# Patient Record
Sex: Female | Born: 1965 | Race: White | Hispanic: No | Smoking: Former smoker
Health system: Southern US, Community
[De-identification: ages and names within clinical notes are randomized; demographics above are authoritative.]

## PROBLEM LIST (undated history)

## (undated) DIAGNOSIS — Z6824 Body mass index (BMI) 24.0-24.9, adult: Secondary | ICD-10-CM

## (undated) DIAGNOSIS — G35D Multiple sclerosis, unspecified: Secondary | ICD-10-CM

## (undated) DIAGNOSIS — F329 Major depressive disorder, single episode, unspecified: Secondary | ICD-10-CM

## (undated) DIAGNOSIS — G35 Multiple sclerosis: Secondary | ICD-10-CM

## (undated) DIAGNOSIS — F32A Anxiety disorder, unspecified: Secondary | ICD-10-CM

## (undated) DIAGNOSIS — F419 Anxiety disorder, unspecified: Secondary | ICD-10-CM

## (undated) HISTORY — PX: TONSILLECTOMY AND ADENOIDECTOMY: SHX28

## (undated) HISTORY — DX: Major depressive disorder, single episode, unspecified: F32.9

## (undated) HISTORY — DX: Multiple sclerosis, unspecified: G35.D

## (undated) HISTORY — DX: Multiple sclerosis: G35

## (undated) HISTORY — DX: Anxiety disorder, unspecified: F41.9

## (undated) HISTORY — DX: Body mass index (BMI) 24.0-24.9, adult: Z68.24

## (undated) HISTORY — DX: Depression, unspecified: F32.A

---

## 2002-11-26 ENCOUNTER — Encounter: Payer: Self-pay | Admitting: Emergency Medicine

## 2002-11-26 ENCOUNTER — Observation Stay (HOSPITAL_COMMUNITY): Admission: EM | Admit: 2002-11-26 | Discharge: 2002-11-27 | Payer: Self-pay | Admitting: Emergency Medicine

## 2003-09-20 ENCOUNTER — Ambulatory Visit (HOSPITAL_COMMUNITY): Admission: RE | Admit: 2003-09-20 | Discharge: 2003-09-20 | Payer: Self-pay | Admitting: Psychiatry

## 2003-11-07 ENCOUNTER — Ambulatory Visit (HOSPITAL_COMMUNITY): Admission: RE | Admit: 2003-11-07 | Discharge: 2003-11-07 | Payer: Self-pay | Admitting: Internal Medicine

## 2003-12-19 ENCOUNTER — Encounter (HOSPITAL_COMMUNITY): Admission: RE | Admit: 2003-12-19 | Discharge: 2004-01-18 | Payer: Self-pay | Admitting: Orthopedic Surgery

## 2004-01-21 ENCOUNTER — Encounter (HOSPITAL_COMMUNITY): Admission: RE | Admit: 2004-01-21 | Discharge: 2004-02-20 | Payer: Self-pay | Admitting: Orthopedic Surgery

## 2004-09-11 ENCOUNTER — Ambulatory Visit (HOSPITAL_COMMUNITY): Admission: AD | Admit: 2004-09-11 | Discharge: 2004-09-11 | Payer: Self-pay | Admitting: Obstetrics and Gynecology

## 2004-09-28 ENCOUNTER — Inpatient Hospital Stay (HOSPITAL_COMMUNITY): Admission: RE | Admit: 2004-09-28 | Discharge: 2004-10-01 | Payer: Self-pay | Admitting: Obstetrics and Gynecology

## 2007-09-30 ENCOUNTER — Ambulatory Visit: Payer: Self-pay | Admitting: Family Medicine

## 2007-09-30 LAB — CONVERTED CEMR LAB: Rapid Strep: NEGATIVE

## 2007-10-14 ENCOUNTER — Ambulatory Visit: Payer: Self-pay | Admitting: Family Medicine

## 2007-10-14 DIAGNOSIS — J309 Allergic rhinitis, unspecified: Secondary | ICD-10-CM | POA: Insufficient documentation

## 2008-09-11 ENCOUNTER — Emergency Department (HOSPITAL_COMMUNITY): Admission: EM | Admit: 2008-09-11 | Discharge: 2008-09-11 | Payer: Self-pay | Admitting: Emergency Medicine

## 2009-09-11 ENCOUNTER — Ambulatory Visit (HOSPITAL_COMMUNITY): Payer: Self-pay | Admitting: Neurology

## 2009-09-11 ENCOUNTER — Encounter (HOSPITAL_COMMUNITY): Admission: RE | Admit: 2009-09-11 | Discharge: 2009-10-11 | Payer: Self-pay | Admitting: Neurology

## 2010-07-16 ENCOUNTER — Encounter: Admission: RE | Admit: 2010-07-16 | Discharge: 2010-07-16 | Payer: Self-pay | Admitting: Internal Medicine

## 2010-07-30 ENCOUNTER — Encounter
Admission: RE | Admit: 2010-07-30 | Discharge: 2010-07-30 | Payer: Self-pay | Source: Home / Self Care | Attending: Internal Medicine | Admitting: Internal Medicine

## 2010-09-07 ENCOUNTER — Encounter: Payer: Self-pay | Admitting: Internal Medicine

## 2010-11-14 ENCOUNTER — Ambulatory Visit (INDEPENDENT_AMBULATORY_CARE_PROVIDER_SITE_OTHER): Payer: Medicare PPO | Admitting: Urology

## 2010-11-14 DIAGNOSIS — N390 Urinary tract infection, site not specified: Secondary | ICD-10-CM

## 2010-11-14 DIAGNOSIS — N3941 Urge incontinence: Secondary | ICD-10-CM

## 2010-11-14 DIAGNOSIS — N3944 Nocturnal enuresis: Secondary | ICD-10-CM

## 2010-11-14 DIAGNOSIS — R339 Retention of urine, unspecified: Secondary | ICD-10-CM

## 2010-12-05 ENCOUNTER — Ambulatory Visit (INDEPENDENT_AMBULATORY_CARE_PROVIDER_SITE_OTHER): Payer: Medicare PPO | Admitting: Urology

## 2010-12-05 DIAGNOSIS — N319 Neuromuscular dysfunction of bladder, unspecified: Secondary | ICD-10-CM

## 2010-12-05 DIAGNOSIS — N3941 Urge incontinence: Secondary | ICD-10-CM

## 2010-12-05 DIAGNOSIS — N3944 Nocturnal enuresis: Secondary | ICD-10-CM

## 2010-12-05 DIAGNOSIS — N390 Urinary tract infection, site not specified: Secondary | ICD-10-CM

## 2011-01-02 NOTE — H&P (Signed)
Erin Berg, Erin Berg              ACCOUNT NO.:  [id number]   MEDICAL RECORD NO.:  [id number]          PATIENT TYPE:  INP   LOCATION:  LDRP                          FACILITY:  [facility]   PHYSICIAN:  Tilda Burrow, M.D. DATE OF BIRTH:  [birth year]   DATE OF ADMISSION:  09/28/2004  DATE OF DISCHARGE:  LH                                HISTORY & PHYSICAL   ADMISSION DIAGNOSES:  1.  Pregnancy, [redacted] weeks gestation.  2.  Cervical favorability.  3.  Multiple sclerosis with subsequent exhaustion in pregnancy.   HISTORY OF PRESENT ILLNESS:  This is a 45 year old female, gravida 3, para  2, AB 0, LMP of January 20, 2004, placing menstrual EDC of October 28, 2004, with  ultrasound corrected [facility] of October 03, 2004, based on 20-week ultrasound.  She was admitted at [redacted] weeks gestation by that criteria after the pregnancy  course was followed through our office and notable for blood type O  negative, receiving RhoGAM at 28 weeks, rubella immunity present, hemoglobin  12, hematocrit 39 and hepatis, HIV, GC, chlamydia and RPR all negative.  The  patient was admitted for labor management and induction with Foley bulb  ripening of the cervix on Sunday night and Pitocin induction in a.m.   PRENATAL PREGNANCY COURSE:  Two 12-hour deliveries in the past.  She was  induced with her second child in 61.   PAST MEDICAL HISTORY:  Multiple sclerosis with initial diagnosis in 1996 and  flare up in 1998 and 2004.   PAST SURGICAL HISTORY:  Tonsillectomy in the first grade.   ALLERGIES:  SULFA causes a rash, not severe in nature.   PHYSICAL EXAMINATION:  GENERAL APPEARANCE:  A healthy-appearing Caucasian  female with obvious gait abnormalities related to her multiple sclerosis.  HEENT:  Pupils equal, round and reactive.  Extraocular movements intact.  NECK:  Supple.  ABDOMEN:  41 cm fundal height.  Vertex presentation.  Estimated fetal weight  8 pounds.  PELVIC:  Cervix 1 cm, 20%, soft, -2, vertex and well  applied.   PLAN:  Balloon cervical ripening on September 28, 2004, and Pitocin induction  on September 29, 2004.      JVF/MEDQ  D:  09/23/2004  T:  09/23/2004  Job:  [id number]   cc:   Corrie Mckusick, M.D.  Fax: [phone]

## 2011-01-02 NOTE — Op Note (Signed)
Erin Berg, Erin Berg NO.:  [id number]   MEDICAL RECORD NO.:  [id number]          PATIENT TYPE:  INP   LOCATION:  LDR1                          FACILITY:  [facility]   PHYSICIAN:  Tilda Burrow, M.D. DATE OF BIRTH:  05-29-1966   DATE OF PROCEDURE:  09/29/2004  DATE OF DISCHARGE:                                 OPERATIVE REPORT   1.  Onset of labor September 29, 2004, 9 a.m.  2.  Date of delivery September 29, 2004, at 1430.  3.  Length of first stage of labor 5 hours and 15 minutes.  4.  Length of second stage of labor 15 minutes.  5.  Length of third stage labor 10 minutes.   DELIVERY NOTE:  [name] had a vacuum-assisted delivery with a Kiwi VAC to  the green marker x 2 uterine contractions due to the fact that she has  multiple sclerosis and has inability to push x 2 contractions with  spontaneous delivery of head without difficulty.  Upon delivery of head,  there was spontaneous delivery of shoulders without complications.  On  delivery of infant, cord was clamped, suctioned thoroughly.  Cord was cut,  infant dried, and placed on mother's abdomen for infant care.  Apgars were  9/9.  Upon inspection, there was a second degree perineal laceration noted  which was infiltrated with 20 mL 1% lidocaine as local anesthetic.  Third  stage of labor was actually managed with 20 units of Pitocin 1000 mL D5  lactated Ringers at a rapid rate.  Placenta was delivered spontaneously via  _crede- mechanism.  Upon inspection, membranes were noted to be intact.  Three-vessel cord noted.  Perineum was repaired with two interrupted sutures  and closed with subcuticular, using 2-0 Vicryl for both.  Good hemostasis  was obtained.  Estimated blood loss was approximately 400 mL  One ampule of  Hemabate was given for uterine atony with good uterine tone obtained and  lochia was small to moderate with good tone.  Infant and mother both  stabilized in good condition, transferred out the post  floor unit.      DL/MEDQ  D:  16/05/9603  T:  09/29/2004  Job:  [id number]

## 2011-01-02 NOTE — H&P (Signed)
Erin Berg, Erin Berg NO.:  [id number]   MEDICAL RECORD NO.:  [id number]          PATIENT TYPE:  INP   LOCATION:  LDR1                          FACILITY:  [facility]   PHYSICIAN:  Tilda Burrow, M.D. DATE OF BIRTH:  [birth year]   DATE OF ADMISSION:  09/28/2004  DATE OF DISCHARGE:  LH                                HISTORY & PHYSICAL   ADMISSION DIAGNOSIS:  1.  Pregnancy at 35-1/2 weeks' gestation.  2.  Elective induction of labor at patient request.   HISTORY OF PRESENT ILLNESS:  This 45 year old female, gravida 3, para 2, AB0  with pregnancy notable for increased discomfort and gait limitations  complicating the pregnancy due to her multiple sclerosis and depression is  admitted at 39-1/2 weeks' gestation for induction of labor.  She has been  seen in our office through the pregnancy with straight forward pregnancy  course with appropriate weight gain of 19 pounds, normal blood pressures,  normal fetal growth.  She is admitted for Foley bulb cervical ripening and  Pitocin induction of labor.  Prior pregnancy was induced using Cytotec  balloon.  Foley bulb has been placed without difficulty.  Vertex  presentation confirmed.  Fetal heart rate is trace and is reactive.  Pitocin  is planned for the a.m.   PAST MEDICAL HISTORY:  Positive for multiple sclerosis since 1996.  Flareups  in 1998 and 2004.   PAST SURGICAL HISTORY:  Tonsillectomy and adenoidectomy in the first grade.   ALLERGIES:  SULFA causing a severe rash in 1998.  PENICILLIN.   MEDICATIONS:  Prenatal vitamins and iron are taken.  She quit Betaseron  after breastfeeding.  She has taken Zoloft in the past with good effect.  Not currently on it for depression.   SOCIAL HISTORY:  Married.  Stable relationship.   PHYSICAL EXAMINATION:  GENERAL:  The physical examination reveals a healthy-  appearing Caucasian female, alert and oriented x3.  HEENT:  Pupils are equal, round and reactive.  Extraocular  movements are  intact.  NECK:  Supple.  Trachea midline.  CHEST:  Clear to auscultation.  ABDOMEN:  Nontender.  There is a 41 cm fundal height.  Estimated fetal  weight is 8 to 8-1/2 pounds.  PELVIC:  Cervix 1-2 cm, 20%, soft, -2 vertex with balloons having been  placed without difficulty.   PRENATAL LABORATORY DATA:  Blood type O negative, rubella immune.  Hemoglobin 12, hematocrit 39.  Hepatitis, HIV, RPR, GC and Chlamydia are all  negative.  MSAP normal.  Glucose challenge test 135 mg%.   PLAN:  Pitocin induction of labor to begin in the a.m.      JVF/MEDQ  D:  09/28/2004  T:  09/28/2004  Job:  [id number]   cc:   [facility]   Corrie Mckusick, M.D.  Fax: [phone]

## 2011-01-02 NOTE — Discharge Summary (Signed)
   Erin Berg, Erin Berg                          ACCOUNT NO.:  [id number]   MEDICAL RECORD NO.:  [id number]                   PATIENT TYPE:  INP   LOCATION:  A311                                 FACILITY:  [facility]   PHYSICIAN:  Patrica Duel, M.D.                 DATE OF BIRTH:  1965-09-30   DATE OF ADMISSION:  11/26/2002  DATE OF DISCHARGE:  11/27/2002                                 DISCHARGE SUMMARY   ADMISSION HISTORY AND PHYSICAL/DISCHARGE SUMMARY   DISCHARGE DIAGNOSES:  1. Paresthesias and weakness of right lower extremity probably secondary to     multiple sclerosis, completely resolved.  2. Urinary tract infection, possibly contributing to exacerbation of     multiple sclerosis.   HISTORY OF PRESENT ILLNESS:  This is a 45 year old female with an eight-year  history of multiple sclerosis.  Her major symptoms have been weakness of the  left lower extremity on an intermittent basis.   The patient presented to the emergency department after a relatively sudden  onset of weakness of the right lower extremity.  This occurred while sitting  in church.  She experienced no other neurologic deficits such as aphasia,  facial droop, upper extremity weakness, headache, nausea, vomiting,  diarrhea, chest pain, shortness of breath, or genitourinary symptoms.   The patient went to the emergency department for workup which was  essentially benign except for urinary tract infection.  The patient's  neurologist was contacted, who suggested a brief admission for two doses of  high-dose Solu-Medrol.   Of note, prior to being seen by the ER physician, the patient's symptoms had  resolved.   COURSE IN THE HOSPITAL:  The patient did well in the hospital with no  significant problems.  Her symptoms have completely resolved and her  neurologic is nonfocal.  Her physical exam is totally benign.  She has no  carotid bruits, heart murmurs, and her vital signs are stable.   The patient is strongly  requesting discharge and I feel this is appropriate.  She will be followed by neurology.   DISPOSITION:  Medications include:  1. Prednisone Dosepak 5 mg x12 days.  2. Baclofen 20 mg t.i.d.  3. Betaseron 0.3 mg subcu every-other day.                                               Patrica Duel, M.D.    MC/MEDQ  D:  11/27/2002  T:  11/27/2002  Job:  [id number]

## 2011-02-12 ENCOUNTER — Other Ambulatory Visit: Payer: Self-pay | Admitting: Neurology

## 2011-02-12 DIAGNOSIS — N319 Neuromuscular dysfunction of bladder, unspecified: Secondary | ICD-10-CM

## 2011-02-12 DIAGNOSIS — G35 Multiple sclerosis: Secondary | ICD-10-CM

## 2011-02-12 DIAGNOSIS — R269 Unspecified abnormalities of gait and mobility: Secondary | ICD-10-CM

## 2011-02-20 ENCOUNTER — Ambulatory Visit
Admission: RE | Admit: 2011-02-20 | Discharge: 2011-02-20 | Disposition: A | Discharge status: Home / Self Care | Payer: Medicare PPO | Source: Ambulatory Visit | Attending: Neurology | Admitting: Neurology

## 2011-02-20 DIAGNOSIS — R269 Unspecified abnormalities of gait and mobility: Secondary | ICD-10-CM

## 2011-02-20 DIAGNOSIS — G35 Multiple sclerosis: Secondary | ICD-10-CM

## 2011-02-20 DIAGNOSIS — N319 Neuromuscular dysfunction of bladder, unspecified: Secondary | ICD-10-CM

## 2011-02-20 MED ORDER — GADOBENATE DIMEGLUMINE 529 MG/ML IV SOLN
14.0000 mL | Freq: Once | INTRAVENOUS | Status: AC | PRN
  Administered 2011-02-20: 14 mL via INTRAVENOUS

## 2011-03-06 ENCOUNTER — Ambulatory Visit: Payer: Medicare PPO | Admitting: Urology

## 2011-04-03 ENCOUNTER — Ambulatory Visit (INDEPENDENT_AMBULATORY_CARE_PROVIDER_SITE_OTHER): Payer: Medicare PPO | Admitting: Urology

## 2011-04-03 DIAGNOSIS — N319 Neuromuscular dysfunction of bladder, unspecified: Secondary | ICD-10-CM

## 2011-04-03 DIAGNOSIS — N3944 Nocturnal enuresis: Secondary | ICD-10-CM

## 2011-04-03 DIAGNOSIS — N3941 Urge incontinence: Secondary | ICD-10-CM

## 2011-06-22 ENCOUNTER — Other Ambulatory Visit: Payer: Self-pay | Admitting: Internal Medicine

## 2011-06-22 DIAGNOSIS — Z1231 Encounter for screening mammogram for malignant neoplasm of breast: Secondary | ICD-10-CM

## 2011-07-03 ENCOUNTER — Ambulatory Visit: Payer: Medicare PPO | Admitting: Urology

## 2011-07-30 ENCOUNTER — Ambulatory Visit
Admission: RE | Admit: 2011-07-30 | Discharge: 2011-07-30 | Disposition: A | Discharge status: Home / Self Care | Payer: Medicare PPO | Source: Ambulatory Visit | Attending: Internal Medicine | Admitting: Internal Medicine

## 2011-07-30 DIAGNOSIS — Z1231 Encounter for screening mammogram for malignant neoplasm of breast: Secondary | ICD-10-CM

## 2011-08-05 ENCOUNTER — Other Ambulatory Visit: Payer: Self-pay | Admitting: Internal Medicine

## 2011-08-05 DIAGNOSIS — R928 Other abnormal and inconclusive findings on diagnostic imaging of breast: Secondary | ICD-10-CM

## 2011-08-14 ENCOUNTER — Ambulatory Visit (INDEPENDENT_AMBULATORY_CARE_PROVIDER_SITE_OTHER): Payer: Medicare PPO | Admitting: Urology

## 2011-08-14 DIAGNOSIS — N319 Neuromuscular dysfunction of bladder, unspecified: Secondary | ICD-10-CM

## 2011-08-14 DIAGNOSIS — N3941 Urge incontinence: Secondary | ICD-10-CM

## 2011-08-14 DIAGNOSIS — R339 Retention of urine, unspecified: Secondary | ICD-10-CM

## 2011-08-25 ENCOUNTER — Ambulatory Visit
Admission: RE | Admit: 2011-08-25 | Discharge: 2011-08-25 | Disposition: A | Discharge status: Home / Self Care | Payer: Medicare HMO | Source: Ambulatory Visit | Attending: Internal Medicine | Admitting: Internal Medicine

## 2011-08-25 DIAGNOSIS — R928 Other abnormal and inconclusive findings on diagnostic imaging of breast: Secondary | ICD-10-CM

## 2011-11-11 ENCOUNTER — Other Ambulatory Visit (HOSPITAL_COMMUNITY): Payer: Medicare HMO

## 2011-11-18 ENCOUNTER — Encounter (HOSPITAL_COMMUNITY): Payer: Medicare HMO | Attending: Neurology

## 2011-11-18 VITALS — BP 127/77 | HR 72 | Temp 97.1°F

## 2011-11-18 DIAGNOSIS — G35 Multiple sclerosis: Secondary | ICD-10-CM | POA: Insufficient documentation

## 2011-11-18 MED ORDER — SODIUM CHLORIDE 0.9 % IV SOLN
Freq: Once | INTRAVENOUS | Status: AC
  Administered 2011-11-18: 12:00:00 via INTRAVENOUS
  Filled 2011-11-18: qty 8

## 2011-11-18 MED ORDER — SODIUM CHLORIDE 0.9 % IV SOLN
INTRAVENOUS | Status: DC
  Administered 2011-11-18: 12:00:00 via INTRAVENOUS

## 2011-11-18 MED ORDER — METHYLPREDNISOLONE SODIUM SUCC 500 MG IJ SOLR: 1000.0000 mg | Freq: Once | INTRAMUSCULAR | Status: DC

## 2011-11-18 MED ORDER — SODIUM CHLORIDE 0.9 % IJ SOLN
10.0000 mL | INTRAMUSCULAR | Status: DC | PRN
  Administered 2011-11-18: 10 mL via INTRAVENOUS

## 2011-11-18 MED ORDER — METHYLPREDNISOLONE SODIUM SUCC 125 MG IJ SOLR: 1000.0000 mg | INTRAMUSCULAR | Status: DC

## 2011-11-18 NOTE — Progress Notes (Signed)
Tolerated infusion well. 

## 2011-11-19 ENCOUNTER — Encounter (HOSPITAL_COMMUNITY): Payer: Medicare HMO

## 2011-11-19 DIAGNOSIS — J309 Allergic rhinitis, unspecified: Secondary | ICD-10-CM

## 2011-11-19 MED ORDER — SODIUM CHLORIDE 0.9 % IV SOLN
INTRAVENOUS | Status: DC
  Administered 2011-11-19: 10:00:00 via INTRAVENOUS

## 2011-11-19 MED ORDER — SODIUM CHLORIDE 0.9 % IV SOLN
Freq: Once | INTRAVENOUS | Status: AC
  Administered 2011-11-19: 11:00:00 via INTRAVENOUS
  Filled 2011-11-19: qty 8

## 2011-11-19 MED ORDER — METHYLPREDNISOLONE SODIUM SUCC 125 MG IJ SOLR: 1000.0000 mg | Freq: Once | INTRAMUSCULAR | Status: DC

## 2011-11-19 NOTE — Progress Notes (Signed)
Solumedrol 1000mg  IV administered over 30 minutes.  Patient tolerated infusion well.  IV wrapped with guaze for subsequent infusions.

## 2011-11-20 ENCOUNTER — Encounter (HOSPITAL_COMMUNITY): Payer: Medicare HMO

## 2011-11-20 VITALS — BP 135/89 | HR 72 | Temp 98.5°F

## 2011-11-20 DIAGNOSIS — G35 Multiple sclerosis: Secondary | ICD-10-CM

## 2011-11-20 MED ORDER — METHYLPREDNISOLONE SODIUM SUCC 125 MG IJ SOLR: 1000.0000 mg | Freq: Once | INTRAMUSCULAR | Status: DC

## 2011-11-20 MED ORDER — SODIUM CHLORIDE 0.9 % IV SOLN
INTRAVENOUS | Status: DC
  Administered 2011-11-20: 250 mL via INTRAVENOUS

## 2011-11-20 MED ORDER — SODIUM CHLORIDE 0.9 % IV SOLN
Freq: Once | INTRAVENOUS | Status: AC
  Administered 2011-11-20: 11:00:00 via INTRAVENOUS
  Filled 2011-11-20: qty 8

## 2011-11-20 NOTE — Progress Notes (Signed)
Erin Berg tolerated infusion well and without incident; verbalizes understanding for follow-up.  No distress noted at time of discharge and patient was discharged home with her mother.  

## 2011-12-24 ENCOUNTER — Other Ambulatory Visit: Payer: Self-pay | Admitting: Urology

## 2011-12-24 DIAGNOSIS — R339 Retention of urine, unspecified: Secondary | ICD-10-CM

## 2012-02-05 ENCOUNTER — Ambulatory Visit (HOSPITAL_COMMUNITY): Payer: Medicare HMO

## 2012-02-12 ENCOUNTER — Ambulatory Visit: Payer: Medicare HMO | Admitting: Urology

## 2012-11-07 ENCOUNTER — Ambulatory Visit (HOSPITAL_COMMUNITY): Payer: Medicare HMO

## 2012-11-08 ENCOUNTER — Encounter (HOSPITAL_COMMUNITY): Payer: Medicare HMO | Attending: Neurology

## 2012-11-08 VITALS — BP 114/63 | HR 67 | Temp 98.3°F | Resp 16

## 2012-11-08 DIAGNOSIS — G35 Multiple sclerosis: Secondary | ICD-10-CM | POA: Insufficient documentation

## 2012-11-08 MED ORDER — SODIUM CHLORIDE 0.9 % IV SOLN
1000.0000 mg | Freq: Once | INTRAVENOUS | Status: AC
  Administered 2012-11-08: 1000 mg via INTRAVENOUS
  Filled 2012-11-08: qty 8

## 2012-11-08 MED ORDER — SODIUM CHLORIDE 0.9 % IJ SOLN
10.0000 mL | INTRAMUSCULAR | Status: DC | PRN
  Administered 2012-11-08: 10 mL via INTRAVENOUS

## 2012-11-08 MED ORDER — SODIUM CHLORIDE 0.9 % IV SOLN
INTRAVENOUS | Status: DC
  Administered 2012-11-08: 11:00:00 via INTRAVENOUS

## 2012-11-08 NOTE — Progress Notes (Signed)
Tolerated solumedrol 1000 mg IV piggyback well.  IV in right arm taped securely and saline locked.

## 2012-11-09 ENCOUNTER — Encounter (HOSPITAL_COMMUNITY): Payer: Medicare HMO

## 2012-11-09 VITALS — BP 119/61 | HR 67 | Temp 98.2°F | Resp 16

## 2012-11-09 DIAGNOSIS — G35 Multiple sclerosis: Secondary | ICD-10-CM

## 2012-11-09 MED ORDER — SODIUM CHLORIDE 0.9 % IV SOLN
1000.0000 mg | Freq: Once | INTRAVENOUS | Status: AC
  Administered 2012-11-09: 1000 mg via INTRAVENOUS
  Filled 2012-11-09: qty 8

## 2012-11-09 MED ORDER — SODIUM CHLORIDE 0.9 % IV SOLN
INTRAVENOUS | Status: DC
  Administered 2012-11-09: 250 mL via INTRAVENOUS

## 2012-11-09 NOTE — Progress Notes (Signed)
Liberty Handy tolerated infusion well and without incident; verbalizes understanding for follow-up.  No distress noted at time of discharge and patient was discharged home with her mother.

## 2012-11-10 ENCOUNTER — Encounter (HOSPITAL_COMMUNITY): Payer: Medicare HMO

## 2012-11-10 VITALS — BP 132/76 | HR 76 | Resp 16

## 2012-11-10 DIAGNOSIS — G35 Multiple sclerosis: Secondary | ICD-10-CM

## 2012-11-10 MED ORDER — SODIUM CHLORIDE 0.9 % IV SOLN
1000.0000 mg | Freq: Once | INTRAVENOUS | Status: AC
  Administered 2012-11-10: 1000 mg via INTRAVENOUS
  Filled 2012-11-10: qty 8

## 2012-11-10 MED ORDER — SODIUM CHLORIDE 0.9 % IV SOLN
INTRAVENOUS | Status: DC
  Administered 2012-11-10: 250 mL via INTRAVENOUS

## 2012-11-10 NOTE — Progress Notes (Signed)
Erin Berg tolerated infusion well and without incident; verbalizes understanding for follow-up.  No distress noted at time of discharge and patient was discharged home with her mother.  

## 2012-11-11 ENCOUNTER — Encounter (HOSPITAL_COMMUNITY): Payer: Medicare HMO

## 2012-11-11 VITALS — BP 131/70 | HR 55 | Temp 98.1°F | Resp 16

## 2012-11-11 DIAGNOSIS — G35 Multiple sclerosis: Secondary | ICD-10-CM

## 2012-11-11 MED ORDER — SODIUM CHLORIDE 0.9 % IV SOLN
Freq: Once | INTRAVENOUS | Status: AC
  Administered 2012-11-11: 11:00:00 via INTRAVENOUS

## 2012-11-11 MED ORDER — SODIUM CHLORIDE 0.9 % IV SOLN
1000.0000 mg | Freq: Once | INTRAVENOUS | Status: AC
  Administered 2012-11-11: 1000 mg via INTRAVENOUS
  Filled 2012-11-11: qty 8

## 2012-11-11 NOTE — Progress Notes (Signed)
Tolerated well

## 2013-05-04 ENCOUNTER — Other Ambulatory Visit: Payer: Self-pay

## 2013-05-04 DIAGNOSIS — Z1231 Encounter for screening mammogram for malignant neoplasm of breast: Secondary | ICD-10-CM

## 2013-05-29 ENCOUNTER — Ambulatory Visit
Admission: RE | Admit: 2013-05-29 | Discharge: 2013-05-29 | Disposition: A | Discharge status: Home / Self Care | Payer: Medicare HMO | Source: Ambulatory Visit

## 2013-05-29 DIAGNOSIS — Z1231 Encounter for screening mammogram for malignant neoplasm of breast: Secondary | ICD-10-CM

## 2014-02-28 ENCOUNTER — Other Ambulatory Visit: Payer: Self-pay | Admitting: Neurology

## 2014-02-28 DIAGNOSIS — G35 Multiple sclerosis: Secondary | ICD-10-CM

## 2014-03-01 ENCOUNTER — Encounter (HOSPITAL_COMMUNITY)
Admission: RE | Admit: 2014-03-01 | Discharge: 2014-03-01 | Disposition: A | Discharge status: Home / Self Care | Payer: Medicare HMO | Source: Ambulatory Visit | Attending: Neurology | Admitting: Neurology

## 2014-03-01 ENCOUNTER — Encounter (HOSPITAL_COMMUNITY): Payer: Self-pay

## 2014-03-01 DIAGNOSIS — N390 Urinary tract infection, site not specified: Secondary | ICD-10-CM | POA: Diagnosis not present

## 2014-03-01 DIAGNOSIS — Z79899 Other long term (current) drug therapy: Secondary | ICD-10-CM | POA: Insufficient documentation

## 2014-03-01 DIAGNOSIS — G35 Multiple sclerosis: Secondary | ICD-10-CM | POA: Insufficient documentation

## 2014-03-01 DIAGNOSIS — R269 Unspecified abnormalities of gait and mobility: Secondary | ICD-10-CM | POA: Insufficient documentation

## 2014-03-01 HISTORY — DX: Multiple sclerosis: G35

## 2014-03-01 HISTORY — DX: Multiple sclerosis, unspecified: G35.D

## 2014-03-01 MED ORDER — SODIUM CHLORIDE 0.9 % IV SOLN
INTRAVENOUS | Status: DC
  Administered 2014-03-01: 13:00:00 via INTRAVENOUS

## 2014-03-01 MED ORDER — SODIUM CHLORIDE 0.9 % IV SOLN
Freq: Once | INTRAVENOUS | Status: AC
  Administered 2014-03-01: 14:00:00 via INTRAVENOUS
  Filled 2014-03-01: qty 8

## 2014-03-01 MED ORDER — METHYLPREDNISOLONE SODIUM SUCC 125 MG IJ SOLR: 1000.0000 mg | Freq: Once | INTRAMUSCULAR | Status: DC

## 2014-03-02 ENCOUNTER — Encounter (HOSPITAL_COMMUNITY)
Admission: RE | Admit: 2014-03-02 | Discharge: 2014-03-02 | Disposition: A | Discharge status: Home / Self Care | Payer: Medicare HMO | Source: Ambulatory Visit | Attending: Neurology | Admitting: Neurology

## 2014-03-02 DIAGNOSIS — G35 Multiple sclerosis: Secondary | ICD-10-CM | POA: Diagnosis not present

## 2014-03-02 MED ORDER — SODIUM CHLORIDE 0.9 % IV SOLN
INTRAVENOUS | Status: DC
  Administered 2014-03-02: 13:00:00 via INTRAVENOUS

## 2014-03-02 MED ORDER — SODIUM CHLORIDE 0.9 % IV SOLN
1000.0000 mg | Freq: Once | INTRAVENOUS | Status: AC
  Administered 2014-03-02: 1000 mg via INTRAVENOUS
  Filled 2014-03-02: qty 8

## 2014-03-03 ENCOUNTER — Encounter (HOSPITAL_COMMUNITY)
Admission: RE | Admit: 2014-03-03 | Discharge: 2014-03-03 | Disposition: A | Discharge status: Home / Self Care | Payer: Medicare HMO | Source: Ambulatory Visit | Attending: Neurology | Admitting: Neurology

## 2014-03-03 DIAGNOSIS — G35 Multiple sclerosis: Secondary | ICD-10-CM | POA: Diagnosis not present

## 2014-03-03 MED ORDER — SODIUM CHLORIDE 0.9 % IV SOLN
1000.0000 mg | Freq: Once | INTRAVENOUS | Status: DC
  Filled 2014-03-03: qty 8

## 2014-03-14 ENCOUNTER — Ambulatory Visit (HOSPITAL_COMMUNITY)
Admission: RE | Admit: 2014-03-14 | Discharge: 2014-03-14 | Disposition: A | Discharge status: Home / Self Care | Payer: Medicare HMO | Source: Ambulatory Visit | Attending: Neurology | Admitting: Neurology

## 2014-03-14 DIAGNOSIS — G35 Multiple sclerosis: Secondary | ICD-10-CM

## 2014-03-14 MED ORDER — GADOBENATE DIMEGLUMINE 529 MG/ML IV SOLN
14.0000 mL | Freq: Once | INTRAVENOUS | Status: AC | PRN
  Administered 2014-03-14: 14 mL via INTRAVENOUS

## 2014-04-26 ENCOUNTER — Other Ambulatory Visit: Payer: Self-pay

## 2014-04-26 DIAGNOSIS — Z1231 Encounter for screening mammogram for malignant neoplasm of breast: Secondary | ICD-10-CM

## 2014-05-30 ENCOUNTER — Encounter (INDEPENDENT_AMBULATORY_CARE_PROVIDER_SITE_OTHER): Payer: Self-pay

## 2014-05-30 ENCOUNTER — Ambulatory Visit
Admission: RE | Admit: 2014-05-30 | Discharge: 2014-05-30 | Disposition: A | Discharge status: Home / Self Care | Payer: Commercial Managed Care - HMO | Source: Ambulatory Visit

## 2014-05-30 DIAGNOSIS — Z1231 Encounter for screening mammogram for malignant neoplasm of breast: Secondary | ICD-10-CM

## 2014-08-17 DIAGNOSIS — G35 Multiple sclerosis: Secondary | ICD-10-CM | POA: Diagnosis not present

## 2014-08-18 DIAGNOSIS — G35 Multiple sclerosis: Secondary | ICD-10-CM | POA: Diagnosis not present

## 2014-08-20 DIAGNOSIS — G35 Multiple sclerosis: Secondary | ICD-10-CM | POA: Diagnosis not present

## 2014-08-21 DIAGNOSIS — G35 Multiple sclerosis: Secondary | ICD-10-CM | POA: Diagnosis not present

## 2014-08-22 DIAGNOSIS — G35 Multiple sclerosis: Secondary | ICD-10-CM | POA: Diagnosis not present

## 2014-08-23 DIAGNOSIS — G35 Multiple sclerosis: Secondary | ICD-10-CM | POA: Diagnosis not present

## 2014-08-24 DIAGNOSIS — G35 Multiple sclerosis: Secondary | ICD-10-CM | POA: Diagnosis not present

## 2014-08-25 DIAGNOSIS — G35 Multiple sclerosis: Secondary | ICD-10-CM | POA: Diagnosis not present

## 2014-08-27 DIAGNOSIS — G35 Multiple sclerosis: Secondary | ICD-10-CM | POA: Diagnosis not present

## 2014-08-28 DIAGNOSIS — G35 Multiple sclerosis: Secondary | ICD-10-CM | POA: Diagnosis not present

## 2014-08-29 DIAGNOSIS — G35 Multiple sclerosis: Secondary | ICD-10-CM | POA: Diagnosis not present

## 2014-08-30 DIAGNOSIS — G35 Multiple sclerosis: Secondary | ICD-10-CM | POA: Diagnosis not present

## 2014-08-31 DIAGNOSIS — G35 Multiple sclerosis: Secondary | ICD-10-CM | POA: Diagnosis not present

## 2014-09-01 DIAGNOSIS — G35 Multiple sclerosis: Secondary | ICD-10-CM | POA: Diagnosis not present

## 2014-09-03 DIAGNOSIS — G35 Multiple sclerosis: Secondary | ICD-10-CM | POA: Diagnosis not present

## 2014-09-04 DIAGNOSIS — G35 Multiple sclerosis: Secondary | ICD-10-CM | POA: Diagnosis not present

## 2014-09-05 DIAGNOSIS — G35 Multiple sclerosis: Secondary | ICD-10-CM | POA: Diagnosis not present

## 2014-09-06 DIAGNOSIS — G35 Multiple sclerosis: Secondary | ICD-10-CM | POA: Diagnosis not present

## 2014-09-10 DIAGNOSIS — G35 Multiple sclerosis: Secondary | ICD-10-CM | POA: Diagnosis not present

## 2014-09-11 DIAGNOSIS — G35 Multiple sclerosis: Secondary | ICD-10-CM | POA: Diagnosis not present

## 2014-09-12 DIAGNOSIS — G35 Multiple sclerosis: Secondary | ICD-10-CM | POA: Diagnosis not present

## 2014-09-13 DIAGNOSIS — G35 Multiple sclerosis: Secondary | ICD-10-CM | POA: Diagnosis not present

## 2014-09-14 DIAGNOSIS — G35 Multiple sclerosis: Secondary | ICD-10-CM | POA: Diagnosis not present

## 2014-09-15 DIAGNOSIS — G35 Multiple sclerosis: Secondary | ICD-10-CM | POA: Diagnosis not present

## 2014-09-17 DIAGNOSIS — G35 Multiple sclerosis: Secondary | ICD-10-CM | POA: Diagnosis not present

## 2014-09-18 DIAGNOSIS — G35 Multiple sclerosis: Secondary | ICD-10-CM | POA: Diagnosis not present

## 2014-09-19 DIAGNOSIS — G35 Multiple sclerosis: Secondary | ICD-10-CM | POA: Diagnosis not present

## 2014-09-20 DIAGNOSIS — G35 Multiple sclerosis: Secondary | ICD-10-CM | POA: Diagnosis not present

## 2014-09-21 DIAGNOSIS — G35 Multiple sclerosis: Secondary | ICD-10-CM | POA: Diagnosis not present

## 2014-09-24 DIAGNOSIS — G35 Multiple sclerosis: Secondary | ICD-10-CM | POA: Diagnosis not present

## 2014-09-25 DIAGNOSIS — G35 Multiple sclerosis: Secondary | ICD-10-CM | POA: Diagnosis not present

## 2014-09-26 DIAGNOSIS — G35 Multiple sclerosis: Secondary | ICD-10-CM | POA: Diagnosis not present

## 2014-09-27 DIAGNOSIS — G35 Multiple sclerosis: Secondary | ICD-10-CM | POA: Diagnosis not present

## 2014-09-28 DIAGNOSIS — G35 Multiple sclerosis: Secondary | ICD-10-CM | POA: Diagnosis not present

## 2014-09-29 DIAGNOSIS — G35 Multiple sclerosis: Secondary | ICD-10-CM | POA: Diagnosis not present

## 2014-10-02 DIAGNOSIS — G35 Multiple sclerosis: Secondary | ICD-10-CM | POA: Diagnosis not present

## 2014-10-03 DIAGNOSIS — F09 Unspecified mental disorder due to known physiological condition: Secondary | ICD-10-CM | POA: Diagnosis not present

## 2014-10-03 DIAGNOSIS — R270 Ataxia, unspecified: Secondary | ICD-10-CM | POA: Diagnosis not present

## 2014-10-03 DIAGNOSIS — G35 Multiple sclerosis: Secondary | ICD-10-CM | POA: Diagnosis not present

## 2014-10-03 DIAGNOSIS — Z79899 Other long term (current) drug therapy: Secondary | ICD-10-CM | POA: Diagnosis not present

## 2014-10-04 DIAGNOSIS — G35 Multiple sclerosis: Secondary | ICD-10-CM | POA: Diagnosis not present

## 2014-10-05 DIAGNOSIS — G35 Multiple sclerosis: Secondary | ICD-10-CM | POA: Diagnosis not present

## 2014-10-06 DIAGNOSIS — G35 Multiple sclerosis: Secondary | ICD-10-CM | POA: Diagnosis not present

## 2014-10-08 DIAGNOSIS — G35 Multiple sclerosis: Secondary | ICD-10-CM | POA: Diagnosis not present

## 2014-10-09 DIAGNOSIS — G35 Multiple sclerosis: Secondary | ICD-10-CM | POA: Diagnosis not present

## 2014-10-10 DIAGNOSIS — G35 Multiple sclerosis: Secondary | ICD-10-CM | POA: Diagnosis not present

## 2014-10-11 DIAGNOSIS — G35 Multiple sclerosis: Secondary | ICD-10-CM | POA: Diagnosis not present

## 2014-10-12 DIAGNOSIS — G35 Multiple sclerosis: Secondary | ICD-10-CM | POA: Diagnosis not present

## 2014-10-13 DIAGNOSIS — G35 Multiple sclerosis: Secondary | ICD-10-CM | POA: Diagnosis not present

## 2014-10-15 DIAGNOSIS — G35 Multiple sclerosis: Secondary | ICD-10-CM | POA: Diagnosis not present

## 2014-10-16 DIAGNOSIS — M810 Age-related osteoporosis without current pathological fracture: Secondary | ICD-10-CM | POA: Diagnosis not present

## 2014-10-16 DIAGNOSIS — Z79899 Other long term (current) drug therapy: Secondary | ICD-10-CM | POA: Diagnosis not present

## 2014-10-16 DIAGNOSIS — E538 Deficiency of other specified B group vitamins: Secondary | ICD-10-CM | POA: Diagnosis not present

## 2014-10-16 DIAGNOSIS — G35 Multiple sclerosis: Secondary | ICD-10-CM | POA: Diagnosis not present

## 2014-10-16 DIAGNOSIS — E559 Vitamin D deficiency, unspecified: Secondary | ICD-10-CM | POA: Diagnosis not present

## 2014-10-16 DIAGNOSIS — R5383 Other fatigue: Secondary | ICD-10-CM | POA: Diagnosis not present

## 2014-10-17 DIAGNOSIS — G35 Multiple sclerosis: Secondary | ICD-10-CM | POA: Diagnosis not present

## 2014-10-18 DIAGNOSIS — G35 Multiple sclerosis: Secondary | ICD-10-CM | POA: Diagnosis not present

## 2014-10-19 DIAGNOSIS — G35 Multiple sclerosis: Secondary | ICD-10-CM | POA: Diagnosis not present

## 2014-10-22 DIAGNOSIS — G35 Multiple sclerosis: Secondary | ICD-10-CM | POA: Diagnosis not present

## 2014-10-23 DIAGNOSIS — G35 Multiple sclerosis: Secondary | ICD-10-CM | POA: Diagnosis not present

## 2014-10-24 DIAGNOSIS — G35 Multiple sclerosis: Secondary | ICD-10-CM | POA: Diagnosis not present

## 2014-10-25 DIAGNOSIS — G35 Multiple sclerosis: Secondary | ICD-10-CM | POA: Diagnosis not present

## 2014-10-26 DIAGNOSIS — G35 Multiple sclerosis: Secondary | ICD-10-CM | POA: Diagnosis not present

## 2014-10-27 DIAGNOSIS — G35 Multiple sclerosis: Secondary | ICD-10-CM | POA: Diagnosis not present

## 2014-10-29 DIAGNOSIS — G35 Multiple sclerosis: Secondary | ICD-10-CM | POA: Diagnosis not present

## 2014-10-30 DIAGNOSIS — G35 Multiple sclerosis: Secondary | ICD-10-CM | POA: Diagnosis not present

## 2014-10-31 DIAGNOSIS — G35 Multiple sclerosis: Secondary | ICD-10-CM | POA: Diagnosis not present

## 2014-11-01 DIAGNOSIS — G35 Multiple sclerosis: Secondary | ICD-10-CM | POA: Diagnosis not present

## 2014-11-02 DIAGNOSIS — G35 Multiple sclerosis: Secondary | ICD-10-CM | POA: Diagnosis not present

## 2014-11-03 DIAGNOSIS — G35 Multiple sclerosis: Secondary | ICD-10-CM | POA: Diagnosis not present

## 2014-11-05 DIAGNOSIS — G35 Multiple sclerosis: Secondary | ICD-10-CM | POA: Diagnosis not present

## 2014-11-06 DIAGNOSIS — G35 Multiple sclerosis: Secondary | ICD-10-CM | POA: Diagnosis not present

## 2014-11-07 DIAGNOSIS — G35 Multiple sclerosis: Secondary | ICD-10-CM | POA: Diagnosis not present

## 2014-11-08 DIAGNOSIS — G35 Multiple sclerosis: Secondary | ICD-10-CM | POA: Diagnosis not present

## 2014-11-09 DIAGNOSIS — G35 Multiple sclerosis: Secondary | ICD-10-CM | POA: Diagnosis not present

## 2014-11-10 DIAGNOSIS — G35 Multiple sclerosis: Secondary | ICD-10-CM | POA: Diagnosis not present

## 2014-11-13 DIAGNOSIS — G35 Multiple sclerosis: Secondary | ICD-10-CM | POA: Diagnosis not present

## 2014-11-14 DIAGNOSIS — G35 Multiple sclerosis: Secondary | ICD-10-CM | POA: Diagnosis not present

## 2014-11-15 DIAGNOSIS — G35 Multiple sclerosis: Secondary | ICD-10-CM | POA: Diagnosis not present

## 2014-11-16 DIAGNOSIS — G35 Multiple sclerosis: Secondary | ICD-10-CM | POA: Diagnosis not present

## 2014-11-20 DIAGNOSIS — G35 Multiple sclerosis: Secondary | ICD-10-CM | POA: Diagnosis not present

## 2014-11-22 DIAGNOSIS — G35 Multiple sclerosis: Secondary | ICD-10-CM | POA: Diagnosis not present

## 2014-11-23 DIAGNOSIS — G35 Multiple sclerosis: Secondary | ICD-10-CM | POA: Diagnosis not present

## 2014-11-26 DIAGNOSIS — G35 Multiple sclerosis: Secondary | ICD-10-CM | POA: Diagnosis not present

## 2014-11-27 DIAGNOSIS — G35 Multiple sclerosis: Secondary | ICD-10-CM | POA: Diagnosis not present

## 2014-11-28 DIAGNOSIS — G35 Multiple sclerosis: Secondary | ICD-10-CM | POA: Diagnosis not present

## 2014-11-29 DIAGNOSIS — G35 Multiple sclerosis: Secondary | ICD-10-CM | POA: Diagnosis not present

## 2014-11-30 DIAGNOSIS — G35 Multiple sclerosis: Secondary | ICD-10-CM | POA: Diagnosis not present

## 2014-12-01 DIAGNOSIS — G35 Multiple sclerosis: Secondary | ICD-10-CM | POA: Diagnosis not present

## 2014-12-03 DIAGNOSIS — G35 Multiple sclerosis: Secondary | ICD-10-CM | POA: Diagnosis not present

## 2014-12-04 DIAGNOSIS — G35 Multiple sclerosis: Secondary | ICD-10-CM | POA: Diagnosis not present

## 2014-12-05 DIAGNOSIS — G35 Multiple sclerosis: Secondary | ICD-10-CM | POA: Diagnosis not present

## 2014-12-06 DIAGNOSIS — G35 Multiple sclerosis: Secondary | ICD-10-CM | POA: Diagnosis not present

## 2014-12-07 DIAGNOSIS — G35 Multiple sclerosis: Secondary | ICD-10-CM | POA: Diagnosis not present

## 2014-12-08 DIAGNOSIS — G35 Multiple sclerosis: Secondary | ICD-10-CM | POA: Diagnosis not present

## 2014-12-10 DIAGNOSIS — G35 Multiple sclerosis: Secondary | ICD-10-CM | POA: Diagnosis not present

## 2014-12-11 DIAGNOSIS — G35 Multiple sclerosis: Secondary | ICD-10-CM | POA: Diagnosis not present

## 2014-12-12 DIAGNOSIS — G35 Multiple sclerosis: Secondary | ICD-10-CM | POA: Diagnosis not present

## 2014-12-13 DIAGNOSIS — G35 Multiple sclerosis: Secondary | ICD-10-CM | POA: Diagnosis not present

## 2014-12-14 DIAGNOSIS — G35 Multiple sclerosis: Secondary | ICD-10-CM | POA: Diagnosis not present

## 2014-12-15 DIAGNOSIS — G35 Multiple sclerosis: Secondary | ICD-10-CM | POA: Diagnosis not present

## 2014-12-17 DIAGNOSIS — G35 Multiple sclerosis: Secondary | ICD-10-CM | POA: Diagnosis not present

## 2014-12-18 DIAGNOSIS — G35 Multiple sclerosis: Secondary | ICD-10-CM | POA: Diagnosis not present

## 2014-12-19 DIAGNOSIS — G35 Multiple sclerosis: Secondary | ICD-10-CM | POA: Diagnosis not present

## 2014-12-20 DIAGNOSIS — G35 Multiple sclerosis: Secondary | ICD-10-CM | POA: Diagnosis not present

## 2014-12-21 DIAGNOSIS — G35 Multiple sclerosis: Secondary | ICD-10-CM | POA: Diagnosis not present

## 2014-12-22 DIAGNOSIS — G35 Multiple sclerosis: Secondary | ICD-10-CM | POA: Diagnosis not present

## 2014-12-24 DIAGNOSIS — G35 Multiple sclerosis: Secondary | ICD-10-CM | POA: Diagnosis not present

## 2014-12-25 DIAGNOSIS — G35 Multiple sclerosis: Secondary | ICD-10-CM | POA: Diagnosis not present

## 2014-12-26 DIAGNOSIS — G35 Multiple sclerosis: Secondary | ICD-10-CM | POA: Diagnosis not present

## 2014-12-27 DIAGNOSIS — G35 Multiple sclerosis: Secondary | ICD-10-CM | POA: Diagnosis not present

## 2014-12-29 DIAGNOSIS — G35 Multiple sclerosis: Secondary | ICD-10-CM | POA: Diagnosis not present

## 2014-12-31 DIAGNOSIS — G35 Multiple sclerosis: Secondary | ICD-10-CM | POA: Diagnosis not present

## 2015-01-01 DIAGNOSIS — G35 Multiple sclerosis: Secondary | ICD-10-CM | POA: Diagnosis not present

## 2015-01-02 DIAGNOSIS — G35 Multiple sclerosis: Secondary | ICD-10-CM | POA: Diagnosis not present

## 2015-01-03 DIAGNOSIS — G35 Multiple sclerosis: Secondary | ICD-10-CM | POA: Diagnosis not present

## 2015-01-04 DIAGNOSIS — G35 Multiple sclerosis: Secondary | ICD-10-CM | POA: Diagnosis not present

## 2015-01-05 DIAGNOSIS — G35 Multiple sclerosis: Secondary | ICD-10-CM | POA: Diagnosis not present

## 2015-01-07 DIAGNOSIS — G35 Multiple sclerosis: Secondary | ICD-10-CM | POA: Diagnosis not present

## 2015-01-08 DIAGNOSIS — G35 Multiple sclerosis: Secondary | ICD-10-CM | POA: Diagnosis not present

## 2015-01-09 DIAGNOSIS — G35 Multiple sclerosis: Secondary | ICD-10-CM | POA: Diagnosis not present

## 2015-01-10 DIAGNOSIS — Z681 Body mass index (BMI) 19 or less, adult: Secondary | ICD-10-CM | POA: Diagnosis not present

## 2015-01-10 DIAGNOSIS — G35 Multiple sclerosis: Secondary | ICD-10-CM | POA: Diagnosis not present

## 2015-01-10 DIAGNOSIS — F329 Major depressive disorder, single episode, unspecified: Secondary | ICD-10-CM | POA: Diagnosis not present

## 2015-01-10 DIAGNOSIS — B351 Tinea unguium: Secondary | ICD-10-CM | POA: Diagnosis not present

## 2015-01-12 DIAGNOSIS — G35 Multiple sclerosis: Secondary | ICD-10-CM | POA: Diagnosis not present

## 2015-01-14 DIAGNOSIS — G35 Multiple sclerosis: Secondary | ICD-10-CM | POA: Diagnosis not present

## 2015-01-15 DIAGNOSIS — G35 Multiple sclerosis: Secondary | ICD-10-CM | POA: Diagnosis not present

## 2015-01-16 DIAGNOSIS — G35 Multiple sclerosis: Secondary | ICD-10-CM | POA: Diagnosis not present

## 2015-01-17 DIAGNOSIS — G35 Multiple sclerosis: Secondary | ICD-10-CM | POA: Diagnosis not present

## 2015-01-18 DIAGNOSIS — G35 Multiple sclerosis: Secondary | ICD-10-CM | POA: Diagnosis not present

## 2015-01-19 DIAGNOSIS — G35 Multiple sclerosis: Secondary | ICD-10-CM | POA: Diagnosis not present

## 2015-01-21 DIAGNOSIS — G35 Multiple sclerosis: Secondary | ICD-10-CM | POA: Diagnosis not present

## 2015-01-22 DIAGNOSIS — G35 Multiple sclerosis: Secondary | ICD-10-CM | POA: Diagnosis not present

## 2015-01-23 DIAGNOSIS — G35 Multiple sclerosis: Secondary | ICD-10-CM | POA: Diagnosis not present

## 2015-01-24 DIAGNOSIS — G35 Multiple sclerosis: Secondary | ICD-10-CM | POA: Diagnosis not present

## 2015-01-25 DIAGNOSIS — G35 Multiple sclerosis: Secondary | ICD-10-CM | POA: Diagnosis not present

## 2015-01-26 DIAGNOSIS — G35 Multiple sclerosis: Secondary | ICD-10-CM | POA: Diagnosis not present

## 2015-01-28 DIAGNOSIS — G35 Multiple sclerosis: Secondary | ICD-10-CM | POA: Diagnosis not present

## 2015-01-29 DIAGNOSIS — G35 Multiple sclerosis: Secondary | ICD-10-CM | POA: Diagnosis not present

## 2015-01-30 DIAGNOSIS — G35 Multiple sclerosis: Secondary | ICD-10-CM | POA: Diagnosis not present

## 2015-01-30 DIAGNOSIS — Z79899 Other long term (current) drug therapy: Secondary | ICD-10-CM | POA: Diagnosis not present

## 2015-01-30 DIAGNOSIS — R270 Ataxia, unspecified: Secondary | ICD-10-CM | POA: Diagnosis not present

## 2015-01-30 DIAGNOSIS — F09 Unspecified mental disorder due to known physiological condition: Secondary | ICD-10-CM | POA: Diagnosis not present

## 2015-01-31 DIAGNOSIS — G35 Multiple sclerosis: Secondary | ICD-10-CM | POA: Diagnosis not present

## 2015-02-01 DIAGNOSIS — G35 Multiple sclerosis: Secondary | ICD-10-CM | POA: Diagnosis not present

## 2015-02-04 DIAGNOSIS — G35 Multiple sclerosis: Secondary | ICD-10-CM | POA: Diagnosis not present

## 2015-02-05 DIAGNOSIS — G35 Multiple sclerosis: Secondary | ICD-10-CM | POA: Diagnosis not present

## 2015-02-06 DIAGNOSIS — G35 Multiple sclerosis: Secondary | ICD-10-CM | POA: Diagnosis not present

## 2015-02-07 DIAGNOSIS — G35 Multiple sclerosis: Secondary | ICD-10-CM | POA: Diagnosis not present

## 2015-02-08 DIAGNOSIS — G35 Multiple sclerosis: Secondary | ICD-10-CM | POA: Diagnosis not present

## 2015-02-09 DIAGNOSIS — G35 Multiple sclerosis: Secondary | ICD-10-CM | POA: Diagnosis not present

## 2015-02-11 DIAGNOSIS — G35 Multiple sclerosis: Secondary | ICD-10-CM | POA: Diagnosis not present

## 2015-02-12 DIAGNOSIS — G35 Multiple sclerosis: Secondary | ICD-10-CM | POA: Diagnosis not present

## 2015-02-13 DIAGNOSIS — G35 Multiple sclerosis: Secondary | ICD-10-CM | POA: Diagnosis not present

## 2015-02-14 DIAGNOSIS — G35 Multiple sclerosis: Secondary | ICD-10-CM | POA: Diagnosis not present

## 2015-02-15 DIAGNOSIS — G35 Multiple sclerosis: Secondary | ICD-10-CM | POA: Diagnosis not present

## 2015-02-16 DIAGNOSIS — G35 Multiple sclerosis: Secondary | ICD-10-CM | POA: Diagnosis not present

## 2015-02-19 DIAGNOSIS — G35 Multiple sclerosis: Secondary | ICD-10-CM | POA: Diagnosis not present

## 2015-02-20 DIAGNOSIS — G35 Multiple sclerosis: Secondary | ICD-10-CM | POA: Diagnosis not present

## 2015-02-21 DIAGNOSIS — G35 Multiple sclerosis: Secondary | ICD-10-CM | POA: Diagnosis not present

## 2015-02-22 DIAGNOSIS — G35 Multiple sclerosis: Secondary | ICD-10-CM | POA: Diagnosis not present

## 2015-02-23 DIAGNOSIS — G35 Multiple sclerosis: Secondary | ICD-10-CM | POA: Diagnosis not present

## 2015-02-25 DIAGNOSIS — G35 Multiple sclerosis: Secondary | ICD-10-CM | POA: Diagnosis not present

## 2015-02-26 DIAGNOSIS — G35 Multiple sclerosis: Secondary | ICD-10-CM | POA: Diagnosis not present

## 2015-02-27 DIAGNOSIS — F419 Anxiety disorder, unspecified: Secondary | ICD-10-CM | POA: Diagnosis not present

## 2015-02-27 DIAGNOSIS — F329 Major depressive disorder, single episode, unspecified: Secondary | ICD-10-CM | POA: Diagnosis not present

## 2015-02-27 DIAGNOSIS — G35 Multiple sclerosis: Secondary | ICD-10-CM | POA: Diagnosis not present

## 2015-02-27 DIAGNOSIS — Z681 Body mass index (BMI) 19 or less, adult: Secondary | ICD-10-CM | POA: Diagnosis not present

## 2015-02-27 DIAGNOSIS — Z1389 Encounter for screening for other disorder: Secondary | ICD-10-CM | POA: Diagnosis not present

## 2015-02-27 DIAGNOSIS — B351 Tinea unguium: Secondary | ICD-10-CM | POA: Diagnosis not present

## 2015-02-28 DIAGNOSIS — G35 Multiple sclerosis: Secondary | ICD-10-CM | POA: Diagnosis not present

## 2015-03-01 DIAGNOSIS — G35 Multiple sclerosis: Secondary | ICD-10-CM | POA: Diagnosis not present

## 2015-03-02 DIAGNOSIS — G35 Multiple sclerosis: Secondary | ICD-10-CM | POA: Diagnosis not present

## 2015-03-08 DIAGNOSIS — G35 Multiple sclerosis: Secondary | ICD-10-CM | POA: Diagnosis not present

## 2015-03-09 DIAGNOSIS — G35 Multiple sclerosis: Secondary | ICD-10-CM | POA: Diagnosis not present

## 2015-03-11 DIAGNOSIS — G35 Multiple sclerosis: Secondary | ICD-10-CM | POA: Diagnosis not present

## 2015-03-12 DIAGNOSIS — G35 Multiple sclerosis: Secondary | ICD-10-CM | POA: Diagnosis not present

## 2015-03-13 DIAGNOSIS — G35 Multiple sclerosis: Secondary | ICD-10-CM | POA: Diagnosis not present

## 2015-03-14 DIAGNOSIS — G35 Multiple sclerosis: Secondary | ICD-10-CM | POA: Diagnosis not present

## 2015-03-15 DIAGNOSIS — G35 Multiple sclerosis: Secondary | ICD-10-CM | POA: Diagnosis not present

## 2015-03-16 DIAGNOSIS — G35 Multiple sclerosis: Secondary | ICD-10-CM | POA: Diagnosis not present

## 2015-03-18 DIAGNOSIS — G35 Multiple sclerosis: Secondary | ICD-10-CM | POA: Diagnosis not present

## 2015-03-19 DIAGNOSIS — G35 Multiple sclerosis: Secondary | ICD-10-CM | POA: Diagnosis not present

## 2015-03-20 DIAGNOSIS — G35 Multiple sclerosis: Secondary | ICD-10-CM | POA: Diagnosis not present

## 2015-03-21 DIAGNOSIS — G35 Multiple sclerosis: Secondary | ICD-10-CM | POA: Diagnosis not present

## 2015-03-22 DIAGNOSIS — G35 Multiple sclerosis: Secondary | ICD-10-CM | POA: Diagnosis not present

## 2015-03-23 DIAGNOSIS — G35 Multiple sclerosis: Secondary | ICD-10-CM | POA: Diagnosis not present

## 2015-03-25 DIAGNOSIS — G35 Multiple sclerosis: Secondary | ICD-10-CM | POA: Diagnosis not present

## 2015-03-26 DIAGNOSIS — G35 Multiple sclerosis: Secondary | ICD-10-CM | POA: Diagnosis not present

## 2015-03-27 DIAGNOSIS — G35 Multiple sclerosis: Secondary | ICD-10-CM | POA: Diagnosis not present

## 2015-03-28 DIAGNOSIS — G35 Multiple sclerosis: Secondary | ICD-10-CM | POA: Diagnosis not present

## 2015-03-29 DIAGNOSIS — G35 Multiple sclerosis: Secondary | ICD-10-CM | POA: Diagnosis not present

## 2015-03-30 DIAGNOSIS — G35 Multiple sclerosis: Secondary | ICD-10-CM | POA: Diagnosis not present

## 2015-04-01 DIAGNOSIS — G35 Multiple sclerosis: Secondary | ICD-10-CM | POA: Diagnosis not present

## 2015-04-02 DIAGNOSIS — G35 Multiple sclerosis: Secondary | ICD-10-CM | POA: Diagnosis not present

## 2015-04-03 DIAGNOSIS — G35 Multiple sclerosis: Secondary | ICD-10-CM | POA: Diagnosis not present

## 2015-04-04 DIAGNOSIS — G35 Multiple sclerosis: Secondary | ICD-10-CM | POA: Diagnosis not present

## 2015-04-05 DIAGNOSIS — R2689 Other abnormalities of gait and mobility: Secondary | ICD-10-CM | POA: Diagnosis not present

## 2015-04-05 DIAGNOSIS — G35 Multiple sclerosis: Secondary | ICD-10-CM | POA: Diagnosis not present

## 2015-04-08 DIAGNOSIS — G35 Multiple sclerosis: Secondary | ICD-10-CM | POA: Diagnosis not present

## 2015-04-09 DIAGNOSIS — G35 Multiple sclerosis: Secondary | ICD-10-CM | POA: Diagnosis not present

## 2015-04-10 ENCOUNTER — Encounter: Payer: Self-pay | Admitting: Podiatry

## 2015-04-10 ENCOUNTER — Ambulatory Visit (INDEPENDENT_AMBULATORY_CARE_PROVIDER_SITE_OTHER): Payer: Commercial Managed Care - HMO | Admitting: Podiatry

## 2015-04-10 VITALS — BP 125/73 | HR 78 | Temp 99.7°F | Resp 14

## 2015-04-10 DIAGNOSIS — B351 Tinea unguium: Secondary | ICD-10-CM

## 2015-04-10 DIAGNOSIS — G35 Multiple sclerosis: Secondary | ICD-10-CM | POA: Diagnosis not present

## 2015-04-10 NOTE — Progress Notes (Signed)
   Subjective:    Patient ID: Erin Berg, female    DOB: Nov 14, 1965, 49 y.o.   MRN: [id number]  HPI This patient presents today for evaluation of discolored third left toenail area that she noticed approximately 4 months ago. Currently patient is taking terbinafine 250 mg daily and has completed 60 doses at this time and has 1 more refill. She has noticed no changes in the toenail appearance. Her primary care physician water to your present to this office for further evaluation she denies any open wounds or drainage in around this nail site   Patient has MS resulting in limited mobility and the need to use a wheelchair     Review of Systems  Genitourinary: Positive for frequency.  Neurological: Positive for headaches.  Psychiatric/Behavioral: The patient is nervous/anxious.       Objective:   Physical Exam     patient appears orientated 3 with mother and treatment room today Patient is transfers from wheelchair to treatment chair  Vascular: No peripheral edema noted bilaterally DP and PT pulses 2/4 bilaterally Capillary reflex immediate bilaterally  Neurological: Sensation to 10 g monofilament wire intact 4/5 right and 5/5 left Vibratory sensation nonreactive bilaterally Ankle reflex nonreactive bilaterally Patient has continuous spastic movements lower extremity bilaterally  Dermatological: The toenails are discolored, hypertrophic, with maximum deformity in the third left toenail. There is no surrounding erythema, edema, drainage around any the nail plates  Musculoskeletal: Continuous spastic movements bilaterally Dorsi flexion plantar flexion 5/5 right and 3/5 and 4/5 left Patient is nonambulatory          Assessment & Plan :    Assessment: Mycotic toenails 6-10 Terbinafine therapy is adequate to treat mycotic toenails MS with associated neurological deficits  Plan: At this time I made patient aware that she needed to continue an additional 30 days  of terbinafine 250 mg 1 daily for a total of 90 doses. At that time no further oral medication was indicated. I made aware then from that point a completion of medication the toenails should gradually began to clear the next 3-6 months  Reappoint at patient's request

## 2015-04-10 NOTE — Patient Instructions (Signed)
Your medical doctor has prescribed 250 mg terbinafine and you have completed 60 days without a complaint from the medication. Continue on an additional 30 days of terbinafine 250 mg 1 daily for a total of 90 doses. At that time no further oral medication is required. Observe the toenail from the base a you should gradually see a clearing from the base Ivor which may take an additional 3-6 months for the final result.  Reappoint at your request

## 2015-04-11 DIAGNOSIS — G35 Multiple sclerosis: Secondary | ICD-10-CM | POA: Diagnosis not present

## 2015-04-12 DIAGNOSIS — G35 Multiple sclerosis: Secondary | ICD-10-CM | POA: Diagnosis not present

## 2015-04-13 DIAGNOSIS — G35 Multiple sclerosis: Secondary | ICD-10-CM | POA: Diagnosis not present

## 2015-04-15 DIAGNOSIS — Z0100 Encounter for examination of eyes and vision without abnormal findings: Secondary | ICD-10-CM | POA: Diagnosis not present

## 2015-04-15 DIAGNOSIS — H5210 Myopia, unspecified eye: Secondary | ICD-10-CM | POA: Diagnosis not present

## 2015-04-16 DIAGNOSIS — G35 Multiple sclerosis: Secondary | ICD-10-CM | POA: Diagnosis not present

## 2015-04-17 DIAGNOSIS — G35 Multiple sclerosis: Secondary | ICD-10-CM | POA: Diagnosis not present

## 2015-04-18 DIAGNOSIS — G35 Multiple sclerosis: Secondary | ICD-10-CM | POA: Diagnosis not present

## 2015-04-19 DIAGNOSIS — G35 Multiple sclerosis: Secondary | ICD-10-CM | POA: Diagnosis not present

## 2015-04-20 DIAGNOSIS — G35 Multiple sclerosis: Secondary | ICD-10-CM | POA: Diagnosis not present

## 2015-04-23 DIAGNOSIS — G35 Multiple sclerosis: Secondary | ICD-10-CM | POA: Diagnosis not present

## 2015-04-24 DIAGNOSIS — G35 Multiple sclerosis: Secondary | ICD-10-CM | POA: Diagnosis not present

## 2015-04-25 DIAGNOSIS — G35 Multiple sclerosis: Secondary | ICD-10-CM | POA: Diagnosis not present

## 2015-04-26 DIAGNOSIS — G35 Multiple sclerosis: Secondary | ICD-10-CM | POA: Diagnosis not present

## 2015-04-27 DIAGNOSIS — G35 Multiple sclerosis: Secondary | ICD-10-CM | POA: Diagnosis not present

## 2015-04-29 DIAGNOSIS — G35 Multiple sclerosis: Secondary | ICD-10-CM | POA: Diagnosis not present

## 2015-04-30 DIAGNOSIS — G35 Multiple sclerosis: Secondary | ICD-10-CM | POA: Diagnosis not present

## 2015-05-01 DIAGNOSIS — G35 Multiple sclerosis: Secondary | ICD-10-CM | POA: Diagnosis not present

## 2015-05-02 DIAGNOSIS — G35 Multiple sclerosis: Secondary | ICD-10-CM | POA: Diagnosis not present

## 2015-05-03 DIAGNOSIS — G35 Multiple sclerosis: Secondary | ICD-10-CM | POA: Diagnosis not present

## 2015-05-04 DIAGNOSIS — G35 Multiple sclerosis: Secondary | ICD-10-CM | POA: Diagnosis not present

## 2015-05-06 DIAGNOSIS — R2689 Other abnormalities of gait and mobility: Secondary | ICD-10-CM | POA: Diagnosis not present

## 2015-05-06 DIAGNOSIS — G35 Multiple sclerosis: Secondary | ICD-10-CM | POA: Diagnosis not present

## 2015-05-07 DIAGNOSIS — G35 Multiple sclerosis: Secondary | ICD-10-CM | POA: Diagnosis not present

## 2015-05-08 DIAGNOSIS — G35 Multiple sclerosis: Secondary | ICD-10-CM | POA: Diagnosis not present

## 2015-05-09 DIAGNOSIS — G35 Multiple sclerosis: Secondary | ICD-10-CM | POA: Diagnosis not present

## 2015-05-10 DIAGNOSIS — G35 Multiple sclerosis: Secondary | ICD-10-CM | POA: Diagnosis not present

## 2015-05-11 DIAGNOSIS — G35 Multiple sclerosis: Secondary | ICD-10-CM | POA: Diagnosis not present

## 2015-05-13 DIAGNOSIS — G35 Multiple sclerosis: Secondary | ICD-10-CM | POA: Diagnosis not present

## 2015-05-14 DIAGNOSIS — G35 Multiple sclerosis: Secondary | ICD-10-CM | POA: Diagnosis not present

## 2015-05-15 DIAGNOSIS — G35 Multiple sclerosis: Secondary | ICD-10-CM | POA: Diagnosis not present

## 2015-05-16 DIAGNOSIS — G35 Multiple sclerosis: Secondary | ICD-10-CM | POA: Diagnosis not present

## 2015-05-17 DIAGNOSIS — G35 Multiple sclerosis: Secondary | ICD-10-CM | POA: Diagnosis not present

## 2015-05-18 DIAGNOSIS — G35 Multiple sclerosis: Secondary | ICD-10-CM | POA: Diagnosis not present

## 2015-05-20 DIAGNOSIS — G35 Multiple sclerosis: Secondary | ICD-10-CM | POA: Diagnosis not present

## 2015-05-21 DIAGNOSIS — G35 Multiple sclerosis: Secondary | ICD-10-CM | POA: Diagnosis not present

## 2015-05-22 DIAGNOSIS — G35 Multiple sclerosis: Secondary | ICD-10-CM | POA: Diagnosis not present

## 2015-05-23 DIAGNOSIS — G35 Multiple sclerosis: Secondary | ICD-10-CM | POA: Diagnosis not present

## 2015-05-24 DIAGNOSIS — G35 Multiple sclerosis: Secondary | ICD-10-CM | POA: Diagnosis not present

## 2015-05-25 DIAGNOSIS — G35 Multiple sclerosis: Secondary | ICD-10-CM | POA: Diagnosis not present

## 2015-05-27 DIAGNOSIS — G35 Multiple sclerosis: Secondary | ICD-10-CM | POA: Diagnosis not present

## 2015-05-28 DIAGNOSIS — G35 Multiple sclerosis: Secondary | ICD-10-CM | POA: Diagnosis not present

## 2015-05-29 DIAGNOSIS — G35 Multiple sclerosis: Secondary | ICD-10-CM | POA: Diagnosis not present

## 2015-05-30 DIAGNOSIS — G35 Multiple sclerosis: Secondary | ICD-10-CM | POA: Diagnosis not present

## 2015-05-31 DIAGNOSIS — G35 Multiple sclerosis: Secondary | ICD-10-CM | POA: Diagnosis not present

## 2015-06-01 DIAGNOSIS — G35 Multiple sclerosis: Secondary | ICD-10-CM | POA: Diagnosis not present

## 2015-06-03 DIAGNOSIS — F09 Unspecified mental disorder due to known physiological condition: Secondary | ICD-10-CM | POA: Diagnosis not present

## 2015-06-03 DIAGNOSIS — R5383 Other fatigue: Secondary | ICD-10-CM | POA: Diagnosis not present

## 2015-06-03 DIAGNOSIS — G35 Multiple sclerosis: Secondary | ICD-10-CM | POA: Diagnosis not present

## 2015-06-03 DIAGNOSIS — R270 Ataxia, unspecified: Secondary | ICD-10-CM | POA: Diagnosis not present

## 2015-06-03 DIAGNOSIS — Z79899 Other long term (current) drug therapy: Secondary | ICD-10-CM | POA: Diagnosis not present

## 2015-06-03 DIAGNOSIS — M818 Other osteoporosis without current pathological fracture: Secondary | ICD-10-CM | POA: Diagnosis not present

## 2015-06-03 DIAGNOSIS — E559 Vitamin D deficiency, unspecified: Secondary | ICD-10-CM | POA: Diagnosis not present

## 2015-06-03 DIAGNOSIS — E538 Deficiency of other specified B group vitamins: Secondary | ICD-10-CM | POA: Diagnosis not present

## 2015-06-04 DIAGNOSIS — G35 Multiple sclerosis: Secondary | ICD-10-CM | POA: Diagnosis not present

## 2015-06-05 DIAGNOSIS — G35 Multiple sclerosis: Secondary | ICD-10-CM | POA: Diagnosis not present

## 2015-06-05 DIAGNOSIS — R2689 Other abnormalities of gait and mobility: Secondary | ICD-10-CM | POA: Diagnosis not present

## 2015-06-06 DIAGNOSIS — G35 Multiple sclerosis: Secondary | ICD-10-CM | POA: Diagnosis not present

## 2015-06-07 DIAGNOSIS — G35 Multiple sclerosis: Secondary | ICD-10-CM | POA: Diagnosis not present

## 2015-06-08 DIAGNOSIS — G35 Multiple sclerosis: Secondary | ICD-10-CM | POA: Diagnosis not present

## 2015-06-10 DIAGNOSIS — G35 Multiple sclerosis: Secondary | ICD-10-CM | POA: Diagnosis not present

## 2015-06-13 DIAGNOSIS — G35 Multiple sclerosis: Secondary | ICD-10-CM | POA: Diagnosis not present

## 2015-06-14 DIAGNOSIS — G35 Multiple sclerosis: Secondary | ICD-10-CM | POA: Diagnosis not present

## 2015-06-17 DIAGNOSIS — G35 Multiple sclerosis: Secondary | ICD-10-CM | POA: Diagnosis not present

## 2015-06-19 DIAGNOSIS — G35 Multiple sclerosis: Secondary | ICD-10-CM | POA: Diagnosis not present

## 2015-06-20 DIAGNOSIS — G35 Multiple sclerosis: Secondary | ICD-10-CM | POA: Diagnosis not present

## 2015-06-21 DIAGNOSIS — G35 Multiple sclerosis: Secondary | ICD-10-CM | POA: Diagnosis not present

## 2015-06-24 DIAGNOSIS — G35 Multiple sclerosis: Secondary | ICD-10-CM | POA: Diagnosis not present

## 2015-06-25 DIAGNOSIS — G35 Multiple sclerosis: Secondary | ICD-10-CM | POA: Diagnosis not present

## 2015-06-26 DIAGNOSIS — G35 Multiple sclerosis: Secondary | ICD-10-CM | POA: Diagnosis not present

## 2015-06-27 DIAGNOSIS — G35 Multiple sclerosis: Secondary | ICD-10-CM | POA: Diagnosis not present

## 2015-06-28 DIAGNOSIS — G35 Multiple sclerosis: Secondary | ICD-10-CM | POA: Diagnosis not present

## 2015-07-01 DIAGNOSIS — G35 Multiple sclerosis: Secondary | ICD-10-CM | POA: Diagnosis not present

## 2015-07-02 DIAGNOSIS — G35 Multiple sclerosis: Secondary | ICD-10-CM | POA: Diagnosis not present

## 2015-07-03 DIAGNOSIS — G35 Multiple sclerosis: Secondary | ICD-10-CM | POA: Diagnosis not present

## 2015-07-04 DIAGNOSIS — G35 Multiple sclerosis: Secondary | ICD-10-CM | POA: Diagnosis not present

## 2015-07-05 DIAGNOSIS — G35 Multiple sclerosis: Secondary | ICD-10-CM | POA: Diagnosis not present

## 2015-07-06 DIAGNOSIS — R2689 Other abnormalities of gait and mobility: Secondary | ICD-10-CM | POA: Diagnosis not present

## 2015-07-06 DIAGNOSIS — G35 Multiple sclerosis: Secondary | ICD-10-CM | POA: Diagnosis not present

## 2015-07-08 DIAGNOSIS — G35 Multiple sclerosis: Secondary | ICD-10-CM | POA: Diagnosis not present

## 2015-07-09 DIAGNOSIS — G35 Multiple sclerosis: Secondary | ICD-10-CM | POA: Diagnosis not present

## 2015-07-10 DIAGNOSIS — G35 Multiple sclerosis: Secondary | ICD-10-CM | POA: Diagnosis not present

## 2015-07-12 DIAGNOSIS — W1840XA Slipping, tripping and stumbling without falling, unspecified, initial encounter: Secondary | ICD-10-CM | POA: Diagnosis not present

## 2015-07-12 DIAGNOSIS — M549 Dorsalgia, unspecified: Secondary | ICD-10-CM | POA: Diagnosis not present

## 2015-07-12 DIAGNOSIS — Z87891 Personal history of nicotine dependence: Secondary | ICD-10-CM | POA: Diagnosis not present

## 2015-07-12 DIAGNOSIS — S199XXA Unspecified injury of neck, initial encounter: Secondary | ICD-10-CM | POA: Diagnosis not present

## 2015-07-12 DIAGNOSIS — M545 Low back pain: Secondary | ICD-10-CM | POA: Diagnosis not present

## 2015-07-12 DIAGNOSIS — R531 Weakness: Secondary | ICD-10-CM | POA: Diagnosis not present

## 2015-07-12 DIAGNOSIS — M62838 Other muscle spasm: Secondary | ICD-10-CM | POA: Diagnosis not present

## 2015-07-12 DIAGNOSIS — R200 Anesthesia of skin: Secondary | ICD-10-CM | POA: Diagnosis not present

## 2015-07-12 DIAGNOSIS — S0990XA Unspecified injury of head, initial encounter: Secondary | ICD-10-CM | POA: Diagnosis not present

## 2015-07-12 DIAGNOSIS — Z79899 Other long term (current) drug therapy: Secondary | ICD-10-CM | POA: Diagnosis not present

## 2015-07-12 DIAGNOSIS — M62830 Muscle spasm of back: Secondary | ICD-10-CM | POA: Diagnosis not present

## 2015-07-12 DIAGNOSIS — G35 Multiple sclerosis: Secondary | ICD-10-CM | POA: Diagnosis not present

## 2015-07-12 DIAGNOSIS — R202 Paresthesia of skin: Secondary | ICD-10-CM | POA: Diagnosis not present

## 2015-07-13 DIAGNOSIS — M62838 Other muscle spasm: Secondary | ICD-10-CM | POA: Diagnosis not present

## 2015-07-13 DIAGNOSIS — M549 Dorsalgia, unspecified: Secondary | ICD-10-CM | POA: Diagnosis not present

## 2015-07-13 DIAGNOSIS — M545 Low back pain: Secondary | ICD-10-CM | POA: Diagnosis not present

## 2015-07-13 DIAGNOSIS — R202 Paresthesia of skin: Secondary | ICD-10-CM | POA: Diagnosis not present

## 2015-07-13 DIAGNOSIS — Z87891 Personal history of nicotine dependence: Secondary | ICD-10-CM | POA: Diagnosis not present

## 2015-07-13 DIAGNOSIS — R531 Weakness: Secondary | ICD-10-CM | POA: Diagnosis not present

## 2015-07-13 DIAGNOSIS — R200 Anesthesia of skin: Secondary | ICD-10-CM | POA: Diagnosis not present

## 2015-07-13 DIAGNOSIS — G35 Multiple sclerosis: Secondary | ICD-10-CM | POA: Diagnosis not present

## 2015-07-13 DIAGNOSIS — Z79899 Other long term (current) drug therapy: Secondary | ICD-10-CM | POA: Diagnosis not present

## 2015-07-17 DIAGNOSIS — M545 Low back pain: Secondary | ICD-10-CM | POA: Diagnosis not present

## 2015-07-17 DIAGNOSIS — Z6824 Body mass index (BMI) 24.0-24.9, adult: Secondary | ICD-10-CM | POA: Diagnosis not present

## 2015-07-17 DIAGNOSIS — R252 Cramp and spasm: Secondary | ICD-10-CM | POA: Diagnosis not present

## 2015-07-17 DIAGNOSIS — F330 Major depressive disorder, recurrent, mild: Secondary | ICD-10-CM | POA: Diagnosis not present

## 2015-07-17 DIAGNOSIS — Z1389 Encounter for screening for other disorder: Secondary | ICD-10-CM | POA: Diagnosis not present

## 2015-07-17 DIAGNOSIS — G35 Multiple sclerosis: Secondary | ICD-10-CM | POA: Diagnosis not present

## 2015-07-18 DIAGNOSIS — G35 Multiple sclerosis: Secondary | ICD-10-CM | POA: Diagnosis not present

## 2015-07-19 DIAGNOSIS — G35 Multiple sclerosis: Secondary | ICD-10-CM | POA: Diagnosis not present

## 2015-07-22 DIAGNOSIS — G35 Multiple sclerosis: Secondary | ICD-10-CM | POA: Diagnosis not present

## 2015-07-23 DIAGNOSIS — G35 Multiple sclerosis: Secondary | ICD-10-CM | POA: Diagnosis not present

## 2015-07-24 DIAGNOSIS — G35 Multiple sclerosis: Secondary | ICD-10-CM | POA: Diagnosis not present

## 2015-07-25 DIAGNOSIS — G35 Multiple sclerosis: Secondary | ICD-10-CM | POA: Diagnosis not present

## 2015-07-26 DIAGNOSIS — G35 Multiple sclerosis: Secondary | ICD-10-CM | POA: Diagnosis not present

## 2015-07-29 DIAGNOSIS — G35 Multiple sclerosis: Secondary | ICD-10-CM | POA: Diagnosis not present

## 2015-07-30 DIAGNOSIS — G35 Multiple sclerosis: Secondary | ICD-10-CM | POA: Diagnosis not present

## 2015-07-31 DIAGNOSIS — G35 Multiple sclerosis: Secondary | ICD-10-CM | POA: Diagnosis not present

## 2015-08-01 DIAGNOSIS — G35 Multiple sclerosis: Secondary | ICD-10-CM | POA: Diagnosis not present

## 2015-08-02 DIAGNOSIS — G35 Multiple sclerosis: Secondary | ICD-10-CM | POA: Diagnosis not present

## 2015-08-05 DIAGNOSIS — G35 Multiple sclerosis: Secondary | ICD-10-CM | POA: Diagnosis not present

## 2015-08-05 DIAGNOSIS — R2689 Other abnormalities of gait and mobility: Secondary | ICD-10-CM | POA: Diagnosis not present

## 2015-08-07 DIAGNOSIS — G35 Multiple sclerosis: Secondary | ICD-10-CM | POA: Diagnosis not present

## 2015-08-08 DIAGNOSIS — G35 Multiple sclerosis: Secondary | ICD-10-CM | POA: Diagnosis not present

## 2015-08-09 DIAGNOSIS — G35 Multiple sclerosis: Secondary | ICD-10-CM | POA: Diagnosis not present

## 2015-08-12 DIAGNOSIS — G35 Multiple sclerosis: Secondary | ICD-10-CM | POA: Diagnosis not present

## 2015-08-13 DIAGNOSIS — G35 Multiple sclerosis: Secondary | ICD-10-CM | POA: Diagnosis not present

## 2015-08-14 DIAGNOSIS — G35 Multiple sclerosis: Secondary | ICD-10-CM | POA: Diagnosis not present

## 2015-08-15 DIAGNOSIS — G35 Multiple sclerosis: Secondary | ICD-10-CM | POA: Diagnosis not present

## 2015-08-16 DIAGNOSIS — G35 Multiple sclerosis: Secondary | ICD-10-CM | POA: Diagnosis not present

## 2015-08-19 DIAGNOSIS — G35 Multiple sclerosis: Secondary | ICD-10-CM | POA: Diagnosis not present

## 2015-08-20 DIAGNOSIS — G35 Multiple sclerosis: Secondary | ICD-10-CM | POA: Diagnosis not present

## 2015-08-21 DIAGNOSIS — G35 Multiple sclerosis: Secondary | ICD-10-CM | POA: Diagnosis not present

## 2015-08-22 DIAGNOSIS — G35 Multiple sclerosis: Secondary | ICD-10-CM | POA: Diagnosis not present

## 2015-08-23 DIAGNOSIS — G35 Multiple sclerosis: Secondary | ICD-10-CM | POA: Diagnosis not present

## 2015-08-26 DIAGNOSIS — G35 Multiple sclerosis: Secondary | ICD-10-CM | POA: Diagnosis not present

## 2015-08-27 DIAGNOSIS — G35 Multiple sclerosis: Secondary | ICD-10-CM | POA: Diagnosis not present

## 2015-08-28 DIAGNOSIS — G35 Multiple sclerosis: Secondary | ICD-10-CM | POA: Diagnosis not present

## 2015-08-29 DIAGNOSIS — G35 Multiple sclerosis: Secondary | ICD-10-CM | POA: Diagnosis not present

## 2015-08-30 DIAGNOSIS — G35 Multiple sclerosis: Secondary | ICD-10-CM | POA: Diagnosis not present

## 2015-09-02 DIAGNOSIS — G35 Multiple sclerosis: Secondary | ICD-10-CM | POA: Diagnosis not present

## 2015-09-03 DIAGNOSIS — G35 Multiple sclerosis: Secondary | ICD-10-CM | POA: Diagnosis not present

## 2015-09-04 DIAGNOSIS — G35 Multiple sclerosis: Secondary | ICD-10-CM | POA: Diagnosis not present

## 2015-09-05 DIAGNOSIS — G35 Multiple sclerosis: Secondary | ICD-10-CM | POA: Diagnosis not present

## 2015-09-05 DIAGNOSIS — R2689 Other abnormalities of gait and mobility: Secondary | ICD-10-CM | POA: Diagnosis not present

## 2015-09-06 DIAGNOSIS — G35 Multiple sclerosis: Secondary | ICD-10-CM | POA: Diagnosis not present

## 2015-09-09 DIAGNOSIS — G35 Multiple sclerosis: Secondary | ICD-10-CM | POA: Diagnosis not present

## 2015-09-10 DIAGNOSIS — G35 Multiple sclerosis: Secondary | ICD-10-CM | POA: Diagnosis not present

## 2015-09-11 DIAGNOSIS — G35 Multiple sclerosis: Secondary | ICD-10-CM | POA: Diagnosis not present

## 2015-09-12 DIAGNOSIS — G35 Multiple sclerosis: Secondary | ICD-10-CM | POA: Diagnosis not present

## 2015-09-13 DIAGNOSIS — G35 Multiple sclerosis: Secondary | ICD-10-CM | POA: Diagnosis not present

## 2015-09-16 DIAGNOSIS — G35 Multiple sclerosis: Secondary | ICD-10-CM | POA: Diagnosis not present

## 2015-09-17 DIAGNOSIS — G35 Multiple sclerosis: Secondary | ICD-10-CM | POA: Diagnosis not present

## 2015-09-18 DIAGNOSIS — G35 Multiple sclerosis: Secondary | ICD-10-CM | POA: Diagnosis not present

## 2015-09-19 DIAGNOSIS — G35 Multiple sclerosis: Secondary | ICD-10-CM | POA: Diagnosis not present

## 2015-09-20 DIAGNOSIS — G35 Multiple sclerosis: Secondary | ICD-10-CM | POA: Diagnosis not present

## 2015-09-23 DIAGNOSIS — G35 Multiple sclerosis: Secondary | ICD-10-CM | POA: Diagnosis not present

## 2015-09-24 DIAGNOSIS — G35 Multiple sclerosis: Secondary | ICD-10-CM | POA: Diagnosis not present

## 2015-09-25 DIAGNOSIS — G35 Multiple sclerosis: Secondary | ICD-10-CM | POA: Diagnosis not present

## 2015-09-26 DIAGNOSIS — G35 Multiple sclerosis: Secondary | ICD-10-CM | POA: Diagnosis not present

## 2015-09-27 DIAGNOSIS — G35 Multiple sclerosis: Secondary | ICD-10-CM | POA: Diagnosis not present

## 2015-09-30 DIAGNOSIS — G35 Multiple sclerosis: Secondary | ICD-10-CM | POA: Diagnosis not present

## 2015-10-01 DIAGNOSIS — G35 Multiple sclerosis: Secondary | ICD-10-CM | POA: Diagnosis not present

## 2015-10-02 DIAGNOSIS — G35 Multiple sclerosis: Secondary | ICD-10-CM | POA: Diagnosis not present

## 2015-10-03 DIAGNOSIS — G35 Multiple sclerosis: Secondary | ICD-10-CM | POA: Diagnosis not present

## 2015-10-04 DIAGNOSIS — G35 Multiple sclerosis: Secondary | ICD-10-CM | POA: Diagnosis not present

## 2015-10-06 DIAGNOSIS — R2689 Other abnormalities of gait and mobility: Secondary | ICD-10-CM | POA: Diagnosis not present

## 2015-10-06 DIAGNOSIS — G35 Multiple sclerosis: Secondary | ICD-10-CM | POA: Diagnosis not present

## 2015-10-07 DIAGNOSIS — G35 Multiple sclerosis: Secondary | ICD-10-CM | POA: Diagnosis not present

## 2015-10-08 DIAGNOSIS — G35 Multiple sclerosis: Secondary | ICD-10-CM | POA: Diagnosis not present

## 2015-10-09 DIAGNOSIS — G35 Multiple sclerosis: Secondary | ICD-10-CM | POA: Diagnosis not present

## 2015-10-10 DIAGNOSIS — Z681 Body mass index (BMI) 19 or less, adult: Secondary | ICD-10-CM | POA: Diagnosis not present

## 2015-10-10 DIAGNOSIS — E782 Mixed hyperlipidemia: Secondary | ICD-10-CM | POA: Diagnosis not present

## 2015-10-10 DIAGNOSIS — J0100 Acute maxillary sinusitis, unspecified: Secondary | ICD-10-CM | POA: Diagnosis not present

## 2015-10-10 DIAGNOSIS — R05 Cough: Secondary | ICD-10-CM | POA: Diagnosis not present

## 2015-10-10 DIAGNOSIS — Z1389 Encounter for screening for other disorder: Secondary | ICD-10-CM | POA: Diagnosis not present

## 2015-10-10 DIAGNOSIS — Z0000 Encounter for general adult medical examination without abnormal findings: Secondary | ICD-10-CM | POA: Diagnosis not present

## 2015-10-11 DIAGNOSIS — R945 Abnormal results of liver function studies: Secondary | ICD-10-CM | POA: Diagnosis not present

## 2015-10-11 DIAGNOSIS — G35 Multiple sclerosis: Secondary | ICD-10-CM | POA: Diagnosis not present

## 2015-10-21 DIAGNOSIS — G35 Multiple sclerosis: Secondary | ICD-10-CM | POA: Diagnosis not present

## 2015-10-22 DIAGNOSIS — G35 Multiple sclerosis: Secondary | ICD-10-CM | POA: Diagnosis not present

## 2015-10-23 DIAGNOSIS — G35 Multiple sclerosis: Secondary | ICD-10-CM | POA: Diagnosis not present

## 2015-10-24 DIAGNOSIS — G35 Multiple sclerosis: Secondary | ICD-10-CM | POA: Diagnosis not present

## 2015-10-25 DIAGNOSIS — G35 Multiple sclerosis: Secondary | ICD-10-CM | POA: Diagnosis not present

## 2015-10-28 DIAGNOSIS — D72829 Elevated white blood cell count, unspecified: Secondary | ICD-10-CM | POA: Diagnosis not present

## 2015-10-28 DIAGNOSIS — G35 Multiple sclerosis: Secondary | ICD-10-CM | POA: Diagnosis not present

## 2015-10-28 DIAGNOSIS — K7689 Other specified diseases of liver: Secondary | ICD-10-CM | POA: Diagnosis not present

## 2015-10-29 DIAGNOSIS — G35 Multiple sclerosis: Secondary | ICD-10-CM | POA: Diagnosis not present

## 2015-10-30 DIAGNOSIS — G35 Multiple sclerosis: Secondary | ICD-10-CM | POA: Diagnosis not present

## 2015-10-31 DIAGNOSIS — G35 Multiple sclerosis: Secondary | ICD-10-CM | POA: Diagnosis not present

## 2015-11-01 DIAGNOSIS — G35 Multiple sclerosis: Secondary | ICD-10-CM | POA: Diagnosis not present

## 2015-11-03 DIAGNOSIS — G35 Multiple sclerosis: Secondary | ICD-10-CM | POA: Diagnosis not present

## 2015-11-03 DIAGNOSIS — R2689 Other abnormalities of gait and mobility: Secondary | ICD-10-CM | POA: Diagnosis not present

## 2015-11-04 DIAGNOSIS — G35 Multiple sclerosis: Secondary | ICD-10-CM | POA: Diagnosis not present

## 2015-11-05 DIAGNOSIS — G35 Multiple sclerosis: Secondary | ICD-10-CM | POA: Diagnosis not present

## 2015-11-06 DIAGNOSIS — G35 Multiple sclerosis: Secondary | ICD-10-CM | POA: Diagnosis not present

## 2015-11-07 DIAGNOSIS — G35 Multiple sclerosis: Secondary | ICD-10-CM | POA: Diagnosis not present

## 2015-11-08 DIAGNOSIS — G35 Multiple sclerosis: Secondary | ICD-10-CM | POA: Diagnosis not present

## 2015-11-11 DIAGNOSIS — G35 Multiple sclerosis: Secondary | ICD-10-CM | POA: Diagnosis not present

## 2015-11-12 DIAGNOSIS — G35 Multiple sclerosis: Secondary | ICD-10-CM | POA: Diagnosis not present

## 2015-11-13 DIAGNOSIS — G35 Multiple sclerosis: Secondary | ICD-10-CM | POA: Diagnosis not present

## 2015-11-14 DIAGNOSIS — G35 Multiple sclerosis: Secondary | ICD-10-CM | POA: Diagnosis not present

## 2015-11-15 DIAGNOSIS — G35 Multiple sclerosis: Secondary | ICD-10-CM | POA: Diagnosis not present

## 2015-11-18 DIAGNOSIS — G35 Multiple sclerosis: Secondary | ICD-10-CM | POA: Diagnosis not present

## 2015-11-19 DIAGNOSIS — G35 Multiple sclerosis: Secondary | ICD-10-CM | POA: Diagnosis not present

## 2015-11-20 DIAGNOSIS — G35 Multiple sclerosis: Secondary | ICD-10-CM | POA: Diagnosis not present

## 2015-11-21 DIAGNOSIS — G35 Multiple sclerosis: Secondary | ICD-10-CM | POA: Diagnosis not present

## 2015-11-22 DIAGNOSIS — G35 Multiple sclerosis: Secondary | ICD-10-CM | POA: Diagnosis not present

## 2015-11-25 DIAGNOSIS — G35 Multiple sclerosis: Secondary | ICD-10-CM | POA: Diagnosis not present

## 2015-11-26 DIAGNOSIS — G35 Multiple sclerosis: Secondary | ICD-10-CM | POA: Diagnosis not present

## 2015-11-27 DIAGNOSIS — G35 Multiple sclerosis: Secondary | ICD-10-CM | POA: Diagnosis not present

## 2015-11-28 DIAGNOSIS — G35 Multiple sclerosis: Secondary | ICD-10-CM | POA: Diagnosis not present

## 2015-11-29 DIAGNOSIS — G35 Multiple sclerosis: Secondary | ICD-10-CM | POA: Diagnosis not present

## 2015-12-02 DIAGNOSIS — G35 Multiple sclerosis: Secondary | ICD-10-CM | POA: Diagnosis not present

## 2015-12-03 DIAGNOSIS — G4459 Other complicated headache syndrome: Secondary | ICD-10-CM | POA: Diagnosis not present

## 2015-12-03 DIAGNOSIS — Z79899 Other long term (current) drug therapy: Secondary | ICD-10-CM | POA: Diagnosis not present

## 2015-12-03 DIAGNOSIS — F419 Anxiety disorder, unspecified: Secondary | ICD-10-CM | POA: Diagnosis not present

## 2015-12-03 DIAGNOSIS — R270 Ataxia, unspecified: Secondary | ICD-10-CM | POA: Diagnosis not present

## 2015-12-03 DIAGNOSIS — E6609 Other obesity due to excess calories: Secondary | ICD-10-CM | POA: Diagnosis not present

## 2015-12-03 DIAGNOSIS — F329 Major depressive disorder, single episode, unspecified: Secondary | ICD-10-CM | POA: Diagnosis not present

## 2015-12-03 DIAGNOSIS — G35 Multiple sclerosis: Secondary | ICD-10-CM | POA: Diagnosis not present

## 2015-12-04 DIAGNOSIS — G35 Multiple sclerosis: Secondary | ICD-10-CM | POA: Diagnosis not present

## 2015-12-04 DIAGNOSIS — R2689 Other abnormalities of gait and mobility: Secondary | ICD-10-CM | POA: Diagnosis not present

## 2015-12-05 DIAGNOSIS — G35 Multiple sclerosis: Secondary | ICD-10-CM | POA: Diagnosis not present

## 2015-12-06 DIAGNOSIS — G35 Multiple sclerosis: Secondary | ICD-10-CM | POA: Diagnosis not present

## 2015-12-09 DIAGNOSIS — G35 Multiple sclerosis: Secondary | ICD-10-CM | POA: Diagnosis not present

## 2015-12-10 DIAGNOSIS — G35 Multiple sclerosis: Secondary | ICD-10-CM | POA: Diagnosis not present

## 2015-12-11 DIAGNOSIS — Z9181 History of falling: Secondary | ICD-10-CM | POA: Diagnosis not present

## 2015-12-11 DIAGNOSIS — F419 Anxiety disorder, unspecified: Secondary | ICD-10-CM | POA: Diagnosis not present

## 2015-12-11 DIAGNOSIS — R2689 Other abnormalities of gait and mobility: Secondary | ICD-10-CM | POA: Diagnosis not present

## 2015-12-11 DIAGNOSIS — F329 Major depressive disorder, single episode, unspecified: Secondary | ICD-10-CM | POA: Diagnosis not present

## 2015-12-11 DIAGNOSIS — M21372 Foot drop, left foot: Secondary | ICD-10-CM | POA: Diagnosis not present

## 2015-12-11 DIAGNOSIS — G35 Multiple sclerosis: Secondary | ICD-10-CM | POA: Diagnosis not present

## 2015-12-12 DIAGNOSIS — G35 Multiple sclerosis: Secondary | ICD-10-CM | POA: Diagnosis not present

## 2015-12-13 DIAGNOSIS — G35 Multiple sclerosis: Secondary | ICD-10-CM | POA: Diagnosis not present

## 2015-12-13 DIAGNOSIS — M21372 Foot drop, left foot: Secondary | ICD-10-CM | POA: Diagnosis not present

## 2015-12-13 DIAGNOSIS — R2689 Other abnormalities of gait and mobility: Secondary | ICD-10-CM | POA: Diagnosis not present

## 2015-12-13 DIAGNOSIS — F419 Anxiety disorder, unspecified: Secondary | ICD-10-CM | POA: Diagnosis not present

## 2015-12-13 DIAGNOSIS — Z9181 History of falling: Secondary | ICD-10-CM | POA: Diagnosis not present

## 2015-12-13 DIAGNOSIS — F329 Major depressive disorder, single episode, unspecified: Secondary | ICD-10-CM | POA: Diagnosis not present

## 2015-12-16 DIAGNOSIS — G35 Multiple sclerosis: Secondary | ICD-10-CM | POA: Diagnosis not present

## 2015-12-17 DIAGNOSIS — G35 Multiple sclerosis: Secondary | ICD-10-CM | POA: Diagnosis not present

## 2015-12-18 DIAGNOSIS — G35 Multiple sclerosis: Secondary | ICD-10-CM | POA: Diagnosis not present

## 2015-12-18 DIAGNOSIS — Z9181 History of falling: Secondary | ICD-10-CM | POA: Diagnosis not present

## 2015-12-18 DIAGNOSIS — R2689 Other abnormalities of gait and mobility: Secondary | ICD-10-CM | POA: Diagnosis not present

## 2015-12-18 DIAGNOSIS — F419 Anxiety disorder, unspecified: Secondary | ICD-10-CM | POA: Diagnosis not present

## 2015-12-18 DIAGNOSIS — M21372 Foot drop, left foot: Secondary | ICD-10-CM | POA: Diagnosis not present

## 2015-12-18 DIAGNOSIS — F329 Major depressive disorder, single episode, unspecified: Secondary | ICD-10-CM | POA: Diagnosis not present

## 2015-12-19 DIAGNOSIS — F419 Anxiety disorder, unspecified: Secondary | ICD-10-CM | POA: Diagnosis not present

## 2015-12-19 DIAGNOSIS — R2689 Other abnormalities of gait and mobility: Secondary | ICD-10-CM | POA: Diagnosis not present

## 2015-12-19 DIAGNOSIS — G35 Multiple sclerosis: Secondary | ICD-10-CM | POA: Diagnosis not present

## 2015-12-19 DIAGNOSIS — M21372 Foot drop, left foot: Secondary | ICD-10-CM | POA: Diagnosis not present

## 2015-12-19 DIAGNOSIS — F329 Major depressive disorder, single episode, unspecified: Secondary | ICD-10-CM | POA: Diagnosis not present

## 2015-12-19 DIAGNOSIS — Z9181 History of falling: Secondary | ICD-10-CM | POA: Diagnosis not present

## 2015-12-20 DIAGNOSIS — G35 Multiple sclerosis: Secondary | ICD-10-CM | POA: Diagnosis not present

## 2015-12-23 DIAGNOSIS — F329 Major depressive disorder, single episode, unspecified: Secondary | ICD-10-CM | POA: Diagnosis not present

## 2015-12-23 DIAGNOSIS — R2689 Other abnormalities of gait and mobility: Secondary | ICD-10-CM | POA: Diagnosis not present

## 2015-12-23 DIAGNOSIS — Z9181 History of falling: Secondary | ICD-10-CM | POA: Diagnosis not present

## 2015-12-23 DIAGNOSIS — F419 Anxiety disorder, unspecified: Secondary | ICD-10-CM | POA: Diagnosis not present

## 2015-12-23 DIAGNOSIS — M21372 Foot drop, left foot: Secondary | ICD-10-CM | POA: Diagnosis not present

## 2015-12-23 DIAGNOSIS — G35 Multiple sclerosis: Secondary | ICD-10-CM | POA: Diagnosis not present

## 2015-12-24 DIAGNOSIS — G35 Multiple sclerosis: Secondary | ICD-10-CM | POA: Diagnosis not present

## 2015-12-25 DIAGNOSIS — F419 Anxiety disorder, unspecified: Secondary | ICD-10-CM | POA: Diagnosis not present

## 2015-12-25 DIAGNOSIS — R2689 Other abnormalities of gait and mobility: Secondary | ICD-10-CM | POA: Diagnosis not present

## 2015-12-25 DIAGNOSIS — M21372 Foot drop, left foot: Secondary | ICD-10-CM | POA: Diagnosis not present

## 2015-12-25 DIAGNOSIS — G35 Multiple sclerosis: Secondary | ICD-10-CM | POA: Diagnosis not present

## 2015-12-25 DIAGNOSIS — F329 Major depressive disorder, single episode, unspecified: Secondary | ICD-10-CM | POA: Diagnosis not present

## 2015-12-25 DIAGNOSIS — Z9181 History of falling: Secondary | ICD-10-CM | POA: Diagnosis not present

## 2015-12-26 DIAGNOSIS — G35 Multiple sclerosis: Secondary | ICD-10-CM | POA: Diagnosis not present

## 2015-12-27 DIAGNOSIS — G35 Multiple sclerosis: Secondary | ICD-10-CM | POA: Diagnosis not present

## 2015-12-30 DIAGNOSIS — G35 Multiple sclerosis: Secondary | ICD-10-CM | POA: Diagnosis not present

## 2015-12-31 DIAGNOSIS — M21372 Foot drop, left foot: Secondary | ICD-10-CM | POA: Diagnosis not present

## 2015-12-31 DIAGNOSIS — R2689 Other abnormalities of gait and mobility: Secondary | ICD-10-CM | POA: Diagnosis not present

## 2015-12-31 DIAGNOSIS — F329 Major depressive disorder, single episode, unspecified: Secondary | ICD-10-CM | POA: Diagnosis not present

## 2015-12-31 DIAGNOSIS — F419 Anxiety disorder, unspecified: Secondary | ICD-10-CM | POA: Diagnosis not present

## 2015-12-31 DIAGNOSIS — Z9181 History of falling: Secondary | ICD-10-CM | POA: Diagnosis not present

## 2015-12-31 DIAGNOSIS — G35 Multiple sclerosis: Secondary | ICD-10-CM | POA: Diagnosis not present

## 2016-01-01 DIAGNOSIS — G35 Multiple sclerosis: Secondary | ICD-10-CM | POA: Diagnosis not present

## 2016-01-02 DIAGNOSIS — F329 Major depressive disorder, single episode, unspecified: Secondary | ICD-10-CM | POA: Diagnosis not present

## 2016-01-02 DIAGNOSIS — M21372 Foot drop, left foot: Secondary | ICD-10-CM | POA: Diagnosis not present

## 2016-01-02 DIAGNOSIS — R2689 Other abnormalities of gait and mobility: Secondary | ICD-10-CM | POA: Diagnosis not present

## 2016-01-02 DIAGNOSIS — G35 Multiple sclerosis: Secondary | ICD-10-CM | POA: Diagnosis not present

## 2016-01-02 DIAGNOSIS — Z9181 History of falling: Secondary | ICD-10-CM | POA: Diagnosis not present

## 2016-01-02 DIAGNOSIS — F419 Anxiety disorder, unspecified: Secondary | ICD-10-CM | POA: Diagnosis not present

## 2016-01-03 DIAGNOSIS — R2689 Other abnormalities of gait and mobility: Secondary | ICD-10-CM | POA: Diagnosis not present

## 2016-01-03 DIAGNOSIS — G35 Multiple sclerosis: Secondary | ICD-10-CM | POA: Diagnosis not present

## 2016-01-06 DIAGNOSIS — G35 Multiple sclerosis: Secondary | ICD-10-CM | POA: Diagnosis not present

## 2016-01-07 DIAGNOSIS — Z9181 History of falling: Secondary | ICD-10-CM | POA: Diagnosis not present

## 2016-01-07 DIAGNOSIS — G35 Multiple sclerosis: Secondary | ICD-10-CM | POA: Diagnosis not present

## 2016-01-07 DIAGNOSIS — M21372 Foot drop, left foot: Secondary | ICD-10-CM | POA: Diagnosis not present

## 2016-01-07 DIAGNOSIS — F419 Anxiety disorder, unspecified: Secondary | ICD-10-CM | POA: Diagnosis not present

## 2016-01-07 DIAGNOSIS — F329 Major depressive disorder, single episode, unspecified: Secondary | ICD-10-CM | POA: Diagnosis not present

## 2016-01-07 DIAGNOSIS — R2689 Other abnormalities of gait and mobility: Secondary | ICD-10-CM | POA: Diagnosis not present

## 2016-01-08 DIAGNOSIS — G35 Multiple sclerosis: Secondary | ICD-10-CM | POA: Diagnosis not present

## 2016-01-09 DIAGNOSIS — R2689 Other abnormalities of gait and mobility: Secondary | ICD-10-CM | POA: Diagnosis not present

## 2016-01-09 DIAGNOSIS — F419 Anxiety disorder, unspecified: Secondary | ICD-10-CM | POA: Diagnosis not present

## 2016-01-09 DIAGNOSIS — Z9181 History of falling: Secondary | ICD-10-CM | POA: Diagnosis not present

## 2016-01-09 DIAGNOSIS — M21372 Foot drop, left foot: Secondary | ICD-10-CM | POA: Diagnosis not present

## 2016-01-09 DIAGNOSIS — G35 Multiple sclerosis: Secondary | ICD-10-CM | POA: Diagnosis not present

## 2016-01-09 DIAGNOSIS — F329 Major depressive disorder, single episode, unspecified: Secondary | ICD-10-CM | POA: Diagnosis not present

## 2016-01-10 DIAGNOSIS — G35 Multiple sclerosis: Secondary | ICD-10-CM | POA: Diagnosis not present

## 2016-01-13 DIAGNOSIS — G35 Multiple sclerosis: Secondary | ICD-10-CM | POA: Diagnosis not present

## 2016-01-14 DIAGNOSIS — G35 Multiple sclerosis: Secondary | ICD-10-CM | POA: Diagnosis not present

## 2016-01-15 DIAGNOSIS — G35 Multiple sclerosis: Secondary | ICD-10-CM | POA: Diagnosis not present

## 2016-01-15 DIAGNOSIS — F329 Major depressive disorder, single episode, unspecified: Secondary | ICD-10-CM | POA: Diagnosis not present

## 2016-01-15 DIAGNOSIS — Z9181 History of falling: Secondary | ICD-10-CM | POA: Diagnosis not present

## 2016-01-15 DIAGNOSIS — F419 Anxiety disorder, unspecified: Secondary | ICD-10-CM | POA: Diagnosis not present

## 2016-01-15 DIAGNOSIS — M21372 Foot drop, left foot: Secondary | ICD-10-CM | POA: Diagnosis not present

## 2016-01-15 DIAGNOSIS — R2689 Other abnormalities of gait and mobility: Secondary | ICD-10-CM | POA: Diagnosis not present

## 2016-01-16 DIAGNOSIS — G35 Multiple sclerosis: Secondary | ICD-10-CM | POA: Diagnosis not present

## 2016-01-17 DIAGNOSIS — F419 Anxiety disorder, unspecified: Secondary | ICD-10-CM | POA: Diagnosis not present

## 2016-01-17 DIAGNOSIS — Z9181 History of falling: Secondary | ICD-10-CM | POA: Diagnosis not present

## 2016-01-17 DIAGNOSIS — F329 Major depressive disorder, single episode, unspecified: Secondary | ICD-10-CM | POA: Diagnosis not present

## 2016-01-17 DIAGNOSIS — G35 Multiple sclerosis: Secondary | ICD-10-CM | POA: Diagnosis not present

## 2016-01-17 DIAGNOSIS — R2689 Other abnormalities of gait and mobility: Secondary | ICD-10-CM | POA: Diagnosis not present

## 2016-01-17 DIAGNOSIS — M21372 Foot drop, left foot: Secondary | ICD-10-CM | POA: Diagnosis not present

## 2016-01-20 DIAGNOSIS — R2689 Other abnormalities of gait and mobility: Secondary | ICD-10-CM | POA: Diagnosis not present

## 2016-01-20 DIAGNOSIS — M21372 Foot drop, left foot: Secondary | ICD-10-CM | POA: Diagnosis not present

## 2016-01-20 DIAGNOSIS — F329 Major depressive disorder, single episode, unspecified: Secondary | ICD-10-CM | POA: Diagnosis not present

## 2016-01-20 DIAGNOSIS — Z9181 History of falling: Secondary | ICD-10-CM | POA: Diagnosis not present

## 2016-01-20 DIAGNOSIS — F419 Anxiety disorder, unspecified: Secondary | ICD-10-CM | POA: Diagnosis not present

## 2016-01-20 DIAGNOSIS — G35 Multiple sclerosis: Secondary | ICD-10-CM | POA: Diagnosis not present

## 2016-01-22 DIAGNOSIS — G35 Multiple sclerosis: Secondary | ICD-10-CM | POA: Diagnosis not present

## 2016-01-23 DIAGNOSIS — G35 Multiple sclerosis: Secondary | ICD-10-CM | POA: Diagnosis not present

## 2016-01-23 DIAGNOSIS — F329 Major depressive disorder, single episode, unspecified: Secondary | ICD-10-CM | POA: Diagnosis not present

## 2016-01-23 DIAGNOSIS — R2689 Other abnormalities of gait and mobility: Secondary | ICD-10-CM | POA: Diagnosis not present

## 2016-01-23 DIAGNOSIS — F419 Anxiety disorder, unspecified: Secondary | ICD-10-CM | POA: Diagnosis not present

## 2016-01-23 DIAGNOSIS — M21372 Foot drop, left foot: Secondary | ICD-10-CM | POA: Diagnosis not present

## 2016-01-23 DIAGNOSIS — Z9181 History of falling: Secondary | ICD-10-CM | POA: Diagnosis not present

## 2016-01-24 DIAGNOSIS — G35 Multiple sclerosis: Secondary | ICD-10-CM | POA: Diagnosis not present

## 2016-01-27 DIAGNOSIS — G35 Multiple sclerosis: Secondary | ICD-10-CM | POA: Diagnosis not present

## 2016-01-28 DIAGNOSIS — Z9181 History of falling: Secondary | ICD-10-CM | POA: Diagnosis not present

## 2016-01-28 DIAGNOSIS — R2689 Other abnormalities of gait and mobility: Secondary | ICD-10-CM | POA: Diagnosis not present

## 2016-01-28 DIAGNOSIS — F419 Anxiety disorder, unspecified: Secondary | ICD-10-CM | POA: Diagnosis not present

## 2016-01-28 DIAGNOSIS — M21372 Foot drop, left foot: Secondary | ICD-10-CM | POA: Diagnosis not present

## 2016-01-28 DIAGNOSIS — G35 Multiple sclerosis: Secondary | ICD-10-CM | POA: Diagnosis not present

## 2016-01-28 DIAGNOSIS — F329 Major depressive disorder, single episode, unspecified: Secondary | ICD-10-CM | POA: Diagnosis not present

## 2016-01-29 DIAGNOSIS — G35 Multiple sclerosis: Secondary | ICD-10-CM | POA: Diagnosis not present

## 2016-01-30 DIAGNOSIS — G35 Multiple sclerosis: Secondary | ICD-10-CM | POA: Diagnosis not present

## 2016-01-31 DIAGNOSIS — Z9181 History of falling: Secondary | ICD-10-CM | POA: Diagnosis not present

## 2016-01-31 DIAGNOSIS — F419 Anxiety disorder, unspecified: Secondary | ICD-10-CM | POA: Diagnosis not present

## 2016-01-31 DIAGNOSIS — M21372 Foot drop, left foot: Secondary | ICD-10-CM | POA: Diagnosis not present

## 2016-01-31 DIAGNOSIS — G35 Multiple sclerosis: Secondary | ICD-10-CM | POA: Diagnosis not present

## 2016-01-31 DIAGNOSIS — F329 Major depressive disorder, single episode, unspecified: Secondary | ICD-10-CM | POA: Diagnosis not present

## 2016-01-31 DIAGNOSIS — R2689 Other abnormalities of gait and mobility: Secondary | ICD-10-CM | POA: Diagnosis not present

## 2016-02-03 DIAGNOSIS — R2689 Other abnormalities of gait and mobility: Secondary | ICD-10-CM | POA: Diagnosis not present

## 2016-02-03 DIAGNOSIS — F419 Anxiety disorder, unspecified: Secondary | ICD-10-CM | POA: Diagnosis not present

## 2016-02-03 DIAGNOSIS — G35 Multiple sclerosis: Secondary | ICD-10-CM | POA: Diagnosis not present

## 2016-02-03 DIAGNOSIS — Z9181 History of falling: Secondary | ICD-10-CM | POA: Diagnosis not present

## 2016-02-03 DIAGNOSIS — M21372 Foot drop, left foot: Secondary | ICD-10-CM | POA: Diagnosis not present

## 2016-02-03 DIAGNOSIS — F329 Major depressive disorder, single episode, unspecified: Secondary | ICD-10-CM | POA: Diagnosis not present

## 2016-02-04 DIAGNOSIS — G35 Multiple sclerosis: Secondary | ICD-10-CM | POA: Diagnosis not present

## 2016-02-05 DIAGNOSIS — G35 Multiple sclerosis: Secondary | ICD-10-CM | POA: Diagnosis not present

## 2016-02-06 DIAGNOSIS — G35 Multiple sclerosis: Secondary | ICD-10-CM | POA: Diagnosis not present

## 2016-02-07 DIAGNOSIS — G35 Multiple sclerosis: Secondary | ICD-10-CM | POA: Diagnosis not present

## 2016-02-10 DIAGNOSIS — G35 Multiple sclerosis: Secondary | ICD-10-CM | POA: Diagnosis not present

## 2016-02-11 DIAGNOSIS — G35 Multiple sclerosis: Secondary | ICD-10-CM | POA: Diagnosis not present

## 2016-02-12 DIAGNOSIS — G35 Multiple sclerosis: Secondary | ICD-10-CM | POA: Diagnosis not present

## 2016-02-13 DIAGNOSIS — G35 Multiple sclerosis: Secondary | ICD-10-CM | POA: Diagnosis not present

## 2016-02-14 DIAGNOSIS — G35 Multiple sclerosis: Secondary | ICD-10-CM | POA: Diagnosis not present

## 2016-02-17 DIAGNOSIS — G35 Multiple sclerosis: Secondary | ICD-10-CM | POA: Diagnosis not present

## 2016-02-18 DIAGNOSIS — G35 Multiple sclerosis: Secondary | ICD-10-CM | POA: Diagnosis not present

## 2016-02-19 DIAGNOSIS — G35 Multiple sclerosis: Secondary | ICD-10-CM | POA: Diagnosis not present

## 2016-02-20 DIAGNOSIS — G35 Multiple sclerosis: Secondary | ICD-10-CM | POA: Diagnosis not present

## 2016-02-21 DIAGNOSIS — G35 Multiple sclerosis: Secondary | ICD-10-CM | POA: Diagnosis not present

## 2016-02-24 DIAGNOSIS — G35 Multiple sclerosis: Secondary | ICD-10-CM | POA: Diagnosis not present

## 2016-02-25 DIAGNOSIS — G35 Multiple sclerosis: Secondary | ICD-10-CM | POA: Diagnosis not present

## 2016-02-26 DIAGNOSIS — G35 Multiple sclerosis: Secondary | ICD-10-CM | POA: Diagnosis not present

## 2016-02-27 DIAGNOSIS — G35 Multiple sclerosis: Secondary | ICD-10-CM | POA: Diagnosis not present

## 2016-03-04 DIAGNOSIS — R2689 Other abnormalities of gait and mobility: Secondary | ICD-10-CM | POA: Diagnosis not present

## 2016-03-04 DIAGNOSIS — G35 Multiple sclerosis: Secondary | ICD-10-CM | POA: Diagnosis not present

## 2016-03-23 DIAGNOSIS — G35 Multiple sclerosis: Secondary | ICD-10-CM | POA: Diagnosis not present

## 2016-03-23 DIAGNOSIS — R32 Unspecified urinary incontinence: Secondary | ICD-10-CM | POA: Diagnosis not present

## 2016-04-01 DIAGNOSIS — Z79899 Other long term (current) drug therapy: Secondary | ICD-10-CM | POA: Diagnosis not present

## 2016-04-01 DIAGNOSIS — F09 Unspecified mental disorder due to known physiological condition: Secondary | ICD-10-CM | POA: Diagnosis not present

## 2016-04-01 DIAGNOSIS — F419 Anxiety disorder, unspecified: Secondary | ICD-10-CM | POA: Diagnosis not present

## 2016-04-01 DIAGNOSIS — N289 Disorder of kidney and ureter, unspecified: Secondary | ICD-10-CM | POA: Diagnosis not present

## 2016-04-01 DIAGNOSIS — F329 Major depressive disorder, single episode, unspecified: Secondary | ICD-10-CM | POA: Diagnosis not present

## 2016-04-01 DIAGNOSIS — G4459 Other complicated headache syndrome: Secondary | ICD-10-CM | POA: Diagnosis not present

## 2016-04-01 DIAGNOSIS — G35 Multiple sclerosis: Secondary | ICD-10-CM | POA: Diagnosis not present

## 2016-04-01 DIAGNOSIS — R270 Ataxia, unspecified: Secondary | ICD-10-CM | POA: Diagnosis not present

## 2016-04-04 DIAGNOSIS — R2689 Other abnormalities of gait and mobility: Secondary | ICD-10-CM | POA: Diagnosis not present

## 2016-04-04 DIAGNOSIS — G35 Multiple sclerosis: Secondary | ICD-10-CM | POA: Diagnosis not present

## 2016-04-22 ENCOUNTER — Other Ambulatory Visit: Payer: Self-pay | Admitting: Internal Medicine

## 2016-04-22 DIAGNOSIS — Z1231 Encounter for screening mammogram for malignant neoplasm of breast: Secondary | ICD-10-CM

## 2016-04-29 ENCOUNTER — Ambulatory Visit: Payer: Commercial Managed Care - HMO

## 2016-05-05 DIAGNOSIS — G35 Multiple sclerosis: Secondary | ICD-10-CM | POA: Diagnosis not present

## 2016-05-05 DIAGNOSIS — R2689 Other abnormalities of gait and mobility: Secondary | ICD-10-CM | POA: Diagnosis not present

## 2016-05-22 ENCOUNTER — Ambulatory Visit: Payer: Commercial Managed Care - HMO

## 2016-05-29 ENCOUNTER — Ambulatory Visit
Admission: RE | Admit: 2016-05-29 | Discharge: 2016-05-29 | Disposition: A | Discharge status: Home / Self Care | Payer: Commercial Managed Care - HMO | Source: Ambulatory Visit | Attending: Internal Medicine | Admitting: Internal Medicine

## 2016-05-29 ENCOUNTER — Ambulatory Visit: Payer: Commercial Managed Care - HMO

## 2016-05-29 DIAGNOSIS — Z1231 Encounter for screening mammogram for malignant neoplasm of breast: Secondary | ICD-10-CM | POA: Diagnosis not present

## 2016-06-01 DIAGNOSIS — F330 Major depressive disorder, recurrent, mild: Secondary | ICD-10-CM | POA: Diagnosis not present

## 2016-06-01 DIAGNOSIS — Z124 Encounter for screening for malignant neoplasm of cervix: Secondary | ICD-10-CM | POA: Diagnosis not present

## 2016-06-01 DIAGNOSIS — F329 Major depressive disorder, single episode, unspecified: Secondary | ICD-10-CM | POA: Diagnosis not present

## 2016-06-01 DIAGNOSIS — Z01411 Encounter for gynecological examination (general) (routine) with abnormal findings: Secondary | ICD-10-CM | POA: Diagnosis not present

## 2016-06-01 DIAGNOSIS — Z1389 Encounter for screening for other disorder: Secondary | ICD-10-CM | POA: Diagnosis not present

## 2016-06-01 DIAGNOSIS — Z0000 Encounter for general adult medical examination without abnormal findings: Secondary | ICD-10-CM | POA: Diagnosis not present

## 2016-08-03 DIAGNOSIS — F419 Anxiety disorder, unspecified: Secondary | ICD-10-CM | POA: Diagnosis not present

## 2016-08-03 DIAGNOSIS — N289 Disorder of kidney and ureter, unspecified: Secondary | ICD-10-CM | POA: Diagnosis not present

## 2016-08-03 DIAGNOSIS — G4459 Other complicated headache syndrome: Secondary | ICD-10-CM | POA: Diagnosis not present

## 2016-08-03 DIAGNOSIS — R270 Ataxia, unspecified: Secondary | ICD-10-CM | POA: Diagnosis not present

## 2016-08-03 DIAGNOSIS — F329 Major depressive disorder, single episode, unspecified: Secondary | ICD-10-CM | POA: Diagnosis not present

## 2016-08-03 DIAGNOSIS — Z79899 Other long term (current) drug therapy: Secondary | ICD-10-CM | POA: Diagnosis not present

## 2016-08-03 DIAGNOSIS — G35 Multiple sclerosis: Secondary | ICD-10-CM | POA: Diagnosis not present

## 2016-10-20 DIAGNOSIS — Z0000 Encounter for general adult medical examination without abnormal findings: Secondary | ICD-10-CM | POA: Diagnosis not present

## 2016-10-20 DIAGNOSIS — Z681 Body mass index (BMI) 19 or less, adult: Secondary | ICD-10-CM | POA: Diagnosis not present

## 2016-10-20 DIAGNOSIS — Z1389 Encounter for screening for other disorder: Secondary | ICD-10-CM | POA: Diagnosis not present

## 2016-12-18 DIAGNOSIS — L089 Local infection of the skin and subcutaneous tissue, unspecified: Secondary | ICD-10-CM | POA: Diagnosis not present

## 2016-12-18 DIAGNOSIS — Z6824 Body mass index (BMI) 24.0-24.9, adult: Secondary | ICD-10-CM | POA: Diagnosis not present

## 2016-12-18 DIAGNOSIS — R2242 Localized swelling, mass and lump, left lower limb: Secondary | ICD-10-CM | POA: Diagnosis not present

## 2017-01-06 DIAGNOSIS — Z6824 Body mass index (BMI) 24.0-24.9, adult: Secondary | ICD-10-CM | POA: Diagnosis not present

## 2017-01-06 DIAGNOSIS — F419 Anxiety disorder, unspecified: Secondary | ICD-10-CM | POA: Diagnosis not present

## 2017-01-06 DIAGNOSIS — L299 Pruritus, unspecified: Secondary | ICD-10-CM | POA: Diagnosis not present

## 2017-01-06 DIAGNOSIS — F330 Major depressive disorder, recurrent, mild: Secondary | ICD-10-CM | POA: Diagnosis not present

## 2017-01-06 DIAGNOSIS — G35 Multiple sclerosis: Secondary | ICD-10-CM | POA: Diagnosis not present

## 2017-01-06 DIAGNOSIS — J302 Other seasonal allergic rhinitis: Secondary | ICD-10-CM | POA: Diagnosis not present

## 2017-01-06 DIAGNOSIS — L089 Local infection of the skin and subcutaneous tissue, unspecified: Secondary | ICD-10-CM | POA: Diagnosis not present

## 2017-02-01 DIAGNOSIS — G4459 Other complicated headache syndrome: Secondary | ICD-10-CM | POA: Diagnosis not present

## 2017-02-01 DIAGNOSIS — G35 Multiple sclerosis: Secondary | ICD-10-CM | POA: Diagnosis not present

## 2017-02-01 DIAGNOSIS — R270 Ataxia, unspecified: Secondary | ICD-10-CM | POA: Diagnosis not present

## 2017-02-01 DIAGNOSIS — F419 Anxiety disorder, unspecified: Secondary | ICD-10-CM | POA: Diagnosis not present

## 2017-02-01 DIAGNOSIS — F09 Unspecified mental disorder due to known physiological condition: Secondary | ICD-10-CM | POA: Diagnosis not present

## 2017-02-01 DIAGNOSIS — Z79899 Other long term (current) drug therapy: Secondary | ICD-10-CM | POA: Diagnosis not present

## 2017-02-01 DIAGNOSIS — N289 Disorder of kidney and ureter, unspecified: Secondary | ICD-10-CM | POA: Diagnosis not present

## 2017-02-01 DIAGNOSIS — F329 Major depressive disorder, single episode, unspecified: Secondary | ICD-10-CM | POA: Diagnosis not present

## 2017-02-20 DIAGNOSIS — G35 Multiple sclerosis: Secondary | ICD-10-CM | POA: Diagnosis not present

## 2017-02-20 DIAGNOSIS — R32 Unspecified urinary incontinence: Secondary | ICD-10-CM | POA: Diagnosis not present

## 2017-03-23 DIAGNOSIS — R32 Unspecified urinary incontinence: Secondary | ICD-10-CM | POA: Diagnosis not present

## 2017-03-23 DIAGNOSIS — G35 Multiple sclerosis: Secondary | ICD-10-CM | POA: Diagnosis not present

## 2017-04-23 DIAGNOSIS — R32 Unspecified urinary incontinence: Secondary | ICD-10-CM | POA: Diagnosis not present

## 2017-04-23 DIAGNOSIS — G35 Multiple sclerosis: Secondary | ICD-10-CM | POA: Diagnosis not present

## 2017-04-26 ENCOUNTER — Other Ambulatory Visit: Payer: Self-pay | Admitting: Internal Medicine

## 2017-04-26 DIAGNOSIS — Z1231 Encounter for screening mammogram for malignant neoplasm of breast: Secondary | ICD-10-CM

## 2017-05-23 DIAGNOSIS — G35 Multiple sclerosis: Secondary | ICD-10-CM | POA: Diagnosis not present

## 2017-05-23 DIAGNOSIS — R32 Unspecified urinary incontinence: Secondary | ICD-10-CM | POA: Diagnosis not present

## 2017-05-31 ENCOUNTER — Ambulatory Visit
Admission: RE | Admit: 2017-05-31 | Discharge: 2017-05-31 | Disposition: A | Discharge status: Home / Self Care | Payer: Commercial Managed Care - HMO | Source: Ambulatory Visit | Attending: Internal Medicine | Admitting: Internal Medicine

## 2017-05-31 DIAGNOSIS — Z1231 Encounter for screening mammogram for malignant neoplasm of breast: Secondary | ICD-10-CM | POA: Diagnosis not present

## 2017-06-23 DIAGNOSIS — R32 Unspecified urinary incontinence: Secondary | ICD-10-CM | POA: Diagnosis not present

## 2017-06-23 DIAGNOSIS — G35 Multiple sclerosis: Secondary | ICD-10-CM | POA: Diagnosis not present

## 2017-07-23 DIAGNOSIS — G35 Multiple sclerosis: Secondary | ICD-10-CM | POA: Diagnosis not present

## 2017-07-23 DIAGNOSIS — R32 Unspecified urinary incontinence: Secondary | ICD-10-CM | POA: Diagnosis not present

## 2017-08-23 DIAGNOSIS — G35 Multiple sclerosis: Secondary | ICD-10-CM | POA: Diagnosis not present

## 2017-08-23 DIAGNOSIS — R32 Unspecified urinary incontinence: Secondary | ICD-10-CM | POA: Diagnosis not present

## 2017-09-21 DIAGNOSIS — G35 Multiple sclerosis: Secondary | ICD-10-CM | POA: Diagnosis not present

## 2017-09-21 DIAGNOSIS — R260 Ataxic gait: Secondary | ICD-10-CM | POA: Diagnosis not present

## 2017-09-21 DIAGNOSIS — Z79899 Other long term (current) drug therapy: Secondary | ICD-10-CM | POA: Diagnosis not present

## 2017-09-21 DIAGNOSIS — G3184 Mild cognitive impairment, so stated: Secondary | ICD-10-CM | POA: Diagnosis not present

## 2017-09-23 DIAGNOSIS — G35 Multiple sclerosis: Secondary | ICD-10-CM | POA: Diagnosis not present

## 2017-09-23 DIAGNOSIS — R32 Unspecified urinary incontinence: Secondary | ICD-10-CM | POA: Diagnosis not present

## 2017-10-21 DIAGNOSIS — G35 Multiple sclerosis: Secondary | ICD-10-CM | POA: Diagnosis not present

## 2017-10-21 DIAGNOSIS — R32 Unspecified urinary incontinence: Secondary | ICD-10-CM | POA: Diagnosis not present

## 2017-11-17 DIAGNOSIS — F0632 Mood disorder due to known physiological condition with major depressive-like episode: Secondary | ICD-10-CM | POA: Diagnosis not present

## 2017-11-17 DIAGNOSIS — G3184 Mild cognitive impairment, so stated: Secondary | ICD-10-CM | POA: Diagnosis not present

## 2017-11-17 DIAGNOSIS — G35 Multiple sclerosis: Secondary | ICD-10-CM | POA: Diagnosis not present

## 2017-11-17 DIAGNOSIS — R260 Ataxic gait: Secondary | ICD-10-CM | POA: Diagnosis not present

## 2017-11-21 DIAGNOSIS — R32 Unspecified urinary incontinence: Secondary | ICD-10-CM | POA: Diagnosis not present

## 2017-11-21 DIAGNOSIS — G35 Multiple sclerosis: Secondary | ICD-10-CM | POA: Diagnosis not present

## 2017-12-21 DIAGNOSIS — R32 Unspecified urinary incontinence: Secondary | ICD-10-CM | POA: Diagnosis not present

## 2017-12-21 DIAGNOSIS — G35 Multiple sclerosis: Secondary | ICD-10-CM | POA: Diagnosis not present

## 2018-01-21 DIAGNOSIS — R32 Unspecified urinary incontinence: Secondary | ICD-10-CM | POA: Diagnosis not present

## 2018-01-21 DIAGNOSIS — G35 Multiple sclerosis: Secondary | ICD-10-CM | POA: Diagnosis not present

## 2018-01-26 DIAGNOSIS — F330 Major depressive disorder, recurrent, mild: Secondary | ICD-10-CM | POA: Diagnosis not present

## 2018-01-26 DIAGNOSIS — Z1389 Encounter for screening for other disorder: Secondary | ICD-10-CM | POA: Diagnosis not present

## 2018-01-26 DIAGNOSIS — Z6824 Body mass index (BMI) 24.0-24.9, adult: Secondary | ICD-10-CM | POA: Diagnosis not present

## 2018-01-26 DIAGNOSIS — Z0001 Encounter for general adult medical examination with abnormal findings: Secondary | ICD-10-CM | POA: Diagnosis not present

## 2018-01-26 DIAGNOSIS — G35 Multiple sclerosis: Secondary | ICD-10-CM | POA: Diagnosis not present

## 2018-01-26 DIAGNOSIS — Z0000 Encounter for general adult medical examination without abnormal findings: Secondary | ICD-10-CM | POA: Diagnosis not present

## 2018-02-08 ENCOUNTER — Encounter: Payer: Self-pay | Admitting: Internal Medicine

## 2018-02-20 DIAGNOSIS — G35 Multiple sclerosis: Secondary | ICD-10-CM | POA: Diagnosis not present

## 2018-02-20 DIAGNOSIS — R32 Unspecified urinary incontinence: Secondary | ICD-10-CM | POA: Diagnosis not present

## 2018-03-22 DIAGNOSIS — G3184 Mild cognitive impairment, so stated: Secondary | ICD-10-CM | POA: Diagnosis not present

## 2018-03-22 DIAGNOSIS — F0632 Mood disorder due to known physiological condition with major depressive-like episode: Secondary | ICD-10-CM | POA: Diagnosis not present

## 2018-03-22 DIAGNOSIS — R260 Ataxic gait: Secondary | ICD-10-CM | POA: Diagnosis not present

## 2018-03-22 DIAGNOSIS — G35 Multiple sclerosis: Secondary | ICD-10-CM | POA: Diagnosis not present

## 2018-03-23 DIAGNOSIS — G35 Multiple sclerosis: Secondary | ICD-10-CM | POA: Diagnosis not present

## 2018-03-23 DIAGNOSIS — R32 Unspecified urinary incontinence: Secondary | ICD-10-CM | POA: Diagnosis not present

## 2018-04-01 DIAGNOSIS — G35 Multiple sclerosis: Secondary | ICD-10-CM | POA: Diagnosis not present

## 2018-04-01 DIAGNOSIS — R5383 Other fatigue: Secondary | ICD-10-CM | POA: Diagnosis not present

## 2018-04-01 DIAGNOSIS — Z79899 Other long term (current) drug therapy: Secondary | ICD-10-CM | POA: Diagnosis not present

## 2018-04-23 DIAGNOSIS — G35 Multiple sclerosis: Secondary | ICD-10-CM | POA: Diagnosis not present

## 2018-04-23 DIAGNOSIS — R32 Unspecified urinary incontinence: Secondary | ICD-10-CM | POA: Diagnosis not present

## 2018-05-11 ENCOUNTER — Ambulatory Visit: Payer: Commercial Managed Care - HMO | Admitting: Nurse Practitioner

## 2018-05-23 DIAGNOSIS — G35 Multiple sclerosis: Secondary | ICD-10-CM | POA: Diagnosis not present

## 2018-05-23 DIAGNOSIS — R32 Unspecified urinary incontinence: Secondary | ICD-10-CM | POA: Diagnosis not present

## 2018-06-07 ENCOUNTER — Other Ambulatory Visit: Payer: Self-pay | Admitting: Internal Medicine

## 2018-06-07 DIAGNOSIS — Z1231 Encounter for screening mammogram for malignant neoplasm of breast: Secondary | ICD-10-CM

## 2018-07-19 ENCOUNTER — Ambulatory Visit
Admission: RE | Admit: 2018-07-19 | Discharge: 2018-07-19 | Disposition: A | Discharge status: Home / Self Care | Payer: Medicare HMO | Source: Ambulatory Visit | Attending: Internal Medicine | Admitting: Internal Medicine

## 2018-07-19 DIAGNOSIS — Z1231 Encounter for screening mammogram for malignant neoplasm of breast: Secondary | ICD-10-CM | POA: Diagnosis not present

## 2018-07-23 DIAGNOSIS — R32 Unspecified urinary incontinence: Secondary | ICD-10-CM | POA: Diagnosis not present

## 2018-07-23 DIAGNOSIS — G35 Multiple sclerosis: Secondary | ICD-10-CM | POA: Diagnosis not present

## 2018-08-03 ENCOUNTER — Ambulatory Visit: Payer: Commercial Managed Care - HMO | Admitting: Nurse Practitioner

## 2018-08-23 DIAGNOSIS — R32 Unspecified urinary incontinence: Secondary | ICD-10-CM | POA: Diagnosis not present

## 2018-08-23 DIAGNOSIS — G35 Multiple sclerosis: Secondary | ICD-10-CM | POA: Diagnosis not present

## 2018-09-23 DIAGNOSIS — R32 Unspecified urinary incontinence: Secondary | ICD-10-CM | POA: Diagnosis not present

## 2018-09-23 DIAGNOSIS — G35 Multiple sclerosis: Secondary | ICD-10-CM | POA: Diagnosis not present

## 2018-10-17 ENCOUNTER — Other Ambulatory Visit: Payer: Self-pay

## 2018-10-17 ENCOUNTER — Encounter: Payer: Self-pay | Admitting: Nurse Practitioner

## 2018-10-17 ENCOUNTER — Telehealth: Payer: Self-pay

## 2018-10-17 ENCOUNTER — Ambulatory Visit (INDEPENDENT_AMBULATORY_CARE_PROVIDER_SITE_OTHER): Payer: Medicare HMO | Admitting: Nurse Practitioner

## 2018-10-17 DIAGNOSIS — R69 Illness, unspecified: Secondary | ICD-10-CM

## 2018-10-17 DIAGNOSIS — Z1211 Encounter for screening for malignant neoplasm of colon: Secondary | ICD-10-CM

## 2018-10-17 MED ORDER — PEG 3350-KCL-NA BICARB-NACL 420 G PO SOLR: 4000.0000 mL | ORAL | 0 refills | Status: DC

## 2018-10-17 NOTE — Patient Instructions (Signed)
Your health issues we discussed today were:   Need for colonoscopy: 1. We will schedule your colonoscopy for you 2. Further recommendations will be made after your colonoscopy  Overall I recommend:  1. Follow-up based on recommendations made after your colonoscopy, or as needed for GI symptoms 2. Call us if you have any questions or concerns.  At [facility] we value your feedback. You may receive a survey about your visit today. Please share your experience as we strive to create trusting relationships with our patients to provide genuine, compassionate, quality care.  We appreciate your understanding and patience as we review any laboratory studies, imaging, and other diagnostic tests that are ordered as we care for you. Our office policy is 5 business days for review of these results, and any emergent or urgent results are addressed in a timely manner for your best interest. If you do not hear from our office in 1 week, please contact us.   We also encourage the use of MyChart, which contains your medical information for your review as well. If you are not enrolled in this feature, an access code is on this after visit summary for your convenience. Thank you for allowing [place] to be involved in your care.  It was great to see you today!  I hope you have a great day!!

## 2018-10-17 NOTE — Assessment & Plan Note (Signed)
The patient is currently due for first-ever screening colonoscopy.  Has never had a colonoscopy before.  Is generally asymptomatic from a GI standpoint.  She was brought into the office due to multiple medications.  She does not remember her medications will have to call to confirm.  She generally except she takes Wellbutrin, Prozac.  She seems quite anxious about her colonoscopy.  We will proceed at this time.  Proceed with TCS on propofol/MAC with Dr. Gala Romney in near future: the risks, benefits, and alternatives have been discussed with the patient in detail. The patient states understanding and desires to proceed.  The patient is currently on Wellbutrin, Prozac.  Other medications to be added but based on discussion does not appear to be on any other sedating medicines, anticoagulants.  Given her anxiety about the procedure and her medications we will plan for the procedure on propofol/MAC to promote adequate sedation.

## 2018-10-17 NOTE — Telephone Encounter (Signed)
Called and informed pt of pre-op appt 12/01/18 at 1:15pm. Letter mailed.

## 2018-10-17 NOTE — Progress Notes (Addendum)
Primary Care Physician:  Redmond School, MD Primary Gastroenterologist:  Dr. Gala Romney  Chief Complaint  Patient presents with  . Consult    FH colon cancer-MGM    HPI:   Erin Berg is a 53 y.o. female who presents on referral from primary care for colonoscopy.  Nurse/phone triage was deferred to office visit due to medications likely necessitating augmented sedation.  Information from referral indicate patient's weight on January 26, 2018 was 150 pounds and has a history of body mass index between 24.0 and 24.9.  Based on her previous primary care weight and today's patient stated height her BMI would be 25.0.  No previous colonoscopy in our system.  Today she states he is never had a colonoscopy before.  The patient states that she did not bring a list of her medications with her.  She is not sure what medication she takes.  (Nursing staff in our office is actively trying to get a list from her pharmacy). Shge takes her MS medications and Wellbutrin, not on Prozac currently or Xanax. Denies abdominal pain, N/V, hematochezia, melena, fever, chills, unintentional weight loss. Denies chest pain, dyspnea, dizziness, lightheadedness, syncope, near syncope. Denies any other upper or lower GI symptoms.  Past Medical History:  Diagnosis Date  . Anxiety and depression   . MS (multiple sclerosis) ([place])   . Multiple sclerosis ([place])     Past Surgical History:  Procedure Laterality Date  . TONSILLECTOMY AND ADENOIDECTOMY      Current Outpatient Medications  Medication Sig Dispense Refill  . buPROPion (WELLBUTRIN) 100 MG tablet Take 30 mg by mouth daily.    . ergocalciferol (VITAMIN D2) 1.25 MG (50000 UT) capsule Take 50,000 Units by mouth once a week.    Marland Kitchen FLUoxetine (PROZAC) 40 MG capsule Take 20 mg by mouth daily.     . Omega-3 Fatty Acids (FISH OIL PO) Take by mouth 2 (two) times daily.    . sertraline (ZOLOFT) 50 MG tablet Take 50 mg by mouth daily.    . TECFIDERA 240 MG CPDR 1  capsule 2 (two) times daily.     Marland Kitchen ALPRAZolam (XANAX) 0.5 MG tablet Take 0.5 mg by mouth 3 (three) times daily as needed for anxiety.    . AMPYRA 10 MG TB12     . cholecalciferol (VITAMIN D) 1000 UNITS tablet Take 5,000 Units by mouth daily.    . ciprofloxacin (CIPRO) 500 MG tablet Take 500 mg by mouth 2 (two) times daily.    . mirtazapine (REMERON) 30 MG tablet Take 30 mg by mouth at bedtime.    Marland Kitchen oxybutynin (DITROPAN) 5 MG tablet Take 10 mg by mouth daily.    . polyethylene glycol-electrolytes (TRILYTE) 420 g solution Take 4,000 mLs by mouth as directed. 4000 mL 0  . terbinafine (LAMISIL) 250 MG tablet      No current facility-administered medications for this visit.     Allergies as of 10/17/2018 - Review Complete 10/17/2018  Allergen Reaction Noted  . Sulfamethoxazole-trimethoprim  09/30/2007    Family History  Problem Relation Age of Onset  . Colon cancer Maternal Grandmother     Social History   Socioeconomic History  . Marital status: Unknown    Spouse name: Not on file  . Number of children: Not on file  . Years of education: Not on file  . Highest education level: Not on file  Occupational History  . Not on file  Social Needs  . Financial resource strain: Not  on file  . Food insecurity:    Worry: Not on file    Inability: Not on file  . Transportation needs:    Medical: Not on file    Non-medical: Not on file  Tobacco Use  . Smoking status: Former Smoker    Types: Cigarettes  . Smokeless tobacco: Never Used  Substance and Sexual Activity  . Alcohol use: No    Alcohol/week: 0.0 standard drinks  . Drug use: No  . Sexual activity: Not on file  Lifestyle  . Physical activity:    Days per week: Not on file    Minutes per session: Not on file  . Stress: Not on file  Relationships  . Social connections:    Talks on phone: Not on file    Gets together: Not on file    Attends religious service: Not on file    Active member of club or organization: Not on  file    Attends meetings of clubs or organizations: Not on file    Relationship status: Not on file  . Intimate partner violence:    Fear of current or ex partner: Not on file    Emotionally abused: Not on file    Physically abused: Not on file    Forced sexual activity: Not on file  Other Topics Concern  . Not on file  Social History Narrative  . Not on file    Review of Systems: General: Negative for anorexia, weight loss, fever, chills, fatigue, weakness. ENT: Negative for hoarseness, difficulty swallowing. CV: Negative for chest pain, angina, palpitations, peripheral edema.  Respiratory: Negative for dyspnea at rest, cough, sputum, wheezing.  GI: See history of present illness. MS: Negative for joint pain, low back pain.  Derm: Negative for rash or itching.  Neuro: Admits Multiple Sclerosis, unable to walk and uses a wheelchair.  Endo: Negative for unusual weight change.  Heme: Negative for bruising or bleeding. Allergy: Negative for rash or hives.    Physical Exam: BP 133/78   Pulse 68   Temp (!) 97.3 F (36.3 C) (Oral)   Ht 5\' 5"  (1.651 m)   LMP 10/03/2018   BMI 32.62 kg/m  General:   Alert and oriented. Pleasant and cooperative. Well-nourished and well-developed.  Head:  Normocephalic and atraumatic. Eyes:  Without icterus, sclera clear and conjunctiva pink.  Ears:  Normal auditory acuity. Cardiovascular:  S1, S2 present without murmurs appreciated. Extremities without clubbing or edema. Respiratory:  Clear to auscultation bilaterally. No wheezes, rales, or rhonchi. No distress.  Gastrointestinal:  +BS, soft, non-tender and non-distended. No HSM noted. No guarding or rebound. No masses appreciated.  Rectal:  Deferred  Musculoskalatal:  Symmetrical without gross deformities. Skin:  Intact without significant lesions or rashes. Neurologic:  Alert and oriented x4;  Sitting in wheelchair due to MS/stated difficulty wih ambulation. Psych:  Alert and cooperative.  Normal mood and affect. Heme/Lymph/Immune: No excessive bruising noted.    10/18/2018 11:49 AM   Disclaimer: This note was dictated with voice recognition software. Similar sounding words can inadvertently be transcribed and may not be corrected upon review.

## 2018-10-17 NOTE — Progress Notes (Signed)
CC'D TO PCP °

## 2018-10-18 ENCOUNTER — Encounter: Payer: Self-pay | Admitting: Nurse Practitioner

## 2018-11-24 ENCOUNTER — Telehealth: Payer: Self-pay

## 2018-11-24 NOTE — Telephone Encounter (Signed)
Pre-op appt rescheduled to 01/17/19 at 9:00am. Appt letter mailed with procedure instructions.

## 2018-11-24 NOTE — Telephone Encounter (Signed)
TCS w/Propofol w/RMR that was for 12/08/18 will need to be rescheduled d/t COVID-19 restrictions. Called pt, TCS rescheduled to 01/23/19 at 1:15pm. Will mail new instructions after pre-op appt is rescheduled.

## 2018-12-01 ENCOUNTER — Other Ambulatory Visit (HOSPITAL_COMMUNITY): Payer: Medicare HMO

## 2019-01-16 ENCOUNTER — Telehealth: Payer: Self-pay | Admitting: *Deleted

## 2019-01-16 NOTE — Telephone Encounter (Signed)
Erin Berg from endo left a message stating that she spoke with patient on Friday and she did not want to have procedure done at this time and would r/s at a later date. She has cancelled patient off.  FYI to EG

## 2019-01-17 ENCOUNTER — Inpatient Hospital Stay (HOSPITAL_COMMUNITY): Admission: RE | Admit: 2019-01-17 | Payer: Medicare HMO | Source: Ambulatory Visit

## 2019-01-22 DIAGNOSIS — G35 Multiple sclerosis: Secondary | ICD-10-CM | POA: Diagnosis not present

## 2019-01-22 DIAGNOSIS — R32 Unspecified urinary incontinence: Secondary | ICD-10-CM | POA: Diagnosis not present

## 2019-01-23 ENCOUNTER — Ambulatory Visit (HOSPITAL_COMMUNITY): Admission: RE | Admit: 2019-01-23 | Payer: Medicare HMO | Source: Ambulatory Visit | Admitting: Internal Medicine

## 2019-01-23 ENCOUNTER — Encounter (HOSPITAL_COMMUNITY): Admission: RE | Payer: Self-pay | Source: Ambulatory Visit

## 2019-01-23 SURGERY — COLONOSCOPY WITH PROPOFOL
Anesthesia: Monitor Anesthesia Care

## 2019-02-21 DIAGNOSIS — R32 Unspecified urinary incontinence: Secondary | ICD-10-CM | POA: Diagnosis not present

## 2019-02-21 DIAGNOSIS — G35 Multiple sclerosis: Secondary | ICD-10-CM | POA: Diagnosis not present

## 2019-03-24 DIAGNOSIS — G35 Multiple sclerosis: Secondary | ICD-10-CM | POA: Diagnosis not present

## 2019-03-24 DIAGNOSIS — R32 Unspecified urinary incontinence: Secondary | ICD-10-CM | POA: Diagnosis not present

## 2019-04-28 DIAGNOSIS — R32 Unspecified urinary incontinence: Secondary | ICD-10-CM | POA: Diagnosis not present

## 2019-04-28 DIAGNOSIS — G35 Multiple sclerosis: Secondary | ICD-10-CM | POA: Diagnosis not present

## 2019-05-02 DIAGNOSIS — Z681 Body mass index (BMI) 19 or less, adult: Secondary | ICD-10-CM | POA: Diagnosis not present

## 2019-05-02 DIAGNOSIS — Z1389 Encounter for screening for other disorder: Secondary | ICD-10-CM | POA: Diagnosis not present

## 2019-05-02 DIAGNOSIS — Z0001 Encounter for general adult medical examination with abnormal findings: Secondary | ICD-10-CM | POA: Diagnosis not present

## 2019-05-02 DIAGNOSIS — Z0000 Encounter for general adult medical examination without abnormal findings: Secondary | ICD-10-CM | POA: Diagnosis not present

## 2019-05-22 DIAGNOSIS — G3184 Mild cognitive impairment, so stated: Secondary | ICD-10-CM | POA: Diagnosis not present

## 2019-05-22 DIAGNOSIS — F0632 Mood disorder due to known physiological condition with major depressive-like episode: Secondary | ICD-10-CM | POA: Diagnosis not present

## 2019-05-22 DIAGNOSIS — G35 Multiple sclerosis: Secondary | ICD-10-CM | POA: Diagnosis not present

## 2019-05-22 DIAGNOSIS — R260 Ataxic gait: Secondary | ICD-10-CM | POA: Diagnosis not present

## 2019-05-28 DIAGNOSIS — G35 Multiple sclerosis: Secondary | ICD-10-CM | POA: Diagnosis not present

## 2019-05-28 DIAGNOSIS — R32 Unspecified urinary incontinence: Secondary | ICD-10-CM | POA: Diagnosis not present

## 2019-06-12 ENCOUNTER — Other Ambulatory Visit: Payer: Self-pay | Admitting: Internal Medicine

## 2019-06-12 DIAGNOSIS — Z1231 Encounter for screening mammogram for malignant neoplasm of breast: Secondary | ICD-10-CM

## 2019-06-28 DIAGNOSIS — R32 Unspecified urinary incontinence: Secondary | ICD-10-CM | POA: Diagnosis not present

## 2019-06-28 DIAGNOSIS — G35 Multiple sclerosis: Secondary | ICD-10-CM | POA: Diagnosis not present

## 2019-08-02 ENCOUNTER — Other Ambulatory Visit: Payer: Self-pay

## 2019-08-02 ENCOUNTER — Ambulatory Visit
Admission: RE | Admit: 2019-08-02 | Discharge: 2019-08-02 | Disposition: A | Discharge status: Home / Self Care | Payer: Medicare HMO | Source: Ambulatory Visit | Attending: Internal Medicine | Admitting: Internal Medicine

## 2019-08-02 DIAGNOSIS — Z1231 Encounter for screening mammogram for malignant neoplasm of breast: Secondary | ICD-10-CM

## 2019-09-28 DIAGNOSIS — R32 Unspecified urinary incontinence: Secondary | ICD-10-CM | POA: Diagnosis not present

## 2019-09-28 DIAGNOSIS — G35 Multiple sclerosis: Secondary | ICD-10-CM | POA: Diagnosis not present

## 2019-10-26 DIAGNOSIS — R32 Unspecified urinary incontinence: Secondary | ICD-10-CM | POA: Diagnosis not present

## 2019-10-26 DIAGNOSIS — G35 Multiple sclerosis: Secondary | ICD-10-CM | POA: Diagnosis not present

## 2019-11-26 DIAGNOSIS — R32 Unspecified urinary incontinence: Secondary | ICD-10-CM | POA: Diagnosis not present

## 2019-11-26 DIAGNOSIS — G35 Multiple sclerosis: Secondary | ICD-10-CM | POA: Diagnosis not present

## 2019-12-26 DIAGNOSIS — R32 Unspecified urinary incontinence: Secondary | ICD-10-CM | POA: Diagnosis not present

## 2019-12-26 DIAGNOSIS — G35 Multiple sclerosis: Secondary | ICD-10-CM | POA: Diagnosis not present

## 2020-01-03 DIAGNOSIS — G35 Multiple sclerosis: Secondary | ICD-10-CM | POA: Diagnosis not present

## 2020-01-03 DIAGNOSIS — G3281 Cerebellar ataxia in diseases classified elsewhere: Secondary | ICD-10-CM | POA: Diagnosis not present

## 2020-01-03 DIAGNOSIS — Z79899 Other long term (current) drug therapy: Secondary | ICD-10-CM | POA: Diagnosis not present

## 2020-01-03 DIAGNOSIS — F339 Major depressive disorder, recurrent, unspecified: Secondary | ICD-10-CM | POA: Diagnosis not present

## 2020-01-11 DIAGNOSIS — Z0001 Encounter for general adult medical examination with abnormal findings: Secondary | ICD-10-CM | POA: Diagnosis not present

## 2020-01-11 DIAGNOSIS — R7309 Other abnormal glucose: Secondary | ICD-10-CM | POA: Diagnosis not present

## 2020-01-11 DIAGNOSIS — J302 Other seasonal allergic rhinitis: Secondary | ICD-10-CM | POA: Diagnosis not present

## 2020-01-11 DIAGNOSIS — G35 Multiple sclerosis: Secondary | ICD-10-CM | POA: Diagnosis not present

## 2020-01-11 DIAGNOSIS — E782 Mixed hyperlipidemia: Secondary | ICD-10-CM | POA: Diagnosis not present

## 2020-01-11 DIAGNOSIS — F330 Major depressive disorder, recurrent, mild: Secondary | ICD-10-CM | POA: Diagnosis not present

## 2020-01-11 DIAGNOSIS — Z1389 Encounter for screening for other disorder: Secondary | ICD-10-CM | POA: Diagnosis not present

## 2020-01-11 DIAGNOSIS — F329 Major depressive disorder, single episode, unspecified: Secondary | ICD-10-CM | POA: Diagnosis not present

## 2020-01-11 DIAGNOSIS — Z681 Body mass index (BMI) 19 or less, adult: Secondary | ICD-10-CM | POA: Diagnosis not present

## 2020-01-11 DIAGNOSIS — E785 Hyperlipidemia, unspecified: Secondary | ICD-10-CM | POA: Diagnosis not present

## 2020-01-12 ENCOUNTER — Other Ambulatory Visit (HOSPITAL_COMMUNITY): Payer: Self-pay | Admitting: Neurology

## 2020-01-12 ENCOUNTER — Other Ambulatory Visit: Payer: Self-pay | Admitting: Neurology

## 2020-01-12 DIAGNOSIS — G35 Multiple sclerosis: Secondary | ICD-10-CM

## 2020-01-26 DIAGNOSIS — R32 Unspecified urinary incontinence: Secondary | ICD-10-CM | POA: Diagnosis not present

## 2020-01-26 DIAGNOSIS — G35 Multiple sclerosis: Secondary | ICD-10-CM | POA: Diagnosis not present

## 2020-02-08 ENCOUNTER — Other Ambulatory Visit: Payer: Self-pay

## 2020-02-08 ENCOUNTER — Ambulatory Visit (HOSPITAL_COMMUNITY)
Admission: RE | Admit: 2020-02-08 | Discharge: 2020-02-08 | Disposition: A | Discharge status: Home / Self Care | Payer: Medicare HMO | Source: Ambulatory Visit | Attending: Neurology | Admitting: Neurology

## 2020-02-08 DIAGNOSIS — G35 Multiple sclerosis: Secondary | ICD-10-CM | POA: Diagnosis not present

## 2020-02-27 DIAGNOSIS — G609 Hereditary and idiopathic neuropathy, unspecified: Secondary | ICD-10-CM | POA: Diagnosis not present

## 2020-02-27 DIAGNOSIS — G35 Multiple sclerosis: Secondary | ICD-10-CM | POA: Diagnosis not present

## 2020-02-27 DIAGNOSIS — F419 Anxiety disorder, unspecified: Secondary | ICD-10-CM | POA: Diagnosis not present

## 2020-02-27 DIAGNOSIS — F339 Major depressive disorder, recurrent, unspecified: Secondary | ICD-10-CM | POA: Diagnosis not present

## 2020-03-26 DIAGNOSIS — R69 Illness, unspecified: Secondary | ICD-10-CM | POA: Diagnosis not present

## 2020-03-26 DIAGNOSIS — G609 Hereditary and idiopathic neuropathy, unspecified: Secondary | ICD-10-CM | POA: Diagnosis not present

## 2020-03-26 DIAGNOSIS — G35 Multiple sclerosis: Secondary | ICD-10-CM | POA: Diagnosis not present

## 2020-05-08 ENCOUNTER — Other Ambulatory Visit: Payer: Self-pay | Admitting: Internal Medicine

## 2020-05-08 DIAGNOSIS — Z1231 Encounter for screening mammogram for malignant neoplasm of breast: Secondary | ICD-10-CM

## 2020-06-21 ENCOUNTER — Ambulatory Visit
Admission: EM | Admit: 2020-06-21 | Discharge: 2020-06-21 | Disposition: A | Discharge status: Home / Self Care | Payer: Medicare HMO | Attending: Emergency Medicine | Admitting: Emergency Medicine

## 2020-06-21 ENCOUNTER — Other Ambulatory Visit: Payer: Self-pay

## 2020-06-21 DIAGNOSIS — B029 Zoster without complications: Secondary | ICD-10-CM | POA: Diagnosis not present

## 2020-06-21 MED ORDER — PREDNISONE 10 MG PO TABS: 20.0000 mg | ORAL_TABLET | Freq: Every day | ORAL | 0 refills | Status: DC

## 2020-06-21 MED ORDER — VALACYCLOVIR HCL 1 G PO TABS: 1000.0000 mg | ORAL_TABLET | Freq: Three times a day (TID) | ORAL | 0 refills | Status: DC

## 2020-06-21 NOTE — Discharge Instructions (Addendum)
Rest and use ice/heat as needed for symptomatic relief Prescribed valacyclovir 1000mg  3x/day for 7 days Prescribed prednisone taper for inflammation and pain Use OTC medications such as ibuprofen/ tylenol.  Use tramadol as needed for break-through pain Follow up with PCP in 7-10 days if rash is still present Follow up with PCP if symptoms of burning, stinging, tingling or numbness occur after rash resolves, you may need additional treatment Return here or go to ER if you have any new or worsening symptoms (such as eye involvement, severe pain, or signs of secondary infection such as fever, chills, nausea, vomiting, discharge, redness or warmth over site of rash)

## 2020-06-21 NOTE — ED Triage Notes (Signed)
Pt presents with rash on left breast and left back, believed to be shingles

## 2020-06-21 NOTE — ED Provider Notes (Signed)
[place]   [id number] 06/21/20 Arrival Time: 38   Chief Complaint  Patient presents with  . Rash     SUBJECTIVE: History from: patient.  Erin Berg is a 54 y.o. female who presented to the urgent care with a complaint of rash to her left breast and back for the past 3 days.  She denies changes in soaps, detergents, or anyone with similar symptoms.  She localizes the rash to her left breast/back.  She describes it as painful, red and spreading.  She has no tried any OTC medication.  Denies aggravating factors.denies similar symptoms in the past.  Denies chills, fever, nausea, vomiting, diarrhea.   ROS: As per HPI.  All other pertinent ROS negative.       Past Medical History:  Diagnosis Date  . Anxiety and depression   . BMI 24.0-24.9, adult   . MS (multiple sclerosis) ([place])   . Multiple sclerosis ([place])    Past Surgical History:  Procedure Laterality Date  . TONSILLECTOMY AND ADENOIDECTOMY     Allergies  Allergen Reactions  . Sulfamethoxazole-Trimethoprim     REACTION: Hives   No current facility-administered medications on file prior to encounter.   Current Outpatient Medications on File Prior to Encounter  Medication Sig Dispense Refill  . ALPRAZolam (XANAX) 0.5 MG tablet Take 0.5 mg by mouth 3 (three) times daily as needed for anxiety.    . AMPYRA 10 MG TB12     . buPROPion (WELLBUTRIN) 100 MG tablet Take 30 mg by mouth daily.    . cholecalciferol (VITAMIN D) 1000 UNITS tablet Take 5,000 Units by mouth daily.    . ciprofloxacin (CIPRO) 500 MG tablet Take 500 mg by mouth 2 (two) times daily.    . ergocalciferol (VITAMIN D2) 1.25 MG (50000 UT) capsule Take 50,000 Units by mouth once a week.    Marland Kitchen FLUoxetine (PROZAC) 40 MG capsule Take 20 mg by mouth daily.     . mirtazapine (REMERON) 30 MG tablet Take 30 mg by mouth at bedtime.    . Omega-3 Fatty Acids (FISH OIL PO) Take by mouth 2 (two) times daily.    Marland Kitchen oxybutynin (DITROPAN) 5 MG tablet Take  10 mg by mouth daily.    . polyethylene glycol-electrolytes (TRILYTE) 420 g solution Take 4,000 mLs by mouth as directed. 4000 mL 0  . sertraline (ZOLOFT) 50 MG tablet Take 50 mg by mouth daily.    . TECFIDERA 240 MG CPDR 1 capsule 2 (two) times daily.     Marland Kitchen terbinafine (LAMISIL) 250 MG tablet      Social History   Socioeconomic History  . Marital status: Unknown    Spouse name: Not on file  . Number of children: Not on file  . Years of education: Not on file  . Highest education level: Not on file  Occupational History  . Not on file  Tobacco Use  . Smoking status: Former Smoker    Types: Cigarettes  . Smokeless tobacco: Never Used  Substance and Sexual Activity  . Alcohol use: No    Alcohol/week: 0.0 standard drinks  . Drug use: No  . Sexual activity: Not on file  Other Topics Concern  . Not on file  Social History Narrative  . Not on file   Social Determinants of Health   Financial Resource Strain:   . Difficulty of Paying Living Expenses: Not on file  Food Insecurity:   . Worried About Charity fundraiser in the Last  Year: Not on file  . Ran Out of Food in the Last Year: Not on file  Transportation Needs:   . Lack of Transportation (Medical): Not on file  . Lack of Transportation (Non-Medical): Not on file  Physical Activity:   . Days of Exercise per Week: Not on file  . Minutes of Exercise per Session: Not on file  Stress:   . Feeling of Stress : Not on file  Social Connections:   . Frequency of Communication with Friends and Family: Not on file  . Frequency of Social Gatherings with Friends and Family: Not on file  . Attends Religious Services: Not on file  . Active Member of Clubs or Organizations: Not on file  . Attends Archivist Meetings: Not on file  . Marital Status: Not on file  Intimate Partner Violence:   . Fear of Current or Ex-Partner: Not on file  . Emotionally Abused: Not on file  . Physically Abused: Not on file  . Sexually Abused:  Not on file   Family History  Problem Relation Age of Onset  . Colon cancer Maternal Grandmother     OBJECTIVE:  Vitals:   06/21/20 1050  BP: (!) 157/78  Pulse: (!) 106  Resp: 20  Temp: 98.7 F (37.1 C)  SpO2: 95%     Physical Exam Vitals and nursing note reviewed.  Constitutional:      General: She is not in acute distress.    Appearance: Normal appearance. She is normal weight. She is not ill-appearing, toxic-appearing or diaphoretic.  HENT:     Head: Normocephalic.  Cardiovascular:     Rate and Rhythm: Normal rate and regular rhythm.     Pulses: Normal pulses.     Heart sounds: Normal heart sounds. No murmur heard.  No friction rub. No gallop.   Pulmonary:     Effort: Pulmonary effort is normal. No respiratory distress.     Breath sounds: Normal breath sounds. No stridor. No wheezing, rhonchi or rales.  Chest:     Chest wall: No tenderness.  Skin:    General: Skin is warm.     Findings: Rash present.     Comments: Crops of red/purplish papules over left face and left back; no crusting  Neurological:     Mental Status: She is alert and oriented to person, place, and time.    LABS:  No results found for this or any previous visit (from the past 24 hour(s)).   ASSESSMENT & PLAN:  1. Herpes zoster without complication     Meds ordered this encounter  Medications  . valACYclovir (VALTREX) 1000 MG tablet    Sig: Take 1 tablet (1,000 mg total) by mouth 3 (three) times daily.    Dispense:  21 tablet    Refill:  0  . predniSONE (DELTASONE) 10 MG tablet    Sig: Take 2 tablets (20 mg total) by mouth daily.    Dispense:  15 tablet    Refill:  0    Discharge instructions  Rest and use ice/heat as needed for symptomatic relief Prescribed valacyclovir 1000mg  3x/day for 7 days Prescribed prednisone taper for inflammation and pain Use OTC medications such as ibuprofen/ tylenol.  Use tramadol as needed for break-through pain Follow up with PCP in 7-10 days if  rash is still present Follow up with PCP if symptoms of burning, stinging, tingling or numbness occur after rash resolves, you may need additional treatment Return here or go to ER if you  have any new or worsening symptoms (such as eye involvement, severe pain, or signs of secondary infection such as fever, chills, nausea, vomiting, discharge, redness or warmth over site of rash)    Reviewed expectations re: course of current medical issues. Questions answered. Outlined signs and symptoms indicating need for more acute intervention. Patient verbalized understanding. After Visit Summary given.         Emerson Monte, FNP 06/21/20 1102

## 2020-08-02 ENCOUNTER — Other Ambulatory Visit: Payer: Self-pay

## 2020-08-02 ENCOUNTER — Ambulatory Visit
Admission: RE | Admit: 2020-08-02 | Discharge: 2020-08-02 | Disposition: A | Discharge status: Home / Self Care | Payer: Medicare HMO | Source: Ambulatory Visit | Attending: Internal Medicine | Admitting: Internal Medicine

## 2020-08-02 DIAGNOSIS — Z1231 Encounter for screening mammogram for malignant neoplasm of breast: Secondary | ICD-10-CM

## 2020-09-10 DIAGNOSIS — R69 Illness, unspecified: Secondary | ICD-10-CM | POA: Diagnosis not present

## 2020-09-10 DIAGNOSIS — G35 Multiple sclerosis: Secondary | ICD-10-CM | POA: Diagnosis not present

## 2020-09-10 DIAGNOSIS — G609 Hereditary and idiopathic neuropathy, unspecified: Secondary | ICD-10-CM | POA: Diagnosis not present

## 2020-10-10 DIAGNOSIS — D1730 Benign lipomatous neoplasm of skin and subcutaneous tissue of unspecified sites: Secondary | ICD-10-CM | POA: Diagnosis not present

## 2020-10-10 DIAGNOSIS — F330 Major depressive disorder, recurrent, mild: Secondary | ICD-10-CM | POA: Diagnosis not present

## 2020-10-10 DIAGNOSIS — N3000 Acute cystitis without hematuria: Secondary | ICD-10-CM | POA: Diagnosis not present

## 2020-10-10 DIAGNOSIS — G35 Multiple sclerosis: Secondary | ICD-10-CM | POA: Diagnosis not present

## 2020-10-10 DIAGNOSIS — Z681 Body mass index (BMI) 19 or less, adult: Secondary | ICD-10-CM | POA: Diagnosis not present

## 2020-10-10 DIAGNOSIS — Z0001 Encounter for general adult medical examination with abnormal findings: Secondary | ICD-10-CM | POA: Diagnosis not present

## 2020-10-10 DIAGNOSIS — E785 Hyperlipidemia, unspecified: Secondary | ICD-10-CM | POA: Diagnosis not present

## 2020-10-10 DIAGNOSIS — R69 Illness, unspecified: Secondary | ICD-10-CM | POA: Diagnosis not present

## 2020-10-10 DIAGNOSIS — Z1389 Encounter for screening for other disorder: Secondary | ICD-10-CM | POA: Diagnosis not present

## 2020-10-12 DIAGNOSIS — R69 Illness, unspecified: Secondary | ICD-10-CM | POA: Diagnosis not present

## 2020-10-12 DIAGNOSIS — R030 Elevated blood-pressure reading, without diagnosis of hypertension: Secondary | ICD-10-CM | POA: Diagnosis not present

## 2020-10-12 DIAGNOSIS — E785 Hyperlipidemia, unspecified: Secondary | ICD-10-CM | POA: Diagnosis not present

## 2020-10-12 DIAGNOSIS — G35 Multiple sclerosis: Secondary | ICD-10-CM | POA: Diagnosis not present

## 2020-10-12 DIAGNOSIS — N390 Urinary tract infection, site not specified: Secondary | ICD-10-CM | POA: Diagnosis not present

## 2020-10-12 DIAGNOSIS — R32 Unspecified urinary incontinence: Secondary | ICD-10-CM | POA: Diagnosis not present

## 2020-10-12 DIAGNOSIS — Z7722 Contact with and (suspected) exposure to environmental tobacco smoke (acute) (chronic): Secondary | ICD-10-CM | POA: Diagnosis not present

## 2020-10-12 DIAGNOSIS — Z87891 Personal history of nicotine dependence: Secondary | ICD-10-CM | POA: Diagnosis not present

## 2020-10-12 DIAGNOSIS — Z993 Dependence on wheelchair: Secondary | ICD-10-CM | POA: Diagnosis not present

## 2020-11-06 DIAGNOSIS — R222 Localized swelling, mass and lump, trunk: Secondary | ICD-10-CM | POA: Diagnosis not present

## 2020-12-24 DIAGNOSIS — H5210 Myopia, unspecified eye: Secondary | ICD-10-CM | POA: Diagnosis not present

## 2020-12-24 DIAGNOSIS — Z0100 Encounter for examination of eyes and vision without abnormal findings: Secondary | ICD-10-CM | POA: Diagnosis not present

## 2021-02-25 DIAGNOSIS — G35 Multiple sclerosis: Secondary | ICD-10-CM | POA: Diagnosis not present

## 2021-02-25 DIAGNOSIS — R69 Illness, unspecified: Secondary | ICD-10-CM | POA: Diagnosis not present

## 2021-02-25 DIAGNOSIS — G609 Hereditary and idiopathic neuropathy, unspecified: Secondary | ICD-10-CM | POA: Diagnosis not present

## 2021-02-25 DIAGNOSIS — R269 Unspecified abnormalities of gait and mobility: Secondary | ICD-10-CM | POA: Diagnosis not present

## 2021-03-13 IMAGING — MG DIGITAL SCREENING BILAT W/ TOMO W/ CAD
8 of 14 series · 9 of 40 positions shown · non-contrast
Comparison: Previous exam(s).

CLINICAL DATA: Screening.

EXAM:
DIGITAL SCREENING BILATERAL MAMMOGRAM WITH TOMO AND CAD

[R MLO synth-2D (1 of 2)]
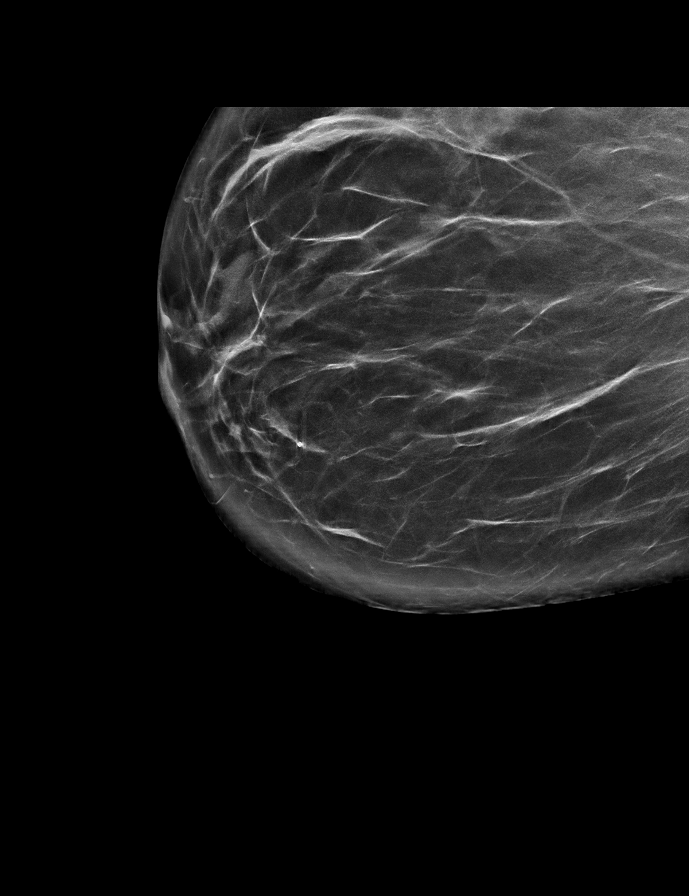

[L MLO synth-2D (1 of 3)]
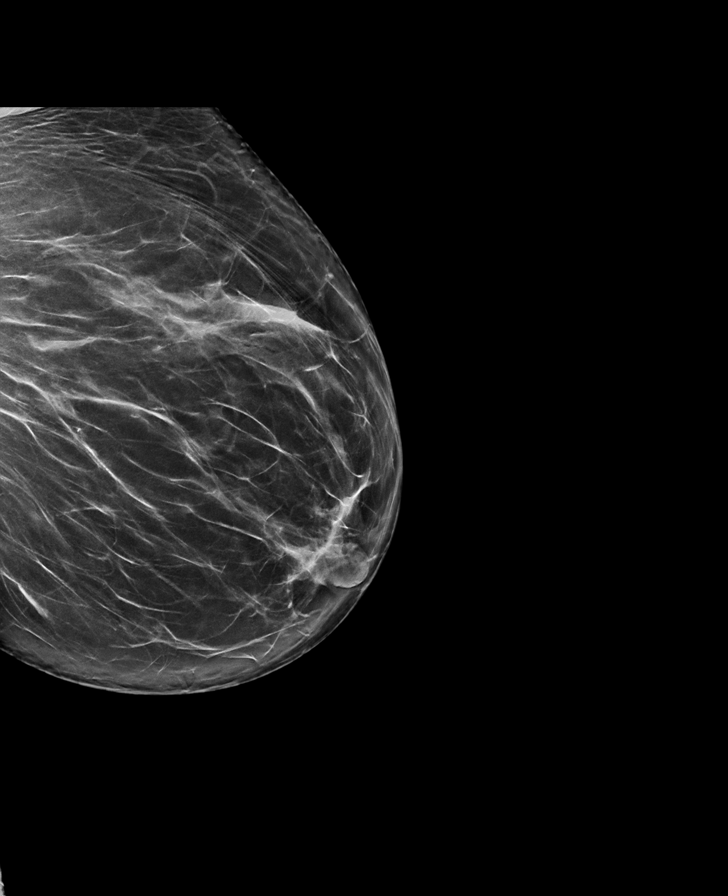

[R CC synth-2D]
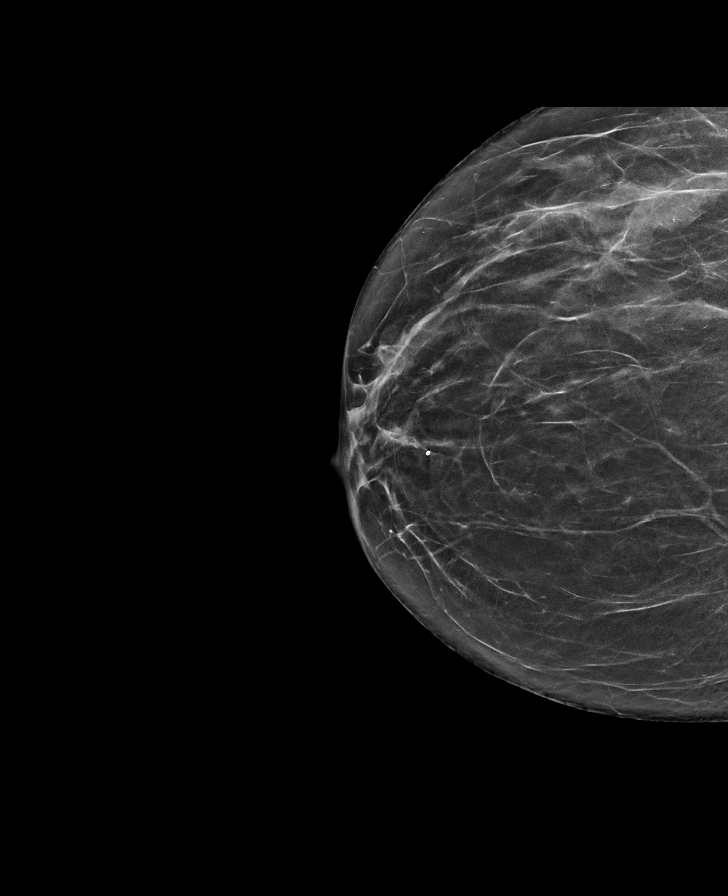

[L MLO synth-2D (2 of 3)]
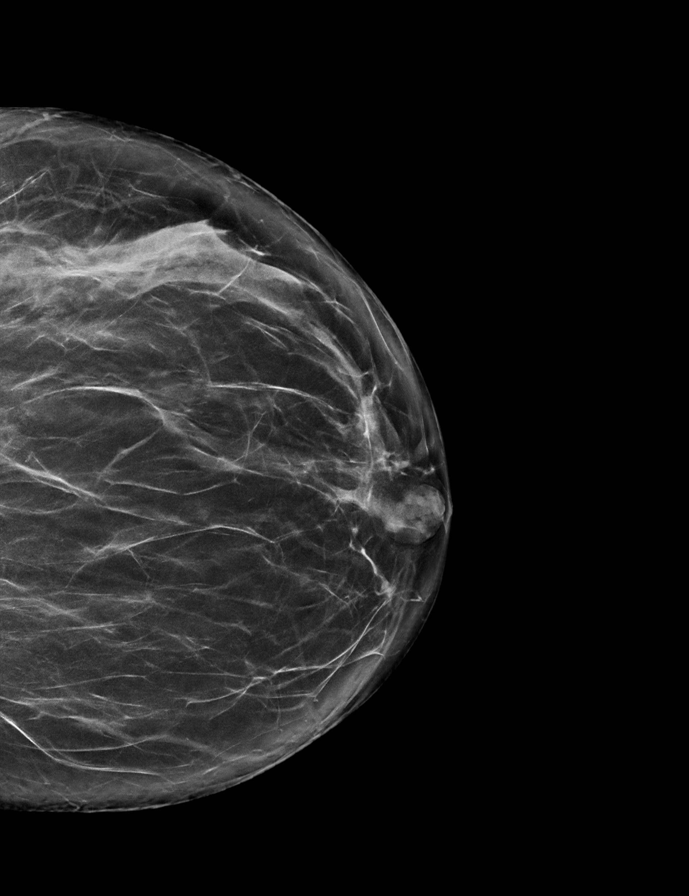

[R MLO synth-2D (2 of 2)]
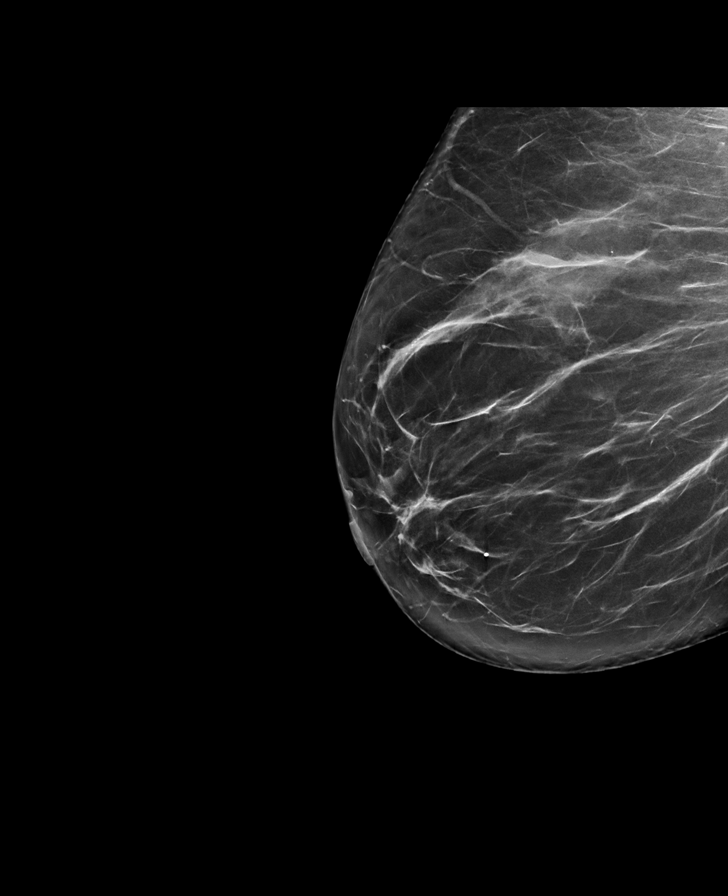

[L CC synth-2D]
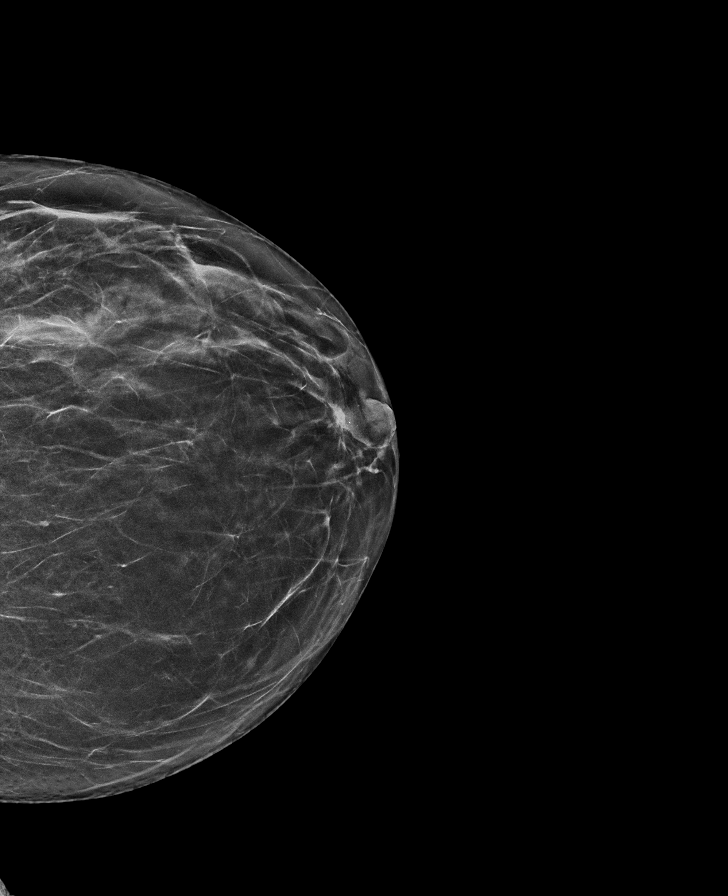

[L MLO synth-2D (3 of 3)]
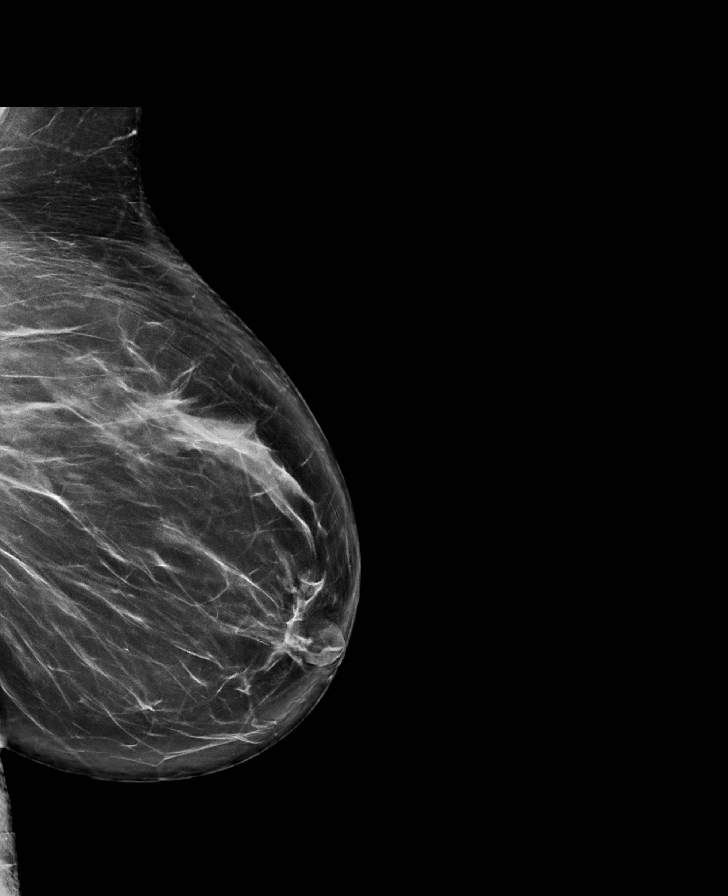

[R CC tomo · 2 of 63 frames shown]
[frame 21/63]
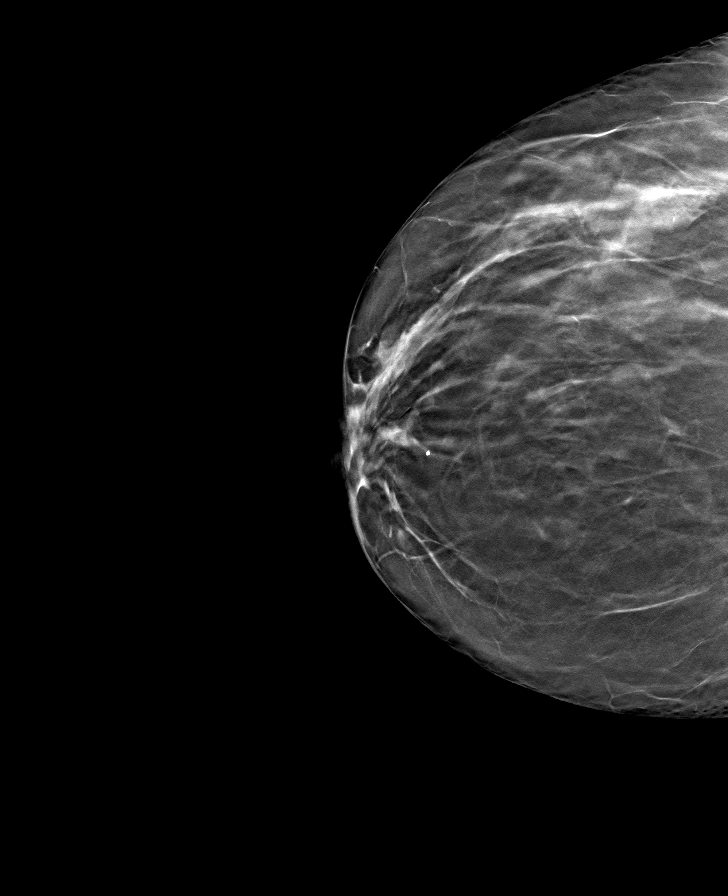
[frame 32/63]
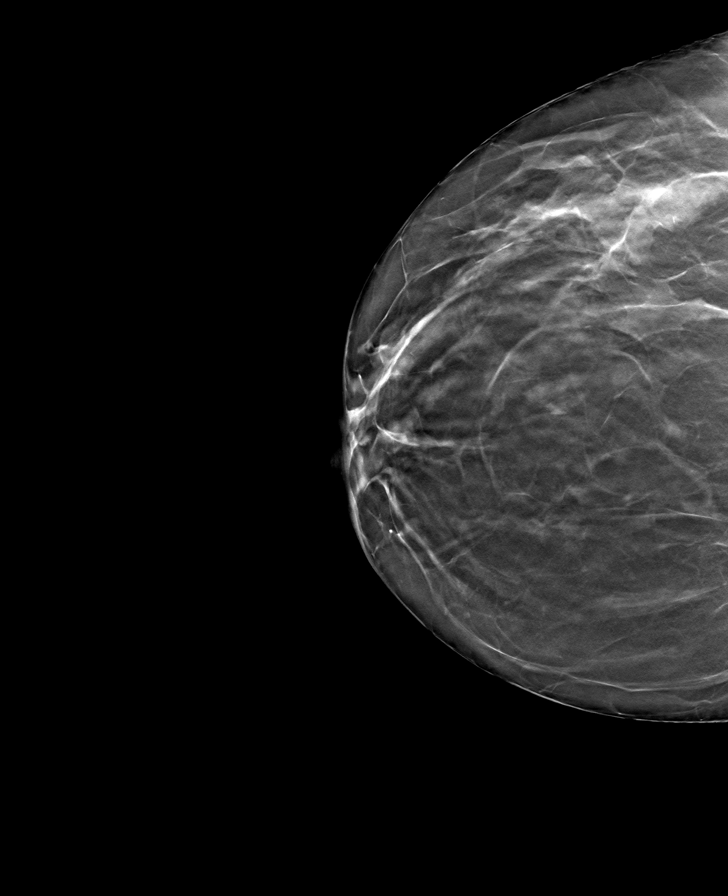

[9 of 40 positions shown; findings below may reference images not displayed]

ACR Breast Density Category b: There are scattered areas of
fibroglandular density.
FINDINGS: There are no findings suspicious for malignancy. Images were
processed with CAD.
IMPRESSION: No mammographic evidence of malignancy. A result letter of this
screening mammogram will be mailed directly to the patient.

RECOMMENDATION:
Screening mammogram in one year. (Code:[id number])

BI-RADS CATEGORY  1: Negative.

## 2021-06-02 DIAGNOSIS — E785 Hyperlipidemia, unspecified: Secondary | ICD-10-CM | POA: Diagnosis not present

## 2021-06-02 DIAGNOSIS — R7309 Other abnormal glucose: Secondary | ICD-10-CM | POA: Diagnosis not present

## 2021-06-02 DIAGNOSIS — J302 Other seasonal allergic rhinitis: Secondary | ICD-10-CM | POA: Diagnosis not present

## 2021-06-02 DIAGNOSIS — Z124 Encounter for screening for malignant neoplasm of cervix: Secondary | ICD-10-CM | POA: Diagnosis not present

## 2021-06-02 DIAGNOSIS — R69 Illness, unspecified: Secondary | ICD-10-CM | POA: Diagnosis not present

## 2021-06-23 ENCOUNTER — Other Ambulatory Visit: Payer: Self-pay | Admitting: Internal Medicine

## 2021-06-23 DIAGNOSIS — Z1231 Encounter for screening mammogram for malignant neoplasm of breast: Secondary | ICD-10-CM

## 2021-07-03 ENCOUNTER — Ambulatory Visit (INDEPENDENT_AMBULATORY_CARE_PROVIDER_SITE_OTHER): Payer: Medicare HMO

## 2021-07-03 ENCOUNTER — Encounter: Payer: Self-pay | Admitting: Emergency Medicine

## 2021-07-03 ENCOUNTER — Ambulatory Visit
Admission: EM | Admit: 2021-07-03 | Discharge: 2021-07-03 | Disposition: A | Discharge status: Home / Self Care | Payer: Medicare HMO | Attending: Emergency Medicine | Admitting: Emergency Medicine

## 2021-07-03 ENCOUNTER — Other Ambulatory Visit: Payer: Self-pay

## 2021-07-03 DIAGNOSIS — M7989 Other specified soft tissue disorders: Secondary | ICD-10-CM | POA: Diagnosis not present

## 2021-07-03 DIAGNOSIS — S82832A Other fracture of upper and lower end of left fibula, initial encounter for closed fracture: Secondary | ICD-10-CM

## 2021-07-03 DIAGNOSIS — S82839A Other fracture of upper and lower end of unspecified fibula, initial encounter for closed fracture: Secondary | ICD-10-CM

## 2021-07-03 MED ORDER — IBUPROFEN 600 MG PO TABS: 600.0000 mg | ORAL_TABLET | Freq: Four times a day (QID) | ORAL | 0 refills | Status: DC | PRN

## 2021-07-03 NOTE — Discharge Instructions (Addendum)
Take 600 mg of ibuprofen with 1000 mg of Tylenol together 3-4 times a day as needed for pain.  Wear the splint for the next several weeks, then as needed.  You may transition to an ASO ankle stirrup/boot brace if you are starting to feel better and the splint becomes too bulky.  Please follow-up with orthopedics in a month if you are not getting any better.

## 2021-07-03 NOTE — ED Triage Notes (Signed)
PT fell out of bed and injured left ankle last night.

## 2021-07-03 NOTE — ED Provider Notes (Signed)
HPI  SUBJECTIVE:  Erin Berg is a 55 y.o. female who presents with left ankle pain starting last night after she fell out of bed.  She does not remember exactly how she fell onto her foot.  She was unable to bear weight immediately after and has not been able to bear weight on it since.  She reports lateral soft tissue swelling, bruising and limitation of motion at the ankle.  No numbness or tingling.  No injury to the knee or foot.  She has tried ice and 1500 mg of Tylenol.  The Tylenol helps.  Symptoms are worse with moving it.  She has a past medical history of MS and is wheelchair-bound.  No history of left ankle injury.  Past Medical History:  Diagnosis Date   Anxiety and depression    BMI 24.0-24.9, adult    MS (multiple sclerosis) ([place])    Multiple sclerosis ([place])     Past Surgical History:  Procedure Laterality Date   TONSILLECTOMY AND ADENOIDECTOMY      Family History  Problem Relation Age of Onset   Breast cancer Mother    Colon cancer Maternal Grandmother    Breast cancer Paternal Grandmother     Social History   Tobacco Use   Smoking status: Former    Types: Cigarettes   Smokeless tobacco: Never  Substance Use Topics   Alcohol use: No    Alcohol/week: 0.0 standard drinks   Drug use: No    No current facility-administered medications for this encounter.  Current Outpatient Medications:    ibuprofen (ADVIL) 600 MG tablet, Take 1 tablet (600 mg total) by mouth every 6 (six) hours as needed., Disp: 30 tablet, Rfl: 0   ALPRAZolam (XANAX) 0.5 MG tablet, Take 0.5 mg by mouth 3 (three) times daily as needed for anxiety., Disp: , Rfl:    AMPYRA 10 MG TB12, , Disp: , Rfl:    buPROPion (WELLBUTRIN) 100 MG tablet, Take 30 mg by mouth daily., Disp: , Rfl:    cholecalciferol (VITAMIN D) 1000 UNITS tablet, Take 5,000 Units by mouth daily., Disp: , Rfl:    ergocalciferol (VITAMIN D2) 1.25 MG (50000 UT) capsule, Take 50,000 Units by mouth once a week., Disp: , Rfl:     FLUoxetine (PROZAC) 40 MG capsule, Take 20 mg by mouth daily. , Disp: , Rfl:    mirtazapine (REMERON) 30 MG tablet, Take 30 mg by mouth at bedtime., Disp: , Rfl:    Omega-3 Fatty Acids (FISH OIL PO), Take by mouth 2 (two) times daily., Disp: , Rfl:    oxybutynin (DITROPAN) 5 MG tablet, Take 10 mg by mouth daily., Disp: , Rfl:    polyethylene glycol-electrolytes (TRILYTE) 420 g solution, Take 4,000 mLs by mouth as directed., Disp: 4000 mL, Rfl: 0   predniSONE (DELTASONE) 10 MG tablet, Take 2 tablets (20 mg total) by mouth daily., Disp: 15 tablet, Rfl: 0   sertraline (ZOLOFT) 50 MG tablet, Take 50 mg by mouth daily., Disp: , Rfl:    TECFIDERA 240 MG CPDR, 1 capsule 2 (two) times daily. , Disp: , Rfl:    terbinafine (LAMISIL) 250 MG tablet, , Disp: , Rfl:    valACYclovir (VALTREX) 1000 MG tablet, Take 1 tablet (1,000 mg total) by mouth 3 (three) times daily., Disp: 21 tablet, Rfl: 0  Allergies  Allergen Reactions   Sulfamethoxazole-Trimethoprim     REACTION: Hives     ROS  As noted in HPI.   Physical Exam  BP 116/68  Pulse 81   Temp 98.2 F (36.8 C) (Oral)   Resp 16   LMP 08/01/2019 (Exact Date)   SpO2 98%   Constitutional: Well developed, well nourished, no acute distress Eyes:  EOMI, conjunctiva normal bilaterally HENT: Normocephalic, atraumatic,mucus membranes moist Respiratory: Normal inspiratory effort Cardiovascular: Normal rate GI: nondistended skin: No rash, skin intact Musculoskeletal: Left ankle.  Lateral soft tissue swelling, bruising.  Tenderness over the ATFL, distal fibula.  Pain with inversion/eversion.  Unable to bear weight.  Proximal fibula NT, Medial malleolus NT  Deltoid ligaments medially NT,   calcaneofibular ligament NT, posterior tablofibular ligament NT ,  Achilles NT, calcaneus  NT,  Proximal 5th metatarsal NT, Midfoot NT, distal NVI with baseline sensation / motor to foot with DP 2+. no pain with dorsiflexion/plantar flexion.  Negative squeeze test .   Ant drawer test stable. Pt not able to bear weight in dept.   Neurologic: Alert & oriented x 3, no focal neuro deficits Psychiatric: Speech and behavior appropriate   ED Course   Medications - No data to display  Orders Placed This Encounter  Procedures   DG Ankle Complete Left    Standing Status:   Standing    Number of Occurrences:   1    Order Specific Question:   Reason for Exam (SYMPTOM  OR DIAGNOSIS REQUIRED)    Answer:   injury    Order Specific Question:   Is patient pregnant?    Answer:   No    Order Specific Question:   Release to patient    Answer:   Immediate   Apply splint short leg    Stirrup/posterior    Standing Status:   Standing    Number of Occurrences:   1    Order Specific Question:   Laterality    Answer:   Left   Crutches    Standing Status:   Standing    Number of Occurrences:   1    No results found for this or any previous visit (from the past 24 hour(s)). DG Ankle Complete Left  Result Date: 07/03/2021 CLINICAL DATA:  Injury EXAM: LEFT ANKLE COMPLETE - 3+ VIEW COMPARISON:  None. FINDINGS: Small osseous fragment adjacent to the distal fibula. Otherwise, no evidence of fracture. Ankle mortise is well maintained on nonweightbearing views. Soft tissue swelling about the ankle, most pronounced at the lateral malleolus. IMPRESSION: Small osseous fragment adjacent to the distal fibula, concerning for small avulsion fracture. Correlate for point tenderness. Electronically Signed   By: Yetta Glassman M.D.   On: 07/03/2021 13:02    ED Clinical Impression  1. Avulsion fracture of distal end of fibula      ED Assessment/Plan  Checking x-ray as patient was unable to bear weight immediately after and has not been able to bear weight on it since  Reviewed imaging independently. Small osseous fragment adjacent to the distal fibula concerning for small avulsion fracture.   See radiology report for full details.  Patient with an avulsion fracture of the  distal fibula. this is where patient is most tender.  She is otherwise neurovascularly intact   Will place in posterior short leg and stirrup splint for patient comfort.  Patient is in a wheelchair due [place].  Tylenol/ibuprofen, ice, elevate.  May transition to ASO in 2 to 3 weeks if feeling better.  Follow-up with orthopedics if not feeling better in a month.   Discussedimaging, MDM, treatment plan, and plan for follow-up with patient. patient agrees  with plan.   Meds ordered this encounter  Medications   ibuprofen (ADVIL) 600 MG tablet    Sig: Take 1 tablet (600 mg total) by mouth every 6 (six) hours as needed.    Dispense:  30 tablet    Refill:  0       *This clinic note was created using [employer]. Therefore, there may be occasional mistakes despite careful proofreading.  ?    Melynda Ripple, MD 07/04/21 [phone]

## 2021-07-25 DIAGNOSIS — G35 Multiple sclerosis: Secondary | ICD-10-CM | POA: Diagnosis not present

## 2021-08-04 ENCOUNTER — Ambulatory Visit
Admission: RE | Admit: 2021-08-04 | Discharge: 2021-08-04 | Disposition: A | Discharge status: Home / Self Care | Payer: Medicare HMO | Source: Ambulatory Visit | Attending: Internal Medicine | Admitting: Internal Medicine

## 2021-08-04 DIAGNOSIS — Z1231 Encounter for screening mammogram for malignant neoplasm of breast: Secondary | ICD-10-CM

## 2021-08-20 DIAGNOSIS — G35 Multiple sclerosis: Secondary | ICD-10-CM | POA: Diagnosis not present

## 2021-08-20 DIAGNOSIS — G609 Hereditary and idiopathic neuropathy, unspecified: Secondary | ICD-10-CM | POA: Diagnosis not present

## 2021-08-20 DIAGNOSIS — R69 Illness, unspecified: Secondary | ICD-10-CM | POA: Diagnosis not present

## 2021-08-20 DIAGNOSIS — R269 Unspecified abnormalities of gait and mobility: Secondary | ICD-10-CM | POA: Diagnosis not present

## 2021-09-02 DIAGNOSIS — Z79899 Other long term (current) drug therapy: Secondary | ICD-10-CM | POA: Diagnosis not present

## 2021-09-02 DIAGNOSIS — G35 Multiple sclerosis: Secondary | ICD-10-CM | POA: Diagnosis not present

## 2021-09-02 DIAGNOSIS — G119 Hereditary ataxia, unspecified: Secondary | ICD-10-CM | POA: Diagnosis not present

## 2021-09-02 DIAGNOSIS — G609 Hereditary and idiopathic neuropathy, unspecified: Secondary | ICD-10-CM | POA: Diagnosis not present

## 2021-09-02 DIAGNOSIS — G43909 Migraine, unspecified, not intractable, without status migrainosus: Secondary | ICD-10-CM | POA: Diagnosis not present

## 2021-09-02 DIAGNOSIS — R269 Unspecified abnormalities of gait and mobility: Secondary | ICD-10-CM | POA: Diagnosis not present

## 2021-09-02 DIAGNOSIS — R69 Illness, unspecified: Secondary | ICD-10-CM | POA: Diagnosis not present

## 2021-09-02 DIAGNOSIS — Z9181 History of falling: Secondary | ICD-10-CM | POA: Diagnosis not present

## 2021-09-15 ENCOUNTER — Encounter: Payer: Self-pay | Admitting: Internal Medicine

## 2021-09-17 DIAGNOSIS — G35 Multiple sclerosis: Secondary | ICD-10-CM | POA: Diagnosis not present

## 2021-10-23 DIAGNOSIS — G35 Multiple sclerosis: Secondary | ICD-10-CM | POA: Diagnosis not present

## 2021-11-12 ENCOUNTER — Ambulatory Visit (INDEPENDENT_AMBULATORY_CARE_PROVIDER_SITE_OTHER): Payer: Self-pay | Admitting: *Deleted

## 2021-11-12 ENCOUNTER — Other Ambulatory Visit: Payer: Self-pay

## 2021-11-12 VITALS — Ht 65.0 in | Wt 190.0 lb

## 2021-11-12 DIAGNOSIS — Z1211 Encounter for screening for malignant neoplasm of colon: Secondary | ICD-10-CM

## 2021-11-12 NOTE — Progress Notes (Addendum)
Gastroenterology Pre-Procedure Review ? ?Request Date: 11/12/2021 ?Requesting Physician: Dr. Redmond School, no previous TCS, family hx of colon cancer (grandmother) ? ?PATIENT REVIEW QUESTIONS: The patient responded to the following health history questions as indicated:   ? ?1. Diabetes Melitis: no ?2. Joint replacements in the past 12 months: no ?3. Major health problems in the past 3 months: no ?4. Has an artificial valve or MVP: no ?5. Has a defibrillator: no ?6. Has been advised in past to take antibiotics in advance of a procedure like teeth cleaning: no ?7. Family history of colon cancer: yes, grandmother: age unknown  ?8. Alcohol Use: no ?9. Illicit drug Use: no ?10. History of sleep apnea: no  ?11. History of coronary artery or other vascular stents placed within the last 12 months: no ?12. History of any prior anesthesia complications: no ?13. Body mass index is 31.62 kg/m?. ?   ?MEDICATIONS & ALLERGIES:    ?Patient reports the following regarding taking any blood thinners:   ?Plavix? no ?Aspirin? no ?Coumadin? no ?Brilinta? no ?Xarelto? no ?Eliquis? no ?Pradaxa? no ?Savaysa? no ?Effient? no ? ?Patient confirms/reports the following medications:  ?Current Outpatient Medications  ?Medication Sig Dispense Refill  ? acetaminophen (TYLENOL) 500 MG tablet Take 500 mg by mouth every 6 (six) hours as needed.    ? amoxicillin (AMOXIL) 500 MG capsule Take 500 mg by mouth 4 (four) times daily.    ? FLUoxetine (PROZAC) 40 MG capsule Take 20 mg by mouth daily.     ? gabapentin (NEURONTIN) 300 MG capsule Take 300 mg by mouth 2 (two) times daily.    ? ibuprofen (ADVIL) 800 MG tablet Take 800 mg by mouth as needed.    ? rosuvastatin (CRESTOR) 5 MG tablet Take 5 mg by mouth at bedtime.    ? TECFIDERA 240 MG CPDR 1 capsule 2 (two) times daily.     ? ?No current facility-administered medications for this visit.  ? ? ?Patient confirms/reports the following allergies:  ?Allergies  ?Allergen Reactions  ?  Sulfamethoxazole-Trimethoprim   ?  REACTION: Hives  ? ? ?No orders of the defined types were placed in this encounter. ? ? ?AUTHORIZATION INFORMATION ?Primary Insurance: [place],  [place] #: [id number],  Group #: [id number] [place] ?Pre-Cert / Josem Kaufmann required: No, not required ? ?Secondary Insurance: Medicaid [place] Access,  [place] #: [id number] ?Pre-Cert / Josem Kaufmann required: No, not required ? ?SCHEDULE INFORMATION: ?Procedure has been scheduled as follows:  ?Date: 12/22/2021, Time: 12:30 ?Location: [facility] with Dr. Gala Romney ? ?This Gastroenterology Pre-Precedure Review Form is being routed to the following provider(s): Neil Crouch, PA-C ?  ?

## 2021-11-22 NOTE — Progress Notes (Signed)
Ok to schedule. ASA 2.  

## 2021-11-23 DIAGNOSIS — G35 Multiple sclerosis: Secondary | ICD-10-CM | POA: Diagnosis not present

## 2021-11-24 ENCOUNTER — Other Ambulatory Visit: Payer: Self-pay | Admitting: *Deleted

## 2021-11-24 ENCOUNTER — Encounter: Payer: Self-pay | Admitting: *Deleted

## 2021-11-24 MED ORDER — NA SULFATE-K SULFATE-MG SULF 17.5-3.13-1.6 GM/177ML PO SOLN: 1.0000 | Freq: Once | ORAL | 0 refills | Status: AC

## 2021-11-24 NOTE — Progress Notes (Signed)
Spoke to pt.  Scheduled procedure for 12/22/2021 at 12:30, arrival 11:00 at [facility].  Reviewed prep instructions with pt by phone.  Pt aware that I sent RX to her pharmacy.  She is aware that I am mailing out instructions.  Confirmed mailing address. ?

## 2021-11-24 NOTE — Addendum Note (Signed)
Addended by: Metro Kung on: 11/24/2021 11:31 AM ? ? Modules accepted: Orders ? ?

## 2021-12-22 ENCOUNTER — Encounter (HOSPITAL_COMMUNITY)
Admission: RE | Disposition: A | Discharge status: Home / Self Care | Payer: Self-pay | Source: Home / Self Care | Attending: Internal Medicine

## 2021-12-22 ENCOUNTER — Ambulatory Visit (HOSPITAL_BASED_OUTPATIENT_CLINIC_OR_DEPARTMENT_OTHER): Payer: Medicare HMO | Admitting: Anesthesiology

## 2021-12-22 ENCOUNTER — Ambulatory Visit (HOSPITAL_COMMUNITY)
Admission: RE | Admit: 2021-12-22 | Discharge: 2021-12-22 | Disposition: A | Discharge status: Home / Self Care | Payer: Medicare HMO | Attending: Internal Medicine | Admitting: Internal Medicine

## 2021-12-22 ENCOUNTER — Ambulatory Visit (HOSPITAL_COMMUNITY): Payer: Medicare HMO | Admitting: Anesthesiology

## 2021-12-22 ENCOUNTER — Other Ambulatory Visit: Payer: Self-pay

## 2021-12-22 ENCOUNTER — Encounter (HOSPITAL_COMMUNITY): Payer: Self-pay | Admitting: Internal Medicine

## 2021-12-22 DIAGNOSIS — Z1211 Encounter for screening for malignant neoplasm of colon: Secondary | ICD-10-CM

## 2021-12-22 DIAGNOSIS — Z800 Family history of malignant neoplasm of digestive organs: Secondary | ICD-10-CM | POA: Diagnosis not present

## 2021-12-22 DIAGNOSIS — Z87891 Personal history of nicotine dependence: Secondary | ICD-10-CM | POA: Diagnosis not present

## 2021-12-22 DIAGNOSIS — R69 Illness, unspecified: Secondary | ICD-10-CM | POA: Diagnosis not present

## 2021-12-22 HISTORY — PX: COLONOSCOPY WITH PROPOFOL: SHX5780

## 2021-12-22 SURGERY — COLONOSCOPY WITH PROPOFOL
Anesthesia: General

## 2021-12-22 MED ORDER — LIDOCAINE HCL 1 % IJ SOLN
INTRAMUSCULAR | Status: DC | PRN
  Administered 2021-12-22: 50 mg via INTRADERMAL

## 2021-12-22 MED ORDER — LACTATED RINGERS IV SOLN: INTRAVENOUS | Status: DC | PRN

## 2021-12-22 MED ORDER — PROPOFOL 500 MG/50ML IV EMUL
INTRAVENOUS | Status: DC | PRN
Start: 2021-12-22 — End: 2021-12-22
  Administered 2021-12-22: 150 ug/kg/min via INTRAVENOUS

## 2021-12-22 MED ORDER — LACTATED RINGERS IV SOLN: INTRAVENOUS | Status: DC

## 2021-12-22 MED ORDER — PROPOFOL 10 MG/ML IV BOLUS
INTRAVENOUS | Status: DC | PRN
  Administered 2021-12-22 (×2): 50 mg via INTRAVENOUS

## 2021-12-22 NOTE — Anesthesia Postprocedure Evaluation (Signed)
Anesthesia Post Note ? ?Patient: Erin Berg ? ?Procedure(s) Performed: COLONOSCOPY WITH PROPOFOL ? ?Patient location during evaluation: Endoscopy ?Anesthesia Type: General ?Level of consciousness: awake and alert ?Pain management: pain level controlled ?Vital Signs Assessment: post-procedure vital signs reviewed and stable ?Respiratory status: spontaneous breathing ?Cardiovascular status: blood pressure returned to baseline and stable ?Postop Assessment: no apparent nausea or vomiting ?Anesthetic complications: no ? ? ?No notable events documented. ? ? ?Last Vitals:  ?Vitals:  ? 12/22/21 1052 12/22/21 1133  ?BP: 131/86 122/68  ?Pulse: 88 90  ?Resp: 19 16  ?Temp: 36.7 ?C 36.7 ?C  ?SpO2: 97% 96%  ?  ?Last Pain:  ?Vitals:  ? 12/22/21 1133  ?TempSrc: Oral  ?PainSc: 0-No pain  ? ? ?  ?  ?  ?  ?  ?  ? ?Aliea Bobe ? ? ? ? ?

## 2021-12-22 NOTE — Discharge Instructions (Signed)
?  Colonoscopy ?Discharge Instructions ? ?Read the instructions outlined below and refer to this sheet in the next few weeks. These discharge instructions provide you with general information on caring for yourself after you leave the hospital. Your doctor may also give you specific instructions. While your treatment has been planned according to the most current medical practices available, unavoidable complications occasionally occur. If you have any problems or questions after discharge, call Dr. Gala Romney at [phone]. ?ACTIVITY ?You may resume your regular activity, but move at a slower pace for the next 24 hours.  ?Take frequent rest periods for the next 24 hours.  ?Walking will help get rid of the air and reduce the bloated feeling in your belly (abdomen).  ?No driving for 24 hours (because of the medicine (anesthesia) used during the test).   ?Do not sign any important legal documents or operate any machinery for 24 hours (because of the anesthesia used during the test).  ?NUTRITION ?Drink plenty of fluids.  ?You may resume your normal diet as instructed by your doctor.  ?Begin with a light meal and progress to your normal diet. Heavy or fried foods are harder to digest and may make you feel sick to your stomach (nauseated).  ?Avoid alcoholic beverages for 24 hours or as instructed.  ?MEDICATIONS ?You may resume your normal medications unless your doctor tells you otherwise.  ?WHAT YOU CAN EXPECT TODAY ?Some feelings of bloating in the abdomen.  ?Passage of more gas than usual.  ?Spotting of blood in your stool or on the toilet paper.  ?IF YOU HAD POLYPS REMOVED DURING THE COLONOSCOPY: ?No aspirin products for 7 days or as instructed.  ?No alcohol for 7 days or as instructed.  ?Eat a soft diet for the next 24 hours.  ?FINDING OUT THE RESULTS OF YOUR TEST ?Not all test results are available during your visit. If your test results are not back during the visit, make an appointment with your caregiver to find out the  results. Do not assume everything is normal if you have not heard from your caregiver or the medical facility. It is important for you to follow up on all of your test results.  ?SEEK IMMEDIATE MEDICAL ATTENTION IF: ?You have more than a spotting of blood in your stool.  ?Your belly is swollen (abdominal distention).  ?You are nauseated or vomiting.  ?You have a temperature over 101.  ?You have abdominal pain or discomfort that is severe or gets worse throughout the day.  ? ? ?Your colonoscopy was normal today ? ?          It is recommended you return in 10 years for repeat colonoscopy ?  ?         At patient request, I called Gwenevere Ghazi at [phone] a message ?

## 2021-12-22 NOTE — H&P (Signed)
?$'@LOGO'r$ @ ? ? ?Primary Care Physician:  Redmond School, MD ?Primary Gastroenterologist:  Dr. Gala Romney ? ?Pre-Procedure History & Physical: ?HPI:  Erin Berg is a 56 y.o. female is here for a screening colonoscopy.  No bowel symptoms.  Grandmother with colon cancer after the age of 35.  No bowel symptoms no other family history.  No prior colonoscopy. ? ?Past Medical History:  ?Diagnosis Date  ? Anxiety and depression   ? BMI 24.0-24.9, adult   ? MS (multiple sclerosis) ([place])   ? Multiple sclerosis ([place])   ? ? ?Past Surgical History:  ?Procedure Laterality Date  ? TONSILLECTOMY AND ADENOIDECTOMY    ? ? ?Prior to Admission medications   ?Medication Sig Start Date End Date Taking? Authorizing Provider  ?acetaminophen (TYLENOL) 500 MG tablet Take 1,000 mg by mouth every 6 (six) hours as needed for moderate pain.   Yes [provider]  ?gabapentin (NEURONTIN) 300 MG capsule Take 300 mg by mouth 2 (two) times daily.   Yes [provider]  ?OVER THE COUNTER MEDICATION Take 1 tablet by mouth daily. [place]   Yes [provider]  ?rosuvastatin (CRESTOR) 5 MG tablet Take 5 mg by mouth at bedtime.   Yes [provider]  ?TECFIDERA 240 MG CPDR Take 240 mg by mouth 2 (two) times daily. 03/25/15  Yes [provider]  ?ibuprofen (ADVIL) 800 MG tablet Take 800 mg by mouth every 8 (eight) hours as needed for moderate pain.    [provider]  ? ? ?Allergies as of 11/24/2021 - Review Complete 11/12/2021  ?Allergen Reaction Noted  ? Sulfamethoxazole-trimethoprim  09/30/2007  ? ? ?Family History  ?Problem Relation Age of Onset  ? Breast cancer Mother   ? Colon cancer Maternal Grandmother   ? Breast cancer Paternal Grandmother   ? ? ?Social History  ? ?Socioeconomic History  ? Marital status: Unknown  ?  Spouse name: Not on file  ? Number of children: Not on file  ? Years of education: Not on file  ? Highest education level: Not on file  ?Occupational History  ? Not  on file  ?Tobacco Use  ? Smoking status: Former  ?  Types: Cigarettes  ? Smokeless tobacco: Never  ?Substance and Sexual Activity  ? Alcohol use: No  ?  Alcohol/week: 0.0 standard drinks  ? Drug use: No  ? Sexual activity: Not on file  ?Other Topics Concern  ? Not on file  ?Social History Narrative  ? Not on file  ? ?Social Determinants of Health  ? ?Financial Resource Strain: Not on file  ?Food Insecurity: Not on file  ?Transportation Needs: Not on file  ?Physical Activity: Not on file  ?Stress: Not on file  ?Social Connections: Not on file  ?Intimate Partner Violence: Not on file  ? ? ?Review of Systems: ?See HPI, otherwise negative ROS ? ?Physical Exam: ?LMP 08/01/2019 (Exact Date)  ?General:   Alert,  Well-developed, well-nourished, pleasant and cooperative in NAD ?Lungs:  Clear throughout to auscultation.   No wheezes, crackles, or rhonchi. No acute distress. ?Heart:  Regular rate and rhythm; no murmurs, clicks, rubs,  or gallops. ?Abdomen:  Soft, nontender and nondistended. No masses, hepatosplenomegaly or hernias noted. Normal bowel sounds, without guarding, and without rebound.   ? ?Impression/Plan: ?Erin Berg is now here to undergo a screening colonoscopy.  First-ever screening examination. ? ?Risks, benefits, limitations, imponderables and alternatives regarding colonoscopy have been reviewed with the patient. Questions have been  answered. All parties agreeable.  ? ? ? ?Notice:  This dictation was prepared with Dragon dictation along with smaller phrase technology. Any transcriptional errors that result from this process are unintentional and may not be corrected upon review.  ?

## 2021-12-22 NOTE — Op Note (Signed)
[facility] ?Patient Name: Erin Berg ?Procedure Date: 12/22/2021 10:43 AM ?MRN: [id number] ?Date of Birth: [birth year] ?Attending MD: Norvel Richards , MD ?CSN: [id number] ?Age: 56 ?Admit Type: Outpatient ?Procedure:                Colonoscopy ?Indications:              Screening for colorectal malignant neoplasm ?Providers:                Norvel Richards, MD, Crystal Page, Eugene Garnet  ?                          Shanon Brow, Technician ?Referring MD:              ?Medicines:                Propofol per Anesthesia ?Complications:            No immediate complications. ?Estimated Blood Loss:     Estimated blood loss: none. ?Procedure:                Pre-Anesthesia Assessment: ?                          - Prior to the procedure, a History and Physical  ?                          was performed, and patient medications and  ?                          allergies were reviewed. The patient's tolerance of  ?                          previous anesthesia was also reviewed. The risks  ?                          and benefits of the procedure and the sedation  ?                          options and risks were discussed with the patient.  ?                          All questions were answered, and informed consent  ?                          was obtained. Prior Anticoagulants: The patient has  ?                          taken no previous anticoagulant or antiplatelet  ?                          agents. ASA Grade Assessment: II - A patient with  ?                          mild systemic disease. After reviewing the risks  ?  and benefits, the patient was deemed in  ?                          satisfactory condition to undergo the procedure. ?                          After obtaining informed consent, the colonoscope  ?                          was passed under direct vision. Throughout the  ?                          procedure, the patient's blood pressure, pulse, and  ?                          oxygen  saturations were monitored continuously. The  ?                          [id number] scope was introduced through the  ?                          anus and advanced to the the cecum, identified by  ?                          appendiceal orifice and ileocecal valve. The  ?                          colonoscopy was performed without difficulty. The  ?                          patient tolerated the procedure well. The quality  ?                          of the bowel preparation was adequate. ?Scope In: 11:10:39 AM ?Scope Out: 11:26:22 AM ?Scope Withdrawal Time: 0 hours 8 minutes 4 seconds  ?Total Procedure Duration: 0 hours 15 minutes 43 seconds  ?Findings: ?     The perianal and digital rectal examinations were normal. ?     The colon (entire examined portion) appeared normal. ?     The retroflexed view of the distal rectum and anal verge was normal and  ?     showed no anal or rectal abnormalities. ?Impression:               - The entire examined colon is normal. ?                          - The distal rectum and anal verge are normal on  ?                          retroflexion view. ?                          - No specimens collected. ?Moderate Sedation: ?     Moderate (conscious) sedation was personally administered by an  ?     anesthesia professional.  The following parameters were monitored: oxygen  ?     saturation, heart rate, blood pressure, respiratory rate, EKG, adequacy  ?     of pulmonary ventilation, and response to care. ?Recommendation:           - Patient has a contact number available for  ?                          emergencies. The signs and symptoms of potential  ?                          delayed complications were discussed with the  ?                          patient. Return to normal activities tomorrow.  ?                          Written discharge instructions were provided to the  ?                          patient. ?                          - Resume previous diet. ?                           - Continue present medications. ?                          - Repeat colonoscopy in 10 years for screening  ?                          purposes. ?                          - Return to GI office in 1 week. ?Procedure Code(s):        --- Professional --- ?                          [phone], Colonoscopy, flexible; diagnostic, including  ?                          collection of specimen(s) by brushing or washing,  ?                          when performed (separate procedure) ?Diagnosis Code(s):        --- Professional --- ?                          Z12.11, Encounter for screening for malignant  ?                          neoplasm of colon ?CPT copyright 2019 [employer]. All rights reserved. ?The codes documented in this report are preliminary and upon coder review may  ?be revised to meet current compliance requirements. ?Cristopher Estimable. Kamdon Reisig, MD ?Norvel Richards, MD ?12/22/2021 11:33:51 AM ?This report has been signed  electronically. ?Number of Addenda: 0 ?

## 2021-12-22 NOTE — Anesthesia Preprocedure Evaluation (Signed)
Anesthesia Evaluation  ?Patient identified by MRN, date of birth, ID band ?Patient awake ? ? ? ?Reviewed: ?Allergy & Precautions, H&P , NPO status , Patient's Chart, lab work & pertinent test results, reviewed documented beta blocker date and time  ? ?Airway ?Mallampati: II ? ?TM Distance: >3 FB ?Neck ROM: full ? ? ? Dental ?no notable dental hx. ? ?  ?Pulmonary ?neg pulmonary ROS, former smoker,  ?  ?Pulmonary exam normal ?breath sounds clear to auscultation ? ? ? ? ? ? Cardiovascular ?Exercise Tolerance: Good ?negative cardio ROS ? ? ?Rhythm:regular Rate:Normal ? ? ?  ?Neuro/Psych ?PSYCHIATRIC DISORDERS Anxiety Depression negative neurological ROS ?   ? GI/Hepatic ?negative GI ROS, Neg liver ROS,   ?Endo/Other  ?negative endocrine ROS ? Renal/GU ?negative Renal ROS  ?negative genitourinary ?  ?Musculoskeletal ? ? Abdominal ?  ?Peds ? Hematology ?negative hematology ROS ?(+)   ?Anesthesia Other Findings ? ? Reproductive/Obstetrics ?negative OB ROS ? ?  ? ? ? ? ? ? ? ? ? ? ? ? ? ?  ?  ? ? ? ? ? ? ? ? ?Anesthesia Physical ?Anesthesia Plan ? ?ASA: 2 ? ?Anesthesia Plan: General  ? ?Post-op Pain Management:   ? ?Induction:  ? ?PONV Risk Score and Plan: Propofol infusion ? ?Airway Management Planned:  ? ?Additional Equipment:  ? ?Intra-op Plan:  ? ?Post-operative Plan:  ? ?Informed Consent: I have reviewed the patients History and Physical, chart, labs and discussed the procedure including the risks, benefits and alternatives for the proposed anesthesia with the patient or authorized representative who has indicated his/her understanding and acceptance.  ? ? ? ?Dental Advisory Given ? ?Plan Discussed with: CRNA ? ?Anesthesia Plan Comments:   ? ? ? ? ? ? ?Anesthesia Quick Evaluation ? ?

## 2021-12-22 NOTE — Transfer of Care (Signed)
Immediate Anesthesia Transfer of Care Note ? ?Patient: Erin Berg ? ?Procedure(s) Performed: COLONOSCOPY WITH PROPOFOL ? ?Patient Location: Endoscopy Unit ? ?Anesthesia Type:General ? ?Level of Consciousness: awake ? ?Airway & Oxygen Therapy: Patient Spontanous Breathing ? ?Post-op Assessment: Report given to RN ? ?Post vital signs: Reviewed ? ?Last Vitals:  ?Vitals Value Taken Time  ?BP    ?Temp    ?Pulse    ?Resp    ?SpO2    ? ? ?Last Pain:  ?Vitals:  ? 12/22/21 1052  ?TempSrc: Oral  ?PainSc: 0-No pain  ?   ? ?  ? ?Complications: No notable events documented. ?

## 2021-12-23 DIAGNOSIS — G35 Multiple sclerosis: Secondary | ICD-10-CM | POA: Diagnosis not present

## 2021-12-23 NOTE — Addendum Note (Signed)
Addendum  created 12/23/21 1231 by Ollen Bowl, CRNA  ? Intraprocedure Staff edited  ?  ?

## 2021-12-29 ENCOUNTER — Encounter (HOSPITAL_COMMUNITY): Payer: Self-pay | Admitting: Internal Medicine

## 2022-01-23 DIAGNOSIS — G35 Multiple sclerosis: Secondary | ICD-10-CM | POA: Diagnosis not present

## 2022-02-12 ENCOUNTER — Encounter: Payer: Self-pay | Admitting: Internal Medicine

## 2022-02-19 DIAGNOSIS — E785 Hyperlipidemia, unspecified: Secondary | ICD-10-CM | POA: Diagnosis not present

## 2022-02-19 DIAGNOSIS — Z1331 Encounter for screening for depression: Secondary | ICD-10-CM | POA: Diagnosis not present

## 2022-02-19 DIAGNOSIS — E6609 Other obesity due to excess calories: Secondary | ICD-10-CM | POA: Diagnosis not present

## 2022-02-19 DIAGNOSIS — G35 Multiple sclerosis: Secondary | ICD-10-CM | POA: Diagnosis not present

## 2022-02-19 DIAGNOSIS — Z0000 Encounter for general adult medical examination without abnormal findings: Secondary | ICD-10-CM | POA: Diagnosis not present

## 2022-02-19 DIAGNOSIS — Z6835 Body mass index (BMI) 35.0-35.9, adult: Secondary | ICD-10-CM | POA: Diagnosis not present

## 2022-02-19 DIAGNOSIS — R69 Illness, unspecified: Secondary | ICD-10-CM | POA: Diagnosis not present

## 2022-02-19 DIAGNOSIS — F329 Major depressive disorder, single episode, unspecified: Secondary | ICD-10-CM | POA: Diagnosis not present

## 2022-02-19 DIAGNOSIS — F419 Anxiety disorder, unspecified: Secondary | ICD-10-CM | POA: Diagnosis not present

## 2022-02-22 DIAGNOSIS — G35 Multiple sclerosis: Secondary | ICD-10-CM | POA: Diagnosis not present

## 2022-03-20 ENCOUNTER — Encounter (HOSPITAL_COMMUNITY): Payer: Self-pay

## 2022-03-20 ENCOUNTER — Emergency Department (HOSPITAL_COMMUNITY): Payer: Medicare HMO

## 2022-03-20 ENCOUNTER — Other Ambulatory Visit: Payer: Self-pay

## 2022-03-20 ENCOUNTER — Inpatient Hospital Stay (HOSPITAL_COMMUNITY)
Admission: EM | Admit: 2022-03-20 | Discharge: 2022-03-24 | DRG: 059 | Disposition: A | Discharge status: Home Health | Payer: Medicare HMO | Attending: Family Medicine | Admitting: Family Medicine

## 2022-03-20 DIAGNOSIS — Z79899 Other long term (current) drug therapy: Secondary | ICD-10-CM | POA: Diagnosis not present

## 2022-03-20 DIAGNOSIS — Z882 Allergy status to sulfonamides status: Secondary | ICD-10-CM | POA: Diagnosis not present

## 2022-03-20 DIAGNOSIS — G35 Multiple sclerosis: Secondary | ICD-10-CM | POA: Diagnosis not present

## 2022-03-20 DIAGNOSIS — R8281 Pyuria: Secondary | ICD-10-CM

## 2022-03-20 DIAGNOSIS — R296 Repeated falls: Secondary | ICD-10-CM | POA: Diagnosis not present

## 2022-03-20 DIAGNOSIS — N390 Urinary tract infection, site not specified: Secondary | ICD-10-CM | POA: Diagnosis not present

## 2022-03-20 DIAGNOSIS — Z87891 Personal history of nicotine dependence: Secondary | ICD-10-CM

## 2022-03-20 DIAGNOSIS — M25551 Pain in right hip: Secondary | ICD-10-CM | POA: Diagnosis not present

## 2022-03-20 DIAGNOSIS — F419 Anxiety disorder, unspecified: Secondary | ICD-10-CM | POA: Diagnosis present

## 2022-03-20 DIAGNOSIS — Z7952 Long term (current) use of systemic steroids: Secondary | ICD-10-CM

## 2022-03-20 DIAGNOSIS — E785 Hyperlipidemia, unspecified: Secondary | ICD-10-CM

## 2022-03-20 DIAGNOSIS — R69 Illness, unspecified: Secondary | ICD-10-CM | POA: Diagnosis not present

## 2022-03-20 DIAGNOSIS — F32A Depression, unspecified: Secondary | ICD-10-CM | POA: Diagnosis present

## 2022-03-20 DIAGNOSIS — Z993 Dependence on wheelchair: Secondary | ICD-10-CM | POA: Diagnosis not present

## 2022-03-20 DIAGNOSIS — R29898 Other symptoms and signs involving the musculoskeletal system: Secondary | ICD-10-CM

## 2022-03-20 DIAGNOSIS — B9620 Unspecified Escherichia coli [E. coli] as the cause of diseases classified elsewhere: Secondary | ICD-10-CM | POA: Diagnosis present

## 2022-03-20 DIAGNOSIS — W06XXXA Fall from bed, initial encounter: Secondary | ICD-10-CM | POA: Diagnosis present

## 2022-03-20 DIAGNOSIS — M62838 Other muscle spasm: Secondary | ICD-10-CM | POA: Diagnosis not present

## 2022-03-20 DIAGNOSIS — G319 Degenerative disease of nervous system, unspecified: Secondary | ICD-10-CM | POA: Diagnosis not present

## 2022-03-20 DIAGNOSIS — E876 Hypokalemia: Secondary | ICD-10-CM

## 2022-03-20 DIAGNOSIS — R0902 Hypoxemia: Secondary | ICD-10-CM | POA: Diagnosis not present

## 2022-03-20 DIAGNOSIS — Y92003 Bedroom of unspecified non-institutional (private) residence as the place of occurrence of the external cause: Secondary | ICD-10-CM

## 2022-03-20 DIAGNOSIS — Z743 Need for continuous supervision: Secondary | ICD-10-CM | POA: Diagnosis not present

## 2022-03-20 DIAGNOSIS — I10 Essential (primary) hypertension: Secondary | ICD-10-CM | POA: Diagnosis not present

## 2022-03-20 LAB — URINALYSIS, ROUTINE W REFLEX MICROSCOPIC
Bilirubin Urine: NEGATIVE
Glucose, UA: NEGATIVE mg/dL
Ketones, ur: 80 mg/dL — AB
Leukocytes,Ua: NEGATIVE
Nitrite: POSITIVE — AB
Protein, ur: NEGATIVE mg/dL
Specific Gravity, Urine: 1.03 (ref 1.005–1.030)
pH: 5 (ref 5.0–8.0)

## 2022-03-20 LAB — CBC WITH DIFFERENTIAL/PLATELET
Abs Immature Granulocytes: 0.02 10*3/uL (ref 0.00–0.07)
Basophils Absolute: 0 10*3/uL (ref 0.0–0.1)
Basophils Relative: 1 %
Eosinophils Absolute: 0 10*3/uL (ref 0.0–0.5)
Eosinophils Relative: 0 %
HCT: 42.3 % (ref 36.0–46.0)
Hemoglobin: 13.7 g/dL (ref 12.0–15.0)
Immature Granulocytes: 0 %
Lymphocytes Relative: 19 %
Lymphs Abs: 1.3 10*3/uL (ref 0.7–4.0)
MCH: 26.9 pg (ref 26.0–34.0)
MCHC: 32.4 g/dL (ref 30.0–36.0)
MCV: 83.1 fL (ref 80.0–100.0)
Monocytes Absolute: 0.5 10*3/uL (ref 0.1–1.0)
Monocytes Relative: 7 %
Neutro Abs: 5.1 10*3/uL (ref 1.7–7.7)
Neutrophils Relative %: 73 %
Platelets: 249 10*3/uL (ref 150–400)
RBC: 5.09 MIL/uL (ref 3.87–5.11)
RDW: 14.8 % (ref 11.5–15.5)
WBC: 7 10*3/uL (ref 4.0–10.5)
nRBC: 0 % (ref 0.0–0.2)

## 2022-03-20 LAB — HIV ANTIBODY (ROUTINE TESTING W REFLEX): HIV Screen 4th Generation wRfx: NONREACTIVE

## 2022-03-20 LAB — BASIC METABOLIC PANEL
Anion gap: 8 (ref 5–15)
BUN: 19 mg/dL (ref 6–20)
CO2: 27 mmol/L (ref 22–32)
Calcium: 9.6 mg/dL (ref 8.9–10.3)
Chloride: 106 mmol/L (ref 98–111)
Creatinine, Ser: 0.52 mg/dL (ref 0.44–1.00)
GFR, Estimated: >60 mL/min (ref >60)
Glucose, Bld: 97 mg/dL (ref 70–99)
Potassium: 3.2 mmol/L — ABNORMAL LOW (ref 3.5–5.1)
Sodium: 141 mmol/L (ref 135–145)

## 2022-03-20 LAB — CK: Total CK: 25 U/L — ABNORMAL LOW (ref 38–234)

## 2022-03-20 LAB — GLUCOSE, CAPILLARY: Glucose-Capillary: 155 mg/dL — ABNORMAL HIGH (ref 70–99)

## 2022-03-20 MED ORDER — SODIUM CHLORIDE 0.9 % IV SOLN
1000.0000 mg | Freq: Every day | INTRAVENOUS | Status: AC
  Administered 2022-03-21 – 2022-03-22 (×2): 1000 mg via INTRAVENOUS
  Filled 2022-03-20 (×2): qty 16

## 2022-03-20 MED ORDER — PANTOPRAZOLE SODIUM 40 MG PO TBEC
40.0000 mg | DELAYED_RELEASE_TABLET | Freq: Two times a day (BID) | ORAL | Status: DC
  Administered 2022-03-20 – 2022-03-24 (×8): 40 mg via ORAL
  Filled 2022-03-20 (×8): qty 1

## 2022-03-20 MED ORDER — DIMETHYL FUMARATE 240 MG PO CPDR
240.0000 mg | DELAYED_RELEASE_CAPSULE | Freq: Two times a day (BID) | ORAL | Status: DC
  Administered 2022-03-21 – 2022-03-24 (×6): 240 mg via ORAL

## 2022-03-20 MED ORDER — ONDANSETRON HCL 4 MG/2ML IJ SOLN: 4.0000 mg | Freq: Four times a day (QID) | INTRAMUSCULAR | Status: DC | PRN

## 2022-03-20 MED ORDER — FLUOXETINE HCL 20 MG PO CAPS
20.0000 mg | ORAL_CAPSULE | Freq: Every day | ORAL | Status: DC
  Administered 2022-03-21 – 2022-03-24 (×4): 20 mg via ORAL
  Filled 2022-03-20 (×4): qty 1

## 2022-03-20 MED ORDER — METHYLPREDNISOLONE SODIUM SUCC 125 MG IJ SOLR: 1000.0000 mg | Freq: Once | INTRAMUSCULAR | Status: DC

## 2022-03-20 MED ORDER — METHOCARBAMOL 500 MG PO TABS
500.0000 mg | ORAL_TABLET | Freq: Four times a day (QID) | ORAL | Status: DC | PRN
  Administered 2022-03-20 – 2022-03-24 (×7): 500 mg via ORAL
  Filled 2022-03-20 (×7): qty 1

## 2022-03-20 MED ORDER — MORPHINE SULFATE (PF) 2 MG/ML IV SOLN: 2.0000 mg | INTRAVENOUS | Status: DC | PRN

## 2022-03-20 MED ORDER — GADOBUTROL 1 MMOL/ML IV SOLN
9.0000 mL | Freq: Once | INTRAVENOUS | Status: AC | PRN
  Administered 2022-03-20: 9 mL via INTRAVENOUS

## 2022-03-20 MED ORDER — DALFAMPRIDINE ER 10 MG PO TB12
10.0000 mg | ORAL_TABLET | Freq: Two times a day (BID) | ORAL | Status: DC
  Administered 2022-03-21: 10 mg via ORAL

## 2022-03-20 MED ORDER — SODIUM CHLORIDE 0.9 % IV SOLN
1000.0000 mg | Freq: Once | INTRAVENOUS | Status: AC
  Administered 2022-03-20: 1000 mg via INTRAVENOUS
  Filled 2022-03-20: qty 16

## 2022-03-20 MED ORDER — OXYCODONE HCL 5 MG PO TABS
5.0000 mg | ORAL_TABLET | ORAL | Status: DC | PRN
  Administered 2022-03-22: 5 mg via ORAL
  Filled 2022-03-20: qty 1

## 2022-03-20 MED ORDER — MORPHINE SULFATE (PF) 4 MG/ML IV SOLN
4.0000 mg | Freq: Once | INTRAVENOUS | Status: AC
  Administered 2022-03-20: 4 mg via INTRAVENOUS
  Filled 2022-03-20: qty 1

## 2022-03-20 MED ORDER — ENOXAPARIN SODIUM 40 MG/0.4ML IJ SOSY
40.0000 mg | PREFILLED_SYRINGE | INTRAMUSCULAR | Status: DC
  Administered 2022-03-20 – 2022-03-23 (×4): 40 mg via SUBCUTANEOUS
  Filled 2022-03-20 (×4): qty 0.4

## 2022-03-20 MED ORDER — ACETAMINOPHEN 325 MG PO TABS
650.0000 mg | ORAL_TABLET | Freq: Four times a day (QID) | ORAL | Status: DC | PRN
  Administered 2022-03-22 – 2022-03-24 (×3): 650 mg via ORAL
  Filled 2022-03-20 (×3): qty 2

## 2022-03-20 MED ORDER — POTASSIUM CHLORIDE CRYS ER 20 MEQ PO TBCR
40.0000 meq | EXTENDED_RELEASE_TABLET | Freq: Four times a day (QID) | ORAL | Status: AC
  Administered 2022-03-20 (×2): 40 meq via ORAL
  Filled 2022-03-20 (×2): qty 2

## 2022-03-20 MED ORDER — ONDANSETRON HCL 4 MG PO TABS: 4.0000 mg | ORAL_TABLET | Freq: Four times a day (QID) | ORAL | Status: DC | PRN

## 2022-03-20 MED ORDER — FENTANYL CITRATE PF 50 MCG/ML IJ SOSY
50.0000 ug | PREFILLED_SYRINGE | Freq: Once | INTRAMUSCULAR | Status: AC
  Administered 2022-03-20: 50 ug via INTRAVENOUS
  Filled 2022-03-20: qty 1

## 2022-03-20 MED ORDER — SODIUM CHLORIDE 0.9 % IV SOLN
1.0000 g | INTRAVENOUS | Status: AC
  Administered 2022-03-20 – 2022-03-22 (×3): 1 g via INTRAVENOUS
  Filled 2022-03-20 (×3): qty 10

## 2022-03-20 MED ORDER — ACETAMINOPHEN 650 MG RE SUPP: 650.0000 mg | Freq: Four times a day (QID) | RECTAL | Status: DC | PRN

## 2022-03-20 NOTE — ED Provider Notes (Signed)
[facility] EMERGENCY DEPARTMENT Provider Note   CSN: [id number] Arrival date & time: 03/20/22  1050     History  Chief Complaint  Patient presents with   Leg Pain    Erin Berg is a 56 y.o. female with history of MS who presents here to the emergency department for evaluation of a suspected MS flareup.  Patient is typically wheelchair-bound at home due to her MS.  She states her flareup symptoms started about 2 weeks ago.  She saw her PCP who gave her an injection of steroids and then sent her home with a steroid pack.  She does not feel that this is helping her symptoms.  She states that she had a fall yesterday.  She put her feet over the side of the bed and just fell forward.  No head injury or loss of consciousness.  Her son was home and able to get her back in the bed.  Her home health aide came in today and patient was complaining of severe lower extremity pain secondary to her MS and called EMS.  She also complains of numbness of the bilateral lower extremities.  Patient states she has not suffered any injury from the fall yesterday and that this pain is related to her MS and feels similar to previous MS flareups.  Her neurologist is Dr. Merlene Laughter with [facility], however she states she has not seen him in a couple of years and typically sees his PA.  She has not seen either of them in some time.  She denies headache, vision changes, chest pain, shortness of breath, abdominal pain, nausea, vomiting or diarrhea.  HPI     Home Medications Prior to Admission medications   Medication Sig Start Date End Date Taking? Authorizing Provider  acetaminophen (TYLENOL) 500 MG tablet Take 1,000 mg by mouth every 6 (six) hours as needed for moderate pain.   Yes [provider]  dalfampridine 10 MG TB12 Take 1 tablet by mouth 2 (two) times daily. 08/20/21  Yes [provider]  FLUoxetine (PROZAC) 20 MG capsule Take 20 mg by mouth daily. 12/19/21  Yes [provider]   gabapentin (NEURONTIN) 300 MG capsule Take 300 mg by mouth 2 (two) times daily.   Yes [provider]  predniSONE (DELTASONE) 10 MG tablet Take 4 tablets by mouth daily. 4tabs x 3days,3tabs x3days, 2tabs x3days 1tab 03/16/22  Yes [provider]  rosuvastatin (CRESTOR) 5 MG tablet Take 5 mg by mouth at bedtime.   Yes [provider]  TECFIDERA 240 MG CPDR Take 240 mg by mouth 2 (two) times daily. 03/25/15  Yes [provider]  OVER THE COUNTER MEDICATION Take 1 tablet by mouth daily. [place]    [provider]      Allergies    Sulfamethoxazole-trimethoprim    Review of Systems   Review of Systems  Constitutional:  Negative for fever.  Eyes:  Negative for redness and visual disturbance.  Cardiovascular:  Negative for chest pain.  Gastrointestinal:  Negative for abdominal pain.  Neurological:  Positive for weakness and numbness. Negative for dizziness and syncope.  All other systems reviewed and are negative.   Physical Exam Updated Vital Signs BP (!) 145/87   Pulse 91   Temp 98.7 F (37.1 C) (Oral)   Resp 18   LMP 08/01/2019 (Exact Date)   SpO2 95%  Physical Exam Vitals and nursing note reviewed.  Constitutional:      General: She is not  in acute distress.    Appearance: She is not ill-appearing.  HENT:     Head: Atraumatic.  Eyes:     Conjunctiva/sclera: Conjunctivae normal.  Cardiovascular:     Rate and Rhythm: Normal rate and regular rhythm.     Pulses: Normal pulses.     Heart sounds: No murmur heard. Pulmonary:     Effort: Pulmonary effort is normal. No respiratory distress.     Breath sounds: Normal breath sounds.  Abdominal:     General: Abdomen is flat. There is no distension.     Palpations: Abdomen is soft.     Tenderness: There is no abdominal tenderness.  Musculoskeletal:        General: Normal range of motion.     Cervical back: Normal range of motion.  Skin:    General: Skin is warm and dry.      Capillary Refill: Capillary refill takes less than 2 seconds.  Neurological:     Mental Status: She is alert.     Comments: Speech is clear, able to follow commands CN III-XII intact  Good straight leg raise against gravity of the left lower extremity, unable to raise right lower extremity off of the table due to weakness.  Subjective sensation intact bilaterally.   Strong grip strength bilaterally. Moves extremities without ataxia, coordination intact Normal finger to nose and rapid alternating movements No pronator drift    Psychiatric:        Mood and Affect: Mood normal.     ED Results / Procedures / Treatments   Labs (all labs ordered are listed, but only abnormal results are displayed) Labs Reviewed  BASIC METABOLIC PANEL - Abnormal; Notable for the following components:      Result Value   Potassium 3.2 (*)    All other components within normal limits  URINALYSIS, ROUTINE W REFLEX MICROSCOPIC - Abnormal; Notable for the following components:   APPearance HAZY (*)    Hgb urine dipstick MODERATE (*)    Ketones, ur 80 (*)    Nitrite POSITIVE (*)    Bacteria, UA RARE (*)    All other components within normal limits  CBC WITH DIFFERENTIAL/PLATELET    EKG None  Radiology MR Brain W and Wo Contrast  Result Date: 03/20/2022 CLINICAL DATA:  Provided history: Multiple sclerosis. MS flare up. EXAM: MRI HEAD WITHOUT AND WITH CONTRAST MRI CERVICAL SPINE WITHOUT AND WITH CONTRAST MRI THORACIC SPINE WITHOUT AND WITH CONTRAST CONTRAST:  75m GADAVIST GADOBUTROL 1 MMOL/ML IV SOLN TECHNIQUE: Multiplanar, multiecho pulse sequences of the brain and surrounding structures, and cervical and thoracic spine were obtained without and with intravenous contrast. COMPARISON:  Prior brain MRI examinations 02/08/2020 and earlier. Cervical spine MRI 03/14/2014. FINDINGS: MRI HEAD FINDINGS The examination is intermittently motion degraded. Most notably, there is severe motion degradation of the  pre-contrast axial T1-weighted sequence and moderate-to-severe motion degradation of the coronal T2 sequence. Brain: Age advanced parenchymal atrophy. Severe patchy and confluent T2 FLAIR hyperintense signal abnormality within the cerebral white matter. Diffuse T2 FLAIR hyperintense signal abnormality and atrophy of the corpus callosum. 13 x 8 mm peripherally enhancing lesion within the posterior right temporal lobe white matter compatible with a site of active demyelination. 3 mm focus of enhancement within the posterior right frontal lobe white matter, likely reflecting an additional site of active demyelination (series 21, image 15) (series 20, image 37). No new white matter lesion is identified elsewhere. There is no acute infarct. No evidence of an intracranial mass.  No chronic intracranial blood products. No extra-axial fluid collection. No midline shift. Vascular: Maintained flow voids within the proximal large arterial vessels. Skull and upper cervical spine: No focal suspicious marrow lesion. Sinuses/Orbits: No mass or acute finding within the imaged orbits. 2 cm mucous retention cyst within the left maxillary sinus. Mild mucosal thickening within the left frontal sinus. MRI CERVICAL SPINE FINDINGS Intermittently motion degraded examination, limiting evaluation. Most notably, there is moderate to severe motion degradation of the sagittal T2 sequence, moderate to severe motion degradation of the sagittal STIR sequence, moderate motion degradation of the axial T2-weighted sequences and moderate motion degradation of the sagittal T1-weighted post-contrast sequence. Alignment: Reversal of the expected cervical lordosis. No significant spondylolisthesis. Vertebrae: Vertebral body height is maintained. Within the limitations of motion degradation, no significant marrow edema or focal suspicious osseous lesion is identified. Degenerative endplate irregularity at C5-C6. Cord: Small T2 hyperintense lesions within  the posterior aspect of the spinal cord at the C2-C3 level, unchanged from the prior examination of 03/14/2014. No other definite lesions are identified within the cervical spinal cord, although motion degradation significantly limits evaluation. Within the limitations of motion degradation, no definite pathologic enhancement of the cervical spinal cord is appreciated. Posterior Fossa, vertebral arteries, paraspinal tissues: Posterior fossa better assessed on concurrently performed brain MRI. Flow voids preserved within the imaged cervical vertebral arteries. No paraspinal mass or collection. Disc levels: Unless otherwise stated, the level by level findings below have not significantly changed from the prior MRI of 03/14/2014. Progressive disc degeneration. Most notably, progressive mild-to-moderate disc degeneration is present at C5-C6. C2-C3: Slight disc bulge, new from the prior MRI. No significant spinal canal or foraminal stenosis. C3-C4: No significant disc herniation or stenosis. C4-C5: No significant disc herniation or stenosis. C5-C6: Disc bulge with endplate spurring and bilateral uncovertebral hypertrophy, progressed. The disc bulge mildly effaces the ventral thecal sac and may contact the ventral aspect of the spinal cord. Progressive bilateral neural foraminal narrowing (mild right, mild to moderate left). C6-C7: Shallow disc bulge, new from the prior MRI. Mild effacement of the ventral thecal sac (without spinal cord mass effect). No significant foraminal stenosis. C7-T1: No significant disc herniation or stenosis. MRI THORACIC SPINE FINDINGS The examination is intermittently motion degraded, limiting evaluation. Most notably, the sagittal T2 sequence is mild to moderately motion degraded, the axial T2 weighted sequences are moderately motion degraded, the sagittal T1 weighted post-contrast sequence is severely motion degraded and the axial T1 weighted post-contrast sequence is severely motion  degraded. Alignment:  No significant spondylolisthesis. Vertebrae: Vertebral body height is maintained. No significant marrow edema or focal suspicious osseous lesion. Cord: Short segment T2 hyperintense lesions (versus artifact) within the left aspect of the spinal cord at the T3 level (series 6, image 9) and within the anterior and right aspect of the spinal cord at the T11 level (series 6, image 33). No definite lesions are identified elsewhere within the spinal cord at the thoracic levels, although motion degradation significantly limits evaluation. Severely motion degraded postcontrast imaging, precluding evaluation for pathologic spinal cord enhancement at the thoracic levels. Paraspinal and other soft tissues: No abnormality is identified within included portions of the thorax or upper abdomen/retroperitoneum. No paraspinal mass or collection. Disc levels: No more than mild disc degeneration within the thoracic spine. Small multilevel disc bulges. No focal disc herniation, significant spinal canal stenosis or neural foraminal narrowing. IMPRESSION: MRI brain: 1. Significant intermittent motion degradation. 2. Extensive T2 FLAIR hyperintense signal abnormality within the supratentorial white matter,  compatible with the provided history of multiple sclerosis. 13 x 8 mm peripherally enhancing lesion within the posterior right temporal lobe white matter, consistent with a site of active demyelination. 3 mm focus of enhancement within the posterior right frontal lobe white matter, likely reflecting an additional site of active demyelination. No other definitively new lesions are identified. 3. Age advanced parenchymal atrophy. 4. Paranasal sinus disease, as described. MRI cervical spine: 1. Significantly motion degraded examination. 2. Small T2 hyperintense lesions within the posterior aspect of the spinal cord at the C2-C3 level, unchanged from the prior MRI of 03/14/2014. No definite lesions are identified  elsewhere within the cervical spinal cord, although motion degradation significantly limits evaluation. No appreciable pathologic spinal cord enhancement. 3. Progressive cervical spondylosis, greatest at C5-C6. MRI thoracic spine: 1. Significantly motion degraded examination. 2. Short-segment T2 hyperintense lesions (versus artifact) within the left aspect of the spinal cord at the T3 level, and within the ventral and right aspect of the spinal cord at the T11 level. Elsewhere, no definite lesions are identified within the thoracic spinal cord, although motion degradation significantly limits evaluation. Severe motion degradation of the post-contrast imaging precludes evaluation for pathologic spinal cord enhancement. 3. Thoracic spondylosis, as outlined. No significant spinal canal or foraminal stenosis. Electronically Signed   By: Kellie Simmering D.O.   On: 03/20/2022 14:22   MR Cervical Spine W or Wo Contrast  Result Date: 03/20/2022 CLINICAL DATA:  Provided history: Multiple sclerosis. MS flare up. EXAM: MRI HEAD WITHOUT AND WITH CONTRAST MRI CERVICAL SPINE WITHOUT AND WITH CONTRAST MRI THORACIC SPINE WITHOUT AND WITH CONTRAST CONTRAST:  75m GADAVIST GADOBUTROL 1 MMOL/ML IV SOLN TECHNIQUE: Multiplanar, multiecho pulse sequences of the brain and surrounding structures, and cervical and thoracic spine were obtained without and with intravenous contrast. COMPARISON:  Prior brain MRI examinations 02/08/2020 and earlier. Cervical spine MRI 03/14/2014. FINDINGS: MRI HEAD FINDINGS The examination is intermittently motion degraded. Most notably, there is severe motion degradation of the pre-contrast axial T1-weighted sequence and moderate-to-severe motion degradation of the coronal T2 sequence. Brain: Age advanced parenchymal atrophy. Severe patchy and confluent T2 FLAIR hyperintense signal abnormality within the cerebral white matter. Diffuse T2 FLAIR hyperintense signal abnormality and atrophy of the corpus callosum.  13 x 8 mm peripherally enhancing lesion within the posterior right temporal lobe white matter compatible with a site of active demyelination. 3 mm focus of enhancement within the posterior right frontal lobe white matter, likely reflecting an additional site of active demyelination (series 21, image 15) (series 20, image 37). No new white matter lesion is identified elsewhere. There is no acute infarct. No evidence of an intracranial mass. No chronic intracranial blood products. No extra-axial fluid collection. No midline shift. Vascular: Maintained flow voids within the proximal large arterial vessels. Skull and upper cervical spine: No focal suspicious marrow lesion. Sinuses/Orbits: No mass or acute finding within the imaged orbits. 2 cm mucous retention cyst within the left maxillary sinus. Mild mucosal thickening within the left frontal sinus. MRI CERVICAL SPINE FINDINGS Intermittently motion degraded examination, limiting evaluation. Most notably, there is moderate to severe motion degradation of the sagittal T2 sequence, moderate to severe motion degradation of the sagittal STIR sequence, moderate motion degradation of the axial T2-weighted sequences and moderate motion degradation of the sagittal T1-weighted post-contrast sequence. Alignment: Reversal of the expected cervical lordosis. No significant spondylolisthesis. Vertebrae: Vertebral body height is maintained. Within the limitations of motion degradation, no significant marrow edema or focal suspicious osseous lesion is identified. Degenerative  endplate irregularity at C5-C6. Cord: Small T2 hyperintense lesions within the posterior aspect of the spinal cord at the C2-C3 level, unchanged from the prior examination of 03/14/2014. No other definite lesions are identified within the cervical spinal cord, although motion degradation significantly limits evaluation. Within the limitations of motion degradation, no definite pathologic enhancement of the  cervical spinal cord is appreciated. Posterior Fossa, vertebral arteries, paraspinal tissues: Posterior fossa better assessed on concurrently performed brain MRI. Flow voids preserved within the imaged cervical vertebral arteries. No paraspinal mass or collection. Disc levels: Unless otherwise stated, the level by level findings below have not significantly changed from the prior MRI of 03/14/2014. Progressive disc degeneration. Most notably, progressive mild-to-moderate disc degeneration is present at C5-C6. C2-C3: Slight disc bulge, new from the prior MRI. No significant spinal canal or foraminal stenosis. C3-C4: No significant disc herniation or stenosis. C4-C5: No significant disc herniation or stenosis. C5-C6: Disc bulge with endplate spurring and bilateral uncovertebral hypertrophy, progressed. The disc bulge mildly effaces the ventral thecal sac and may contact the ventral aspect of the spinal cord. Progressive bilateral neural foraminal narrowing (mild right, mild to moderate left). C6-C7: Shallow disc bulge, new from the prior MRI. Mild effacement of the ventral thecal sac (without spinal cord mass effect). No significant foraminal stenosis. C7-T1: No significant disc herniation or stenosis. MRI THORACIC SPINE FINDINGS The examination is intermittently motion degraded, limiting evaluation. Most notably, the sagittal T2 sequence is mild to moderately motion degraded, the axial T2 weighted sequences are moderately motion degraded, the sagittal T1 weighted post-contrast sequence is severely motion degraded and the axial T1 weighted post-contrast sequence is severely motion degraded. Alignment:  No significant spondylolisthesis. Vertebrae: Vertebral body height is maintained. No significant marrow edema or focal suspicious osseous lesion. Cord: Short segment T2 hyperintense lesions (versus artifact) within the left aspect of the spinal cord at the T3 level (series 6, image 9) and within the anterior and right  aspect of the spinal cord at the T11 level (series 6, image 33). No definite lesions are identified elsewhere within the spinal cord at the thoracic levels, although motion degradation significantly limits evaluation. Severely motion degraded postcontrast imaging, precluding evaluation for pathologic spinal cord enhancement at the thoracic levels. Paraspinal and other soft tissues: No abnormality is identified within included portions of the thorax or upper abdomen/retroperitoneum. No paraspinal mass or collection. Disc levels: No more than mild disc degeneration within the thoracic spine. Small multilevel disc bulges. No focal disc herniation, significant spinal canal stenosis or neural foraminal narrowing. IMPRESSION: MRI brain: 1. Significant intermittent motion degradation. 2. Extensive T2 FLAIR hyperintense signal abnormality within the supratentorial white matter, compatible with the provided history of multiple sclerosis. 13 x 8 mm peripherally enhancing lesion within the posterior right temporal lobe white matter, consistent with a site of active demyelination. 3 mm focus of enhancement within the posterior right frontal lobe white matter, likely reflecting an additional site of active demyelination. No other definitively new lesions are identified. 3. Age advanced parenchymal atrophy. 4. Paranasal sinus disease, as described. MRI cervical spine: 1. Significantly motion degraded examination. 2. Small T2 hyperintense lesions within the posterior aspect of the spinal cord at the C2-C3 level, unchanged from the prior MRI of 03/14/2014. No definite lesions are identified elsewhere within the cervical spinal cord, although motion degradation significantly limits evaluation. No appreciable pathologic spinal cord enhancement. 3. Progressive cervical spondylosis, greatest at C5-C6. MRI thoracic spine: 1. Significantly motion degraded examination. 2. Short-segment T2 hyperintense lesions (versus artifact)  within the  left aspect of the spinal cord at the T3 level, and within the ventral and right aspect of the spinal cord at the T11 level. Elsewhere, no definite lesions are identified within the thoracic spinal cord, although motion degradation significantly limits evaluation. Severe motion degradation of the post-contrast imaging precludes evaluation for pathologic spinal cord enhancement. 3. Thoracic spondylosis, as outlined. No significant spinal canal or foraminal stenosis. Electronically Signed   By: Kellie Simmering D.O.   On: 03/20/2022 14:22   MR THORACIC SPINE W WO CONTRAST  Result Date: 03/20/2022 CLINICAL DATA:  Provided history: Multiple sclerosis. MS flare up. EXAM: MRI HEAD WITHOUT AND WITH CONTRAST MRI CERVICAL SPINE WITHOUT AND WITH CONTRAST MRI THORACIC SPINE WITHOUT AND WITH CONTRAST CONTRAST:  49m GADAVIST GADOBUTROL 1 MMOL/ML IV SOLN TECHNIQUE: Multiplanar, multiecho pulse sequences of the brain and surrounding structures, and cervical and thoracic spine were obtained without and with intravenous contrast. COMPARISON:  Prior brain MRI examinations 02/08/2020 and earlier. Cervical spine MRI 03/14/2014. FINDINGS: MRI HEAD FINDINGS The examination is intermittently motion degraded. Most notably, there is severe motion degradation of the pre-contrast axial T1-weighted sequence and moderate-to-severe motion degradation of the coronal T2 sequence. Brain: Age advanced parenchymal atrophy. Severe patchy and confluent T2 FLAIR hyperintense signal abnormality within the cerebral white matter. Diffuse T2 FLAIR hyperintense signal abnormality and atrophy of the corpus callosum. 13 x 8 mm peripherally enhancing lesion within the posterior right temporal lobe white matter compatible with a site of active demyelination. 3 mm focus of enhancement within the posterior right frontal lobe white matter, likely reflecting an additional site of active demyelination (series 21, image 15) (series 20, image 37). No new white matter  lesion is identified elsewhere. There is no acute infarct. No evidence of an intracranial mass. No chronic intracranial blood products. No extra-axial fluid collection. No midline shift. Vascular: Maintained flow voids within the proximal large arterial vessels. Skull and upper cervical spine: No focal suspicious marrow lesion. Sinuses/Orbits: No mass or acute finding within the imaged orbits. 2 cm mucous retention cyst within the left maxillary sinus. Mild mucosal thickening within the left frontal sinus. MRI CERVICAL SPINE FINDINGS Intermittently motion degraded examination, limiting evaluation. Most notably, there is moderate to severe motion degradation of the sagittal T2 sequence, moderate to severe motion degradation of the sagittal STIR sequence, moderate motion degradation of the axial T2-weighted sequences and moderate motion degradation of the sagittal T1-weighted post-contrast sequence. Alignment: Reversal of the expected cervical lordosis. No significant spondylolisthesis. Vertebrae: Vertebral body height is maintained. Within the limitations of motion degradation, no significant marrow edema or focal suspicious osseous lesion is identified. Degenerative endplate irregularity at C5-C6. Cord: Small T2 hyperintense lesions within the posterior aspect of the spinal cord at the C2-C3 level, unchanged from the prior examination of 03/14/2014. No other definite lesions are identified within the cervical spinal cord, although motion degradation significantly limits evaluation. Within the limitations of motion degradation, no definite pathologic enhancement of the cervical spinal cord is appreciated. Posterior Fossa, vertebral arteries, paraspinal tissues: Posterior fossa better assessed on concurrently performed brain MRI. Flow voids preserved within the imaged cervical vertebral arteries. No paraspinal mass or collection. Disc levels: Unless otherwise stated, the level by level findings below have not  significantly changed from the prior MRI of 03/14/2014. Progressive disc degeneration. Most notably, progressive mild-to-moderate disc degeneration is present at C5-C6. C2-C3: Slight disc bulge, new from the prior MRI. No significant spinal canal or foraminal stenosis. C3-C4: No significant disc herniation  or stenosis. C4-C5: No significant disc herniation or stenosis. C5-C6: Disc bulge with endplate spurring and bilateral uncovertebral hypertrophy, progressed. The disc bulge mildly effaces the ventral thecal sac and may contact the ventral aspect of the spinal cord. Progressive bilateral neural foraminal narrowing (mild right, mild to moderate left). C6-C7: Shallow disc bulge, new from the prior MRI. Mild effacement of the ventral thecal sac (without spinal cord mass effect). No significant foraminal stenosis. C7-T1: No significant disc herniation or stenosis. MRI THORACIC SPINE FINDINGS The examination is intermittently motion degraded, limiting evaluation. Most notably, the sagittal T2 sequence is mild to moderately motion degraded, the axial T2 weighted sequences are moderately motion degraded, the sagittal T1 weighted post-contrast sequence is severely motion degraded and the axial T1 weighted post-contrast sequence is severely motion degraded. Alignment:  No significant spondylolisthesis. Vertebrae: Vertebral body height is maintained. No significant marrow edema or focal suspicious osseous lesion. Cord: Short segment T2 hyperintense lesions (versus artifact) within the left aspect of the spinal cord at the T3 level (series 6, image 9) and within the anterior and right aspect of the spinal cord at the T11 level (series 6, image 33). No definite lesions are identified elsewhere within the spinal cord at the thoracic levels, although motion degradation significantly limits evaluation. Severely motion degraded postcontrast imaging, precluding evaluation for pathologic spinal cord enhancement at the thoracic  levels. Paraspinal and other soft tissues: No abnormality is identified within included portions of the thorax or upper abdomen/retroperitoneum. No paraspinal mass or collection. Disc levels: No more than mild disc degeneration within the thoracic spine. Small multilevel disc bulges. No focal disc herniation, significant spinal canal stenosis or neural foraminal narrowing. IMPRESSION: MRI brain: 1. Significant intermittent motion degradation. 2. Extensive T2 FLAIR hyperintense signal abnormality within the supratentorial white matter, compatible with the provided history of multiple sclerosis. 13 x 8 mm peripherally enhancing lesion within the posterior right temporal lobe white matter, consistent with a site of active demyelination. 3 mm focus of enhancement within the posterior right frontal lobe white matter, likely reflecting an additional site of active demyelination. No other definitively new lesions are identified. 3. Age advanced parenchymal atrophy. 4. Paranasal sinus disease, as described. MRI cervical spine: 1. Significantly motion degraded examination. 2. Small T2 hyperintense lesions within the posterior aspect of the spinal cord at the C2-C3 level, unchanged from the prior MRI of 03/14/2014. No definite lesions are identified elsewhere within the cervical spinal cord, although motion degradation significantly limits evaluation. No appreciable pathologic spinal cord enhancement. 3. Progressive cervical spondylosis, greatest at C5-C6. MRI thoracic spine: 1. Significantly motion degraded examination. 2. Short-segment T2 hyperintense lesions (versus artifact) within the left aspect of the spinal cord at the T3 level, and within the ventral and right aspect of the spinal cord at the T11 level. Elsewhere, no definite lesions are identified within the thoracic spinal cord, although motion degradation significantly limits evaluation. Severe motion degradation of the post-contrast imaging precludes evaluation  for pathologic spinal cord enhancement. 3. Thoracic spondylosis, as outlined. No significant spinal canal or foraminal stenosis. Electronically Signed   By: Kellie Simmering D.O.   On: 03/20/2022 14:22   DG Hip Unilat With Pelvis 2-3 Views Right  Result Date: 03/20/2022 CLINICAL DATA:  Fall yesterday with right hip pain. EXAM: DG HIP (WITH OR WITHOUT PELVIS) 2-3V RIGHT COMPARISON:  None Available. FINDINGS: There is no acute fracture or dislocation. Femoroacetabular alignment is normal. The SI joints and symphysis pubis are intact. The soft tissues are unremarkable. IMPRESSION:  No acute fracture or dislocation. Electronically Signed   By: Valetta Mole M.D.   On: 03/20/2022 11:59    Procedures Procedures    Medications Ordered in ED Medications  methylPREDNISolone sodium succinate (SOLU-MEDROL) 1,000 mg in sodium chloride 0.9 % 50 mL IVPB (has no administration in time range)  fentaNYL (SUBLIMAZE) injection 50 mcg (50 mcg Intravenous Given 03/20/22 1119)  gadobutrol (GADAVIST) 1 MMOL/ML injection 9 mL (9 mLs Intravenous Contrast Given 03/20/22 1337)  morphine (PF) 4 MG/ML injection 4 mg (4 mg Intravenous Given 03/20/22 1551)    ED Course/ Medical Decision Making/ A&P Clinical Course as of 03/20/22 1613  Fri Mar 20, 2022  1209 Spoke to Dr Curly Shores with neurology. She recommend MR brain, c-spine, and T-spine. If contrast enhancing lesion is seen, pt can likely be managed at [facility]. If not, may need transfer to [facility]. Will message her when results return. [EC]    Clinical Course User Index [EC] Tonye Pearson, PA-C                           Medical Decision Making Amount and/or Complexity of Data Reviewed Labs: ordered. Radiology: ordered.  Risk Prescription drug management. Decision regarding hospitalization.   Social determinants of health:  Social History   Socioeconomic History   Marital status: Divorced    Spouse name: Not on file   Number of children: Not on file   Years of education:  Not on file   Highest education level: Not on file  Occupational History   Not on file  Tobacco Use   Smoking status: Former    Types: Cigarettes   Smokeless tobacco: Never  Substance and Sexual Activity   Alcohol use: No    Alcohol/week: 0.0 standard drinks of alcohol   Drug use: No   Sexual activity: Not on file  Other Topics Concern   Not on file  Social History Narrative   Not on file   Social Determinants of Health   Financial Resource Strain: Not on file  Food Insecurity: Not on file  Transportation Needs: Not on file  Physical Activity: Not on file  Stress: Not on file  Social Connections: Not on file  Intimate Partner Violence: Not on file     Initial impression:  This patient presents to the ED for concern of suspected MS flare, this involves an extensive number of treatment options, and is a complaint that carries with it a high risk of complications and morbidity.   Differentials include MS flareup, CVA, injury due to fall,   Comorbidities affecting care:  MS  Additional history obtained: Previous neurology records.  Most recent MRI brain in 2021  Lab Tests  I Ordered, reviewed, and interpreted labs and EKG.  The pertinent results include:  CBC and BMP unremarkable Pending UA  Imaging Studies ordered:  I ordered imaging studies including  X-ray right unilateral hip without fracture or dislocation MRI brain, C-spine and T-spine notable for contrast-enhancing lesions consistent with an MS flare I independently visualized and interpreted imaging and I agree with the radiologist interpretation.    Cardiac Monitoring:  The patient was maintained on a cardiac monitor.  I personally viewed and interpreted the cardiac monitored which showed an underlying rhythm of: NSR   Medicines ordered and prescription drug management:  I ordered medication including: Fentanyl 50 mcg IV Solu-Medrol 1000 mg Morphine 4 mg Reevaluation of the patient after these  medicines showed that the patient  improved I have reviewed the patients home medicines and have made adjustments as needed   Consultations Obtained:  I requested consultation with neurology and spoke with Dr. Curly Shores,  and discussed lab and imaging findings as well as pertinent plan - they recommend: Obtaining MRI brain, C-spine and T-spine to confirm that there are contrast-enhancing lesions consistent with MS flareup.  If this is positive, she can likely be managed at [facility] for IV steroid therapy, if not then she should be transferred to [facility] for neurologic work-up and evaluation.  I will contact her when these return. MRI brain, C-spine and T-spine with contrast enhancing lesions.  I spoke with Dr. Curly Shores who thinks that patient should be admitted to [facility] for pulse dose steroids at 1 g Solu-Medrol for 3 to 5 days.  She recommends consulting with Dr. Hortense Ramal on Monday to evaluate whether she needs a full 5-day course. Please see her note for full recommendations.   ED Course/Re-evaluation: Presents in no acute distress and is nontoxic-appearing.  Vitals without significant abnormality.  On exam, she has lower extremity weakness worse on the right side.  She is unable to lift leg off the table against gravity.  Subjective sensation appears intact bilaterally.  Strong and equal grip strength.  She appears acutely uncomfortable and is rubbing her legs complaining about her severe pain.  Although she insists her pain is not secondary to the fall that she experienced yesterday and that her flareup began a couple of weeks ago, I did order a x-ray of the right hip to ensure this is not because of her pain and weakness which was negative.  Labs were normal.  Consulted with neurology with recommendations as described above.  Ultimately, patient was admitted to [facility] by Dr. Waldron Labs and her first dose of IV steroids was given here in the emergency department.  Pain was managed with morphine 4 mg IV.   Her first dose of fentanyl 50 mcg did not have therapeutic effect. Pending urinalysis at time of admission.   Disposition:  After consideration of the diagnostic results, physical exam, history and the patients response to treatment feel that the patent would benefit from admission.   Multiple sclerosis: Plan and management as described above.  Final Clinical Impression(s) / ED Diagnoses Final diagnoses:  Multiple sclerosis [facility])    Rx / DC Orders ED Discharge Orders     None         Rodena Piety 03/20/22 1614    Carmin Muskrat, MD 03/26/22 [phone]

## 2022-03-20 NOTE — Progress Notes (Addendum)
I was curbsided by PA Corky Sox about this patient with history of multiple sclerosis p/w c/f flare.  Unfortunately unable to review any neurology records that she gets her care through a practice whose records are not accessible on our EMR.  She is postmenopausal and on Tecfidera.  Reportedly she has been having 2 weeks of gradually worsening leg weakness in the right leg more than the left, wheelchair-bound at baseline secondary to her multiple sclerosis  I was able to reach to patient's pharmacy at [phone] ([facility] [place]), and confirmed that the patient had taken 2 rounds of steroids per her PCP Dr. Hilma Favors.  Starting dose at 50 mg, tapering by 10 mg every 3 days for a 15-day course.  She picked up the first prescription on 7/6 and the second on 7/31  Initially recommend MRI brain, C-spine, T-spine with and without contrast If a clear new enhancing culprit lesion for her symptoms is identified, she may be able to be admitted to [facility] for a more standard course of pulse dose steroids (1 g Solu-Medrol IV daily for 3 to 5 days rather than lower doses she has already received.  However if the imaging is negative or atypical, transfer to [facility] for full neurology evaluation is more appropriate.  I will follow-up images  No charge plan of care note  Lesleigh Noe MD-PhD [facility] [phone] [facility] coverage for [facility] is from 8 AM to 4 AM in-house and 4 PM to 8 PM by telephone/video. 8 PM to 8 AM emergent questions or overnight urgent questions should be addressed to Teleneurology On-call or [facility] neurohospitalist; contact information can be found on AMION  Addendum 3:36 PM  Imaging personally reviewed, agree with radiology:    MRI brain:   1. Significant intermittent motion degradation. 2. Extensive T2 FLAIR hyperintense signal abnormality within the supratentorial white matter, compatible with the  provided history of multiple sclerosis. 13 x 8 mm peripherally enhancing lesion within the posterior right temporal lobe white matter, consistent with a site of active demyelination. 3 mm focus of enhancement within the posterior right frontal lobe white matter, likely reflecting an additional site of active demyelination. No other definitively new lesions are identified. 3. Age advanced parenchymal atrophy. 4. Paranasal sinus disease, as described.   MRI cervical spine:   1. Significantly motion degraded examination. 2. Small T2 hyperintense lesions within the posterior aspect of the spinal cord at the C2-C3 level, unchanged from the prior MRI of 03/14/2014. No definite lesions are identified elsewhere within the cervical spinal cord, although motion degradation significantly limits evaluation. No appreciable pathologic spinal cord enhancement. 3. Progressive cervical spondylosis, greatest at C5-C6.   MRI thoracic spine:   1. Significantly motion degraded examination. 2. Short-segment T2 hyperintense lesions (versus artifact) within the left aspect of the spinal cord at the T3 level, and within the ventral and right aspect of the spinal cord at the T11 level. Elsewhere, no definite lesions are identified within the thoracic spinal cord, although motion degradation significantly limits evaluation. Severe motion degradation of the post-contrast imaging precludes evaluation for pathologic spinal cord enhancement. 3. Thoracic spondylosis, as outlined. No significant spinal canal or foraminal stenosis.  Given multiple enhancing lesions, I do think that the patient merits admission for pulse dose steroids to speed recovery and halt this MS flare, with primary team to confirm that there are no significant infectious concerns prior to admission steroids.  Recommendations -1 g Solu-Medrol for 3 to 5  days (please consult Dr. Hortense Ramal on Monday to evaluate whether a full 5-day course is  needed); staff message has been sent by myself -PPI while on pulse dose steroids -Prandial glucose checks while on pulse dose steroids due to steroid associated hyperglycemia -DVT prophylaxis -PT/OT -Management of comorbidities per primary team  This is a brief curbside note and does not replace a full neurology consultation Lesleigh Noe MD-PhD [facility] [phone]

## 2022-03-20 NOTE — ED Triage Notes (Signed)
Pt BIB [facility] c/o for MS flare up. Home health aid called. Pt states she fell down yesterday due to an "episode". Pt denies being able to feel legs or walk (wheelchair bound). Pt recently had steroid shot and rx, does not seem to help. PA at bedside to assess.

## 2022-03-20 NOTE — H&P (Signed)
TRH H&P   Patient Demographics:    Erin Berg, is a 56 y.o. female  MRN: [id number]   DOB - [birth year]  Admit Date - 03/20/2022  Outpatient Primary MD for the patient is Redmond School, MD  Referring MD/NP/PA: PA Conklin  Outpatient Specialists: neurology Dr Merril Abbe    Patient coming from: home  Chief Complaint  Patient presents with   Leg Pain      HPI:    Erin Berg  is a 56 y.o. female, with past medical history of MS, hyperlipidemia, presents to ED secondary to suspicion for MS flare, she presents mainly today with lower extremity pain, but upon further questioning does appear she has been having progressive weakness over the last 2 to 3 weeks, she is wheelchair dependent, but able to ambulate from bed to chair, but this has been progressively weak over the last few weeks, as well she had multiple falls recently per her father at bedside, she denies nausea, vomiting, fever, chills, polyuria or dysuria, she denies any double vision, blurry vision or headache. -In ED labs were significant for potassium of 3.2, her MRI brain/cervical/thoracic spine were significant for multiple enhancing lesion, neurology recommended admission for pulse steroids, [facility] hospitalist consulted to admit    Review of systems:      A full 10 point Review of Systems was done, except as stated above, all other Review of Systems were negative.   With Past History of the following :    Past Medical History:  Diagnosis Date   Anxiety and depression    BMI 24.0-24.9, adult    MS (multiple sclerosis) ([place])    Multiple sclerosis ([place])       Past Surgical History:  Procedure Laterality Date   COLONOSCOPY WITH PROPOFOL N/A 12/22/2021   Procedure: COLONOSCOPY WITH PROPOFOL;  Surgeon: Daneil Dolin, MD;  Location: [facility];  Service: Endoscopy;  Laterality: N/A;  12:30 / ASA 2    TONSILLECTOMY AND ADENOIDECTOMY        Social History:     Social History   Tobacco Use   Smoking status: Former    Types: Cigarettes   Smokeless tobacco: Never  Substance Use Topics   Alcohol use: No    Alcohol/week: 0.0 standard drinks of alcohol     Lives -she lives at home with her son  Mobility -wheelchair dependent     Family History :     Family History  Problem Relation Age of Onset   Breast cancer Mother    Colon cancer Maternal Grandmother    Breast cancer Paternal Grandmother       Home Medications:   Prior to Admission medications   Medication Sig Start Date End Date Taking? Authorizing Provider  acetaminophen (TYLENOL) 500 MG tablet Take 1,000 mg by mouth every 6 (six) hours as needed for moderate pain.   Yes [provider]  dalfampridine 10 MG TB12 Take 1 tablet by mouth 2 (two) times daily. 08/20/21  Yes [provider]  FLUoxetine (PROZAC) 20 MG capsule Take 20 mg by mouth daily. 12/19/21  Yes [provider]  gabapentin (NEURONTIN) 300 MG capsule Take 300 mg by mouth 2 (two) times daily.   Yes [provider]  predniSONE (DELTASONE) 10 MG tablet Take 4 tablets by mouth daily. 4tabs x 3days,3tabs x3days, 2tabs x3days 1tab 03/16/22  Yes [provider]  rosuvastatin (CRESTOR) 5 MG tablet Take 5 mg by mouth at bedtime.   Yes [provider]  TECFIDERA 240 MG CPDR Take 240 mg by mouth 2 (two) times daily. 03/25/15  Yes [provider]  OVER THE COUNTER MEDICATION Take 1 tablet by mouth daily. [place]    [provider]     Allergies:     Allergies  Allergen Reactions   Sulfamethoxazole-Trimethoprim Hives     Physical Exam:   Vitals  Blood pressure (!) 145/87, pulse 91, temperature 98.7 F (37.1 C), temperature source Oral, resp. rate 18, last menstrual period 08/01/2019, SpO2 95 %.   1. General well-developed female, laying in bed, no apparent distress  2.  Normal affect and insight, Not Suicidal or Homicidal, Awake Alert, Oriented X 3.  3. No F.N deficits, ALL C.Nerves Intact, patient with bilateral lower extremity weakness .  4. Ears and Eyes appear Normal, Conjunctivae clear, PERRLA. Moist Oral Mucosa.  5. Supple Neck, No JVD, No cervical lymphadenopathy appriciated, No Carotid Bruits.  6. Symmetrical Chest wall movement, Good air movement bilaterally, CTAB.  7. RRR, No Gallops, Rubs or Murmurs, No Parasternal Heave.  8. Positive Bowel Sounds, Abdomen Soft, No tenderness, No organomegaly appriciated,No rebound -guarding or rigidity.  9.  No Cyanosis, Normal Skin Turgor, is with lower extremity bruising due to falls  10. Good muscle tone,  joints appear normal , no effusions, Normal ROM.  11. No Palpable Lymph Nodes in Neck or Axillae     Data Review:    CBC Recent Labs  Lab 03/20/22 1111  WBC 7.0  HGB 13.7  HCT 42.3  PLT 249  MCV 83.1  MCH 26.9  MCHC 32.4  RDW 14.8  LYMPHSABS 1.3  MONOABS 0.5  EOSABS 0.0  BASOSABS 0.0   ------------------------------------------------------------------------------------------------------------------  Chemistries  Recent Labs  Lab 03/20/22 1111  NA 141  K 3.2*  CL 106  CO2 27  GLUCOSE 97  BUN 19  CREATININE 0.52  CALCIUM 9.6   ------------------------------------------------------------------------------------------------------------------ CrCl cannot be calculated (Unknown ideal weight.). ------------------------------------------------------------------------------------------------------------------ No results for input(s): "TSH", "T4TOTAL", "T3FREE", "THYROIDAB" in the last 72 hours.  Invalid input(s): "FREET3"  Coagulation profile No results for input(s): "INR", "PROTIME" in the last 168 hours. ------------------------------------------------------------------------------------------------------------------- No results for input(s): "DDIMER" in the last 72  hours. -------------------------------------------------------------------------------------------------------------------  Cardiac Enzymes No results for input(s): "CKMB", "TROPONINI", "MYOGLOBIN" in the last 168 hours.  Invalid input(s): "CK" ------------------------------------------------------------------------------------------------------------------ No results found for: "BNP"   ---------------------------------------------------------------------------------------------------------------  Urinalysis    Component Value Date/Time   COLORURINE YELLOW 03/20/2022 1524   APPEARANCEUR HAZY (A) 03/20/2022 1524   LABSPEC 1.030 03/20/2022 1524   PHURINE 5.0 03/20/2022 1524   GLUCOSEU NEGATIVE 03/20/2022 1524   HGBUR MODERATE (A) 03/20/2022 1524   BILIRUBINUR NEGATIVE 03/20/2022 1524   KETONESUR 80 (A) 03/20/2022 1524   PROTEINUR NEGATIVE 03/20/2022 1524   NITRITE POSITIVE (A) 03/20/2022 1524   LEUKOCYTESUR NEGATIVE 03/20/2022 1524    ----------------------------------------------------------------------------------------------------------------   Imaging Results:  MR Brain W and Wo Contrast  Result Date: 03/20/2022 CLINICAL DATA:  Provided history: Multiple sclerosis. MS flare up. EXAM: MRI HEAD WITHOUT AND WITH CONTRAST MRI CERVICAL SPINE WITHOUT AND WITH CONTRAST MRI THORACIC SPINE WITHOUT AND WITH CONTRAST CONTRAST:  80m GADAVIST GADOBUTROL 1 MMOL/ML IV SOLN TECHNIQUE: Multiplanar, multiecho pulse sequences of the brain and surrounding structures, and cervical and thoracic spine were obtained without and with intravenous contrast. COMPARISON:  Prior brain MRI examinations 02/08/2020 and earlier. Cervical spine MRI 03/14/2014. FINDINGS: MRI HEAD FINDINGS The examination is intermittently motion degraded. Most notably, there is severe motion degradation of the pre-contrast axial T1-weighted sequence and moderate-to-severe motion degradation of the coronal T2 sequence. Brain: Age  advanced parenchymal atrophy. Severe patchy and confluent T2 FLAIR hyperintense signal abnormality within the cerebral white matter. Diffuse T2 FLAIR hyperintense signal abnormality and atrophy of the corpus callosum. 13 x 8 mm peripherally enhancing lesion within the posterior right temporal lobe white matter compatible with a site of active demyelination. 3 mm focus of enhancement within the posterior right frontal lobe white matter, likely reflecting an additional site of active demyelination (series 21, image 15) (series 20, image 37). No new white matter lesion is identified elsewhere. There is no acute infarct. No evidence of an intracranial mass. No chronic intracranial blood products. No extra-axial fluid collection. No midline shift. Vascular: Maintained flow voids within the proximal large arterial vessels. Skull and upper cervical spine: No focal suspicious marrow lesion. Sinuses/Orbits: No mass or acute finding within the imaged orbits. 2 cm mucous retention cyst within the left maxillary sinus. Mild mucosal thickening within the left frontal sinus. MRI CERVICAL SPINE FINDINGS Intermittently motion degraded examination, limiting evaluation. Most notably, there is moderate to severe motion degradation of the sagittal T2 sequence, moderate to severe motion degradation of the sagittal STIR sequence, moderate motion degradation of the axial T2-weighted sequences and moderate motion degradation of the sagittal T1-weighted post-contrast sequence. Alignment: Reversal of the expected cervical lordosis. No significant spondylolisthesis. Vertebrae: Vertebral body height is maintained. Within the limitations of motion degradation, no significant marrow edema or focal suspicious osseous lesion is identified. Degenerative endplate irregularity at C5-C6. Cord: Small T2 hyperintense lesions within the posterior aspect of the spinal cord at the C2-C3 level, unchanged from the prior examination of 03/14/2014. No other  definite lesions are identified within the cervical spinal cord, although motion degradation significantly limits evaluation. Within the limitations of motion degradation, no definite pathologic enhancement of the cervical spinal cord is appreciated. Posterior Fossa, vertebral arteries, paraspinal tissues: Posterior fossa better assessed on concurrently performed brain MRI. Flow voids preserved within the imaged cervical vertebral arteries. No paraspinal mass or collection. Disc levels: Unless otherwise stated, the level by level findings below have not significantly changed from the prior MRI of 03/14/2014. Progressive disc degeneration. Most notably, progressive mild-to-moderate disc degeneration is present at C5-C6. C2-C3: Slight disc bulge, new from the prior MRI. No significant spinal canal or foraminal stenosis. C3-C4: No significant disc herniation or stenosis. C4-C5: No significant disc herniation or stenosis. C5-C6: Disc bulge with endplate spurring and bilateral uncovertebral hypertrophy, progressed. The disc bulge mildly effaces the ventral thecal sac and may contact the ventral aspect of the spinal cord. Progressive bilateral neural foraminal narrowing (mild right, mild to moderate left). C6-C7: Shallow disc bulge, new from the prior MRI. Mild effacement of the ventral thecal sac (without spinal cord mass effect). No significant foraminal stenosis. C7-T1: No significant disc herniation or stenosis. MRI THORACIC SPINE FINDINGS The  examination is intermittently motion degraded, limiting evaluation. Most notably, the sagittal T2 sequence is mild to moderately motion degraded, the axial T2 weighted sequences are moderately motion degraded, the sagittal T1 weighted post-contrast sequence is severely motion degraded and the axial T1 weighted post-contrast sequence is severely motion degraded. Alignment:  No significant spondylolisthesis. Vertebrae: Vertebral body height is maintained. No significant marrow  edema or focal suspicious osseous lesion. Cord: Short segment T2 hyperintense lesions (versus artifact) within the left aspect of the spinal cord at the T3 level (series 6, image 9) and within the anterior and right aspect of the spinal cord at the T11 level (series 6, image 33). No definite lesions are identified elsewhere within the spinal cord at the thoracic levels, although motion degradation significantly limits evaluation. Severely motion degraded postcontrast imaging, precluding evaluation for pathologic spinal cord enhancement at the thoracic levels. Paraspinal and other soft tissues: No abnormality is identified within included portions of the thorax or upper abdomen/retroperitoneum. No paraspinal mass or collection. Disc levels: No more than mild disc degeneration within the thoracic spine. Small multilevel disc bulges. No focal disc herniation, significant spinal canal stenosis or neural foraminal narrowing. IMPRESSION: MRI brain: 1. Significant intermittent motion degradation. 2. Extensive T2 FLAIR hyperintense signal abnormality within the supratentorial white matter, compatible with the provided history of multiple sclerosis. 13 x 8 mm peripherally enhancing lesion within the posterior right temporal lobe white matter, consistent with a site of active demyelination. 3 mm focus of enhancement within the posterior right frontal lobe white matter, likely reflecting an additional site of active demyelination. No other definitively new lesions are identified. 3. Age advanced parenchymal atrophy. 4. Paranasal sinus disease, as described. MRI cervical spine: 1. Significantly motion degraded examination. 2. Small T2 hyperintense lesions within the posterior aspect of the spinal cord at the C2-C3 level, unchanged from the prior MRI of 03/14/2014. No definite lesions are identified elsewhere within the cervical spinal cord, although motion degradation significantly limits evaluation. No appreciable pathologic  spinal cord enhancement. 3. Progressive cervical spondylosis, greatest at C5-C6. MRI thoracic spine: 1. Significantly motion degraded examination. 2. Short-segment T2 hyperintense lesions (versus artifact) within the left aspect of the spinal cord at the T3 level, and within the ventral and right aspect of the spinal cord at the T11 level. Elsewhere, no definite lesions are identified within the thoracic spinal cord, although motion degradation significantly limits evaluation. Severe motion degradation of the post-contrast imaging precludes evaluation for pathologic spinal cord enhancement. 3. Thoracic spondylosis, as outlined. No significant spinal canal or foraminal stenosis. Electronically Signed   By: Kellie Simmering D.O.   On: 03/20/2022 14:22   MR Cervical Spine W or Wo Contrast  Result Date: 03/20/2022 CLINICAL DATA:  Provided history: Multiple sclerosis. MS flare up. EXAM: MRI HEAD WITHOUT AND WITH CONTRAST MRI CERVICAL SPINE WITHOUT AND WITH CONTRAST MRI THORACIC SPINE WITHOUT AND WITH CONTRAST CONTRAST:  55m GADAVIST GADOBUTROL 1 MMOL/ML IV SOLN TECHNIQUE: Multiplanar, multiecho pulse sequences of the brain and surrounding structures, and cervical and thoracic spine were obtained without and with intravenous contrast. COMPARISON:  Prior brain MRI examinations 02/08/2020 and earlier. Cervical spine MRI 03/14/2014. FINDINGS: MRI HEAD FINDINGS The examination is intermittently motion degraded. Most notably, there is severe motion degradation of the pre-contrast axial T1-weighted sequence and moderate-to-severe motion degradation of the coronal T2 sequence. Brain: Age advanced parenchymal atrophy. Severe patchy and confluent T2 FLAIR hyperintense signal abnormality within the cerebral white matter. Diffuse T2 FLAIR hyperintense signal abnormality and atrophy of  the corpus callosum. 13 x 8 mm peripherally enhancing lesion within the posterior right temporal lobe white matter compatible with a site of active  demyelination. 3 mm focus of enhancement within the posterior right frontal lobe white matter, likely reflecting an additional site of active demyelination (series 21, image 15) (series 20, image 37). No new white matter lesion is identified elsewhere. There is no acute infarct. No evidence of an intracranial mass. No chronic intracranial blood products. No extra-axial fluid collection. No midline shift. Vascular: Maintained flow voids within the proximal large arterial vessels. Skull and upper cervical spine: No focal suspicious marrow lesion. Sinuses/Orbits: No mass or acute finding within the imaged orbits. 2 cm mucous retention cyst within the left maxillary sinus. Mild mucosal thickening within the left frontal sinus. MRI CERVICAL SPINE FINDINGS Intermittently motion degraded examination, limiting evaluation. Most notably, there is moderate to severe motion degradation of the sagittal T2 sequence, moderate to severe motion degradation of the sagittal STIR sequence, moderate motion degradation of the axial T2-weighted sequences and moderate motion degradation of the sagittal T1-weighted post-contrast sequence. Alignment: Reversal of the expected cervical lordosis. No significant spondylolisthesis. Vertebrae: Vertebral body height is maintained. Within the limitations of motion degradation, no significant marrow edema or focal suspicious osseous lesion is identified. Degenerative endplate irregularity at C5-C6. Cord: Small T2 hyperintense lesions within the posterior aspect of the spinal cord at the C2-C3 level, unchanged from the prior examination of 03/14/2014. No other definite lesions are identified within the cervical spinal cord, although motion degradation significantly limits evaluation. Within the limitations of motion degradation, no definite pathologic enhancement of the cervical spinal cord is appreciated. Posterior Fossa, vertebral arteries, paraspinal tissues: Posterior fossa better assessed on  concurrently performed brain MRI. Flow voids preserved within the imaged cervical vertebral arteries. No paraspinal mass or collection. Disc levels: Unless otherwise stated, the level by level findings below have not significantly changed from the prior MRI of 03/14/2014. Progressive disc degeneration. Most notably, progressive mild-to-moderate disc degeneration is present at C5-C6. C2-C3: Slight disc bulge, new from the prior MRI. No significant spinal canal or foraminal stenosis. C3-C4: No significant disc herniation or stenosis. C4-C5: No significant disc herniation or stenosis. C5-C6: Disc bulge with endplate spurring and bilateral uncovertebral hypertrophy, progressed. The disc bulge mildly effaces the ventral thecal sac and may contact the ventral aspect of the spinal cord. Progressive bilateral neural foraminal narrowing (mild right, mild to moderate left). C6-C7: Shallow disc bulge, new from the prior MRI. Mild effacement of the ventral thecal sac (without spinal cord mass effect). No significant foraminal stenosis. C7-T1: No significant disc herniation or stenosis. MRI THORACIC SPINE FINDINGS The examination is intermittently motion degraded, limiting evaluation. Most notably, the sagittal T2 sequence is mild to moderately motion degraded, the axial T2 weighted sequences are moderately motion degraded, the sagittal T1 weighted post-contrast sequence is severely motion degraded and the axial T1 weighted post-contrast sequence is severely motion degraded. Alignment:  No significant spondylolisthesis. Vertebrae: Vertebral body height is maintained. No significant marrow edema or focal suspicious osseous lesion. Cord: Short segment T2 hyperintense lesions (versus artifact) within the left aspect of the spinal cord at the T3 level (series 6, image 9) and within the anterior and right aspect of the spinal cord at the T11 level (series 6, image 33). No definite lesions are identified elsewhere within the spinal  cord at the thoracic levels, although motion degradation significantly limits evaluation. Severely motion degraded postcontrast imaging, precluding evaluation for pathologic spinal cord enhancement at  the thoracic levels. Paraspinal and other soft tissues: No abnormality is identified within included portions of the thorax or upper abdomen/retroperitoneum. No paraspinal mass or collection. Disc levels: No more than mild disc degeneration within the thoracic spine. Small multilevel disc bulges. No focal disc herniation, significant spinal canal stenosis or neural foraminal narrowing. IMPRESSION: MRI brain: 1. Significant intermittent motion degradation. 2. Extensive T2 FLAIR hyperintense signal abnormality within the supratentorial white matter, compatible with the provided history of multiple sclerosis. 13 x 8 mm peripherally enhancing lesion within the posterior right temporal lobe white matter, consistent with a site of active demyelination. 3 mm focus of enhancement within the posterior right frontal lobe white matter, likely reflecting an additional site of active demyelination. No other definitively new lesions are identified. 3. Age advanced parenchymal atrophy. 4. Paranasal sinus disease, as described. MRI cervical spine: 1. Significantly motion degraded examination. 2. Small T2 hyperintense lesions within the posterior aspect of the spinal cord at the C2-C3 level, unchanged from the prior MRI of 03/14/2014. No definite lesions are identified elsewhere within the cervical spinal cord, although motion degradation significantly limits evaluation. No appreciable pathologic spinal cord enhancement. 3. Progressive cervical spondylosis, greatest at C5-C6. MRI thoracic spine: 1. Significantly motion degraded examination. 2. Short-segment T2 hyperintense lesions (versus artifact) within the left aspect of the spinal cord at the T3 level, and within the ventral and right aspect of the spinal cord at the T11 level.  Elsewhere, no definite lesions are identified within the thoracic spinal cord, although motion degradation significantly limits evaluation. Severe motion degradation of the post-contrast imaging precludes evaluation for pathologic spinal cord enhancement. 3. Thoracic spondylosis, as outlined. No significant spinal canal or foraminal stenosis. Electronically Signed   By: Kellie Simmering D.O.   On: 03/20/2022 14:22   MR THORACIC SPINE W WO CONTRAST  Result Date: 03/20/2022 CLINICAL DATA:  Provided history: Multiple sclerosis. MS flare up. EXAM: MRI HEAD WITHOUT AND WITH CONTRAST MRI CERVICAL SPINE WITHOUT AND WITH CONTRAST MRI THORACIC SPINE WITHOUT AND WITH CONTRAST CONTRAST:  45m GADAVIST GADOBUTROL 1 MMOL/ML IV SOLN TECHNIQUE: Multiplanar, multiecho pulse sequences of the brain and surrounding structures, and cervical and thoracic spine were obtained without and with intravenous contrast. COMPARISON:  Prior brain MRI examinations 02/08/2020 and earlier. Cervical spine MRI 03/14/2014. FINDINGS: MRI HEAD FINDINGS The examination is intermittently motion degraded. Most notably, there is severe motion degradation of the pre-contrast axial T1-weighted sequence and moderate-to-severe motion degradation of the coronal T2 sequence. Brain: Age advanced parenchymal atrophy. Severe patchy and confluent T2 FLAIR hyperintense signal abnormality within the cerebral white matter. Diffuse T2 FLAIR hyperintense signal abnormality and atrophy of the corpus callosum. 13 x 8 mm peripherally enhancing lesion within the posterior right temporal lobe white matter compatible with a site of active demyelination. 3 mm focus of enhancement within the posterior right frontal lobe white matter, likely reflecting an additional site of active demyelination (series 21, image 15) (series 20, image 37). No new white matter lesion is identified elsewhere. There is no acute infarct. No evidence of an intracranial mass. No chronic intracranial blood  products. No extra-axial fluid collection. No midline shift. Vascular: Maintained flow voids within the proximal large arterial vessels. Skull and upper cervical spine: No focal suspicious marrow lesion. Sinuses/Orbits: No mass or acute finding within the imaged orbits. 2 cm mucous retention cyst within the left maxillary sinus. Mild mucosal thickening within the left frontal sinus. MRI CERVICAL SPINE FINDINGS Intermittently motion degraded examination, limiting evaluation. Most notably, there  is moderate to severe motion degradation of the sagittal T2 sequence, moderate to severe motion degradation of the sagittal STIR sequence, moderate motion degradation of the axial T2-weighted sequences and moderate motion degradation of the sagittal T1-weighted post-contrast sequence. Alignment: Reversal of the expected cervical lordosis. No significant spondylolisthesis. Vertebrae: Vertebral body height is maintained. Within the limitations of motion degradation, no significant marrow edema or focal suspicious osseous lesion is identified. Degenerative endplate irregularity at C5-C6. Cord: Small T2 hyperintense lesions within the posterior aspect of the spinal cord at the C2-C3 level, unchanged from the prior examination of 03/14/2014. No other definite lesions are identified within the cervical spinal cord, although motion degradation significantly limits evaluation. Within the limitations of motion degradation, no definite pathologic enhancement of the cervical spinal cord is appreciated. Posterior Fossa, vertebral arteries, paraspinal tissues: Posterior fossa better assessed on concurrently performed brain MRI. Flow voids preserved within the imaged cervical vertebral arteries. No paraspinal mass or collection. Disc levels: Unless otherwise stated, the level by level findings below have not significantly changed from the prior MRI of 03/14/2014. Progressive disc degeneration. Most notably, progressive mild-to-moderate disc  degeneration is present at C5-C6. C2-C3: Slight disc bulge, new from the prior MRI. No significant spinal canal or foraminal stenosis. C3-C4: No significant disc herniation or stenosis. C4-C5: No significant disc herniation or stenosis. C5-C6: Disc bulge with endplate spurring and bilateral uncovertebral hypertrophy, progressed. The disc bulge mildly effaces the ventral thecal sac and may contact the ventral aspect of the spinal cord. Progressive bilateral neural foraminal narrowing (mild right, mild to moderate left). C6-C7: Shallow disc bulge, new from the prior MRI. Mild effacement of the ventral thecal sac (without spinal cord mass effect). No significant foraminal stenosis. C7-T1: No significant disc herniation or stenosis. MRI THORACIC SPINE FINDINGS The examination is intermittently motion degraded, limiting evaluation. Most notably, the sagittal T2 sequence is mild to moderately motion degraded, the axial T2 weighted sequences are moderately motion degraded, the sagittal T1 weighted post-contrast sequence is severely motion degraded and the axial T1 weighted post-contrast sequence is severely motion degraded. Alignment:  No significant spondylolisthesis. Vertebrae: Vertebral body height is maintained. No significant marrow edema or focal suspicious osseous lesion. Cord: Short segment T2 hyperintense lesions (versus artifact) within the left aspect of the spinal cord at the T3 level (series 6, image 9) and within the anterior and right aspect of the spinal cord at the T11 level (series 6, image 33). No definite lesions are identified elsewhere within the spinal cord at the thoracic levels, although motion degradation significantly limits evaluation. Severely motion degraded postcontrast imaging, precluding evaluation for pathologic spinal cord enhancement at the thoracic levels. Paraspinal and other soft tissues: No abnormality is identified within included portions of the thorax or upper  abdomen/retroperitoneum. No paraspinal mass or collection. Disc levels: No more than mild disc degeneration within the thoracic spine. Small multilevel disc bulges. No focal disc herniation, significant spinal canal stenosis or neural foraminal narrowing. IMPRESSION: MRI brain: 1. Significant intermittent motion degradation. 2. Extensive T2 FLAIR hyperintense signal abnormality within the supratentorial white matter, compatible with the provided history of multiple sclerosis. 13 x 8 mm peripherally enhancing lesion within the posterior right temporal lobe white matter, consistent with a site of active demyelination. 3 mm focus of enhancement within the posterior right frontal lobe white matter, likely reflecting an additional site of active demyelination. No other definitively new lesions are identified. 3. Age advanced parenchymal atrophy. 4. Paranasal sinus disease, as described. MRI cervical spine: 1.  Significantly motion degraded examination. 2. Small T2 hyperintense lesions within the posterior aspect of the spinal cord at the C2-C3 level, unchanged from the prior MRI of 03/14/2014. No definite lesions are identified elsewhere within the cervical spinal cord, although motion degradation significantly limits evaluation. No appreciable pathologic spinal cord enhancement. 3. Progressive cervical spondylosis, greatest at C5-C6. MRI thoracic spine: 1. Significantly motion degraded examination. 2. Short-segment T2 hyperintense lesions (versus artifact) within the left aspect of the spinal cord at the T3 level, and within the ventral and right aspect of the spinal cord at the T11 level. Elsewhere, no definite lesions are identified within the thoracic spinal cord, although motion degradation significantly limits evaluation. Severe motion degradation of the post-contrast imaging precludes evaluation for pathologic spinal cord enhancement. 3. Thoracic spondylosis, as outlined. No significant spinal canal or foraminal  stenosis. Electronically Signed   By: Kellie Simmering D.O.   On: 03/20/2022 14:22   DG Hip Unilat With Pelvis 2-3 Views Right  Result Date: 03/20/2022 CLINICAL DATA:  Fall yesterday with right hip pain. EXAM: DG HIP (WITH OR WITHOUT PELVIS) 2-3V RIGHT COMPARISON:  None Available. FINDINGS: There is no acute fracture or dislocation. Femoroacetabular alignment is normal. The SI joints and symphysis pubis are intact. The soft tissues are unremarkable. IMPRESSION: No acute fracture or dislocation. Electronically Signed   By: Valetta Mole M.D.   On: 03/20/2022 11:59       Assessment & Plan:    Principal Problem:   Multiple sclerosis exacerbation (HCC) Active Problems:   Hyperlipidemia   Hypokalemia   Pyuria    MS exacerbation -Patient with known history of MS, she is followed by Dr. Merlene Laughter as an outpatient -Her MRI of brain/thoracic/Lumbar spine significant for multiple enhancing lesions. -Neurology consult greatly appreciated, patient will be admitted for pulse dose steroids, initial recommendation for 3 days, and to be reassessed on Monday by neurology to see if needs to extend for 5 days. -Continue with Protonix 40 mg twice daily while on pulse dose steroids -Monitor CBG and start insulin if needed due to steroids -We will consult PT/OT.   Leg pain  - due to MS exacerbation, will keep on as needed Tylenol, oxycodone and morphine, and will start on as needed Robaxin -Given she is on statin, with muscle pain, will hold statin for now till CK is obtained and normal  Hyperlipidemia -Resume statin if CK is normal  Pyuria -Patient denies any urinary symptoms, but given she is on pulse dose steroids, immunocompromise, will keep empirically on IV Rocephin until urine cultures are obtained  DVT Prophylaxis   Lovenox  AM Labs Ordered, also please review Full Orders  Family Communication: Admission, patients condition and plan of care including tests being ordered have been discussed with  the patient and father at bedside who indicate understanding and agree with the plan and Code Status.  Code Status home  Likely DC to  home  Condition GUARDED    Consults called: neurology    Admission status: inpatient    Time spent in minutes : 60 minutes   Phillips Climes M.D on 03/20/2022 at 4:38 PM   [facility] - Office  [phone]

## 2022-03-21 DIAGNOSIS — E785 Hyperlipidemia, unspecified: Secondary | ICD-10-CM | POA: Diagnosis not present

## 2022-03-21 DIAGNOSIS — G35 Multiple sclerosis: Secondary | ICD-10-CM | POA: Diagnosis not present

## 2022-03-21 DIAGNOSIS — E876 Hypokalemia: Secondary | ICD-10-CM | POA: Diagnosis not present

## 2022-03-21 LAB — CBC
HCT: 40.2 % (ref 36.0–46.0)
Hemoglobin: 12.7 g/dL (ref 12.0–15.0)
MCH: 26.6 pg (ref 26.0–34.0)
MCHC: 31.6 g/dL (ref 30.0–36.0)
MCV: 84.3 fL (ref 80.0–100.0)
Platelets: 303 10*3/uL (ref 150–400)
RBC: 4.77 MIL/uL (ref 3.87–5.11)
RDW: 14.6 % (ref 11.5–15.5)
WBC: 9.4 10*3/uL (ref 4.0–10.5)
nRBC: 0 % (ref 0.0–0.2)

## 2022-03-21 LAB — BASIC METABOLIC PANEL
Anion gap: 7 (ref 5–15)
BUN: 14 mg/dL (ref 6–20)
CO2: 28 mmol/L (ref 22–32)
Calcium: 9.6 mg/dL (ref 8.9–10.3)
Chloride: 104 mmol/L (ref 98–111)
Creatinine, Ser: 0.53 mg/dL (ref 0.44–1.00)
GFR, Estimated: >60 mL/min (ref >60)
Glucose, Bld: 154 mg/dL — ABNORMAL HIGH (ref 70–99)
Potassium: 4.6 mmol/L (ref 3.5–5.1)
Sodium: 139 mmol/L (ref 135–145)

## 2022-03-21 LAB — GLUCOSE, CAPILLARY
Glucose-Capillary: 142 mg/dL — ABNORMAL HIGH (ref 70–99)
Glucose-Capillary: 147 mg/dL — ABNORMAL HIGH (ref 70–99)
Glucose-Capillary: 144 mg/dL — ABNORMAL HIGH (ref 70–99)
Glucose-Capillary: 189 mg/dL — ABNORMAL HIGH (ref 70–99)

## 2022-03-21 LAB — HIV ANTIBODY (ROUTINE TESTING W REFLEX): HIV Screen 4th Generation wRfx: NONREACTIVE

## 2022-03-21 MED ORDER — GABAPENTIN 300 MG PO CAPS
300.0000 mg | ORAL_CAPSULE | Freq: Two times a day (BID) | ORAL | Status: DC
Start: 2022-03-21 — End: 2022-03-24
  Administered 2022-03-21 – 2022-03-24 (×7): 300 mg via ORAL
  Filled 2022-03-21 (×7): qty 1

## 2022-03-21 MED ORDER — ROSUVASTATIN CALCIUM 10 MG PO TABS
5.0000 mg | ORAL_TABLET | Freq: Every day | ORAL | Status: DC
Start: 2022-03-21 — End: 2022-03-24
  Administered 2022-03-22 – 2022-03-23 (×3): 5 mg via ORAL
  Filled 2022-03-21 (×3): qty 1

## 2022-03-21 MED ORDER — MORPHINE SULFATE (PF) 2 MG/ML IV SOLN: 1.0000 mg | INTRAVENOUS | Status: DC | PRN

## 2022-03-21 NOTE — Evaluation (Signed)
Physical Therapy Evaluation Patient Details Name: Erin Berg MRN: [id number] DOB: Jan 09, 1966 Today's Date: 03/21/2022  History of Present Illness  Erin Berg  is a 56 y.o. female, with past medical history of MS, hyperlipidemia, presents to ED secondary to suspicion for MS flare, she presents mainly today with lower extremity pain, but upon further questioning does appear she has been having progressive weakness over the last 2 to 3 weeks, she is wheelchair dependent, but able to ambulate from bed to chair, but this has been progressively weak over the last few weeks, as well she had multiple falls recently per her father at bedside, she denies nausea, vomiting, fever, chills, polyuria or dysuria, she denies any double vision, blurry vision or headache.  -In ED labs were significant for potassium of 3.2, her MRI brain/cervical/thoracic spine were significant for multiple enhancing lesion, neurology recommended admission for pulse steroids, [facility] consulted to admit   Clinical Impression  Patient had episode of severe pain due to cramping in right quadriceps, was relived with rest and gentle massaging,  demonstrates slow labored movement for sitting up at bedside, very unsteady on feet with difficulty moving LLE due to poor motor control and lack of sensation in feet and required Mod/max assist and tactile assistance to slide LLE during transfer to chair.  Patient tolerated sitting up in chair after therapy - nursing staff notified.  Patient will benefit from continued skilled physical therapy in hospital and recommended venue below to increase strength, balance, endurance for safe ADLs and gait.       Recommendations for follow up therapy are one component of a multi-disciplinary discharge planning process, led by the attending physician.  Recommendations may be updated based on patient status, additional functional criteria and insurance authorization.  Follow Up Recommendations  Home health PT      Assistance Recommended at Discharge Intermittent Supervision/Assistance  Patient can return home with the following  A lot of help with walking and/or transfers;A lot of help with bathing/dressing/bathroom;Help with stairs or ramp for entrance;Assistance with cooking/housework    Equipment Recommendations BSC/3in1  Recommendations for Other Services       Functional Status Assessment Patient has had a recent decline in their functional status and/or demonstrates limited ability to make significant improvements in function in a reasonable and predictable amount of time     Precautions / Restrictions Precautions Precautions: Fall Restrictions Weight Bearing Restrictions: No      Mobility  Bed Mobility Overal bed mobility: Needs Assistance Bed Mobility: Supine to Sit     Supine to sit: Min assist     General bed mobility comments: increased time, labored movement with bed flat    Transfers Overall transfer level: Needs assistance Equipment used: Rolling walker (2 wheels) Transfers: Sit to/from Stand, Bed to chair/wheelchair/BSC Sit to Stand: Min assist   Step pivot transfers: Mod assist       General transfer comment: slow labored movement with difficulty moving LLE whe standing due to poor motor control    Ambulation/Gait Ambulation/Gait assistance: Mod assist, Max assist Gait Distance (Feet): 4 Feet Assistive device: Rolling walker (2 wheels) Gait Pattern/deviations: Decreased step length - left, Decreased step length - right, Decreased stride length, Decreased stance time - left, Shuffle Gait velocity: decreased     General Gait Details: limited to a few side steps at bedside with diffiuclty moving LLE due to poor motor control, limited sensation bilateral feet, required tactile assistance to move LLE  Stairs  Wheelchair Mobility    Modified Rankin (Stroke Patients Only)       Balance Overall balance assessment:  Needs assistance Sitting-balance support: Feet supported, No upper extremity supported Sitting balance-Leahy Scale: Fair Sitting balance - Comments: fair/good seated at EOB   Standing balance support: During functional activity, Bilateral upper extremity supported, Reliant on assistive device for balance Standing balance-Leahy Scale: Poor Standing balance comment: fair/poor using RW                             Pertinent Vitals/Pain Pain Assessment Pain Assessment: Faces Faces Pain Scale: Hurts whole lot Pain Location: right anterior thigh due to intermittent spasming Pain Descriptors / Indicators: Spasm Pain Intervention(s): Limited activity within patient's tolerance, Monitored during session, Repositioned, Utilized relaxation techniques    Home Living Family/patient expects to be discharged to:: Private residence Living Arrangements: Children Available Help at Discharge: Family;Personal care attendant;Available PRN/intermittently Type of Home: House Home Access: Ramped entrance       Home Layout: One level Home Equipment: Conservation officer, nature (2 wheels);Wheelchair - manual;Shower seat;Grab bars - toilet      Prior Function Prior Level of Function : Needs assist       Physical Assist : Mobility (physical);ADLs (physical) Mobility (physical): Bed mobility;Transfers;Gait   Mobility Comments: uses wheelchair mostly, very short distances using RW for transfers to commode, bed to wheel, propels self in wheelchair ADLs Comments: home aides from 7:am to 1:pm on MWF,  Th, from  9:am to 3:pm  on Tu, Th, and 4 hours on Sat, 17 y/o son also assist     Hand Dominance        Extremity/Trunk Assessment   Upper Extremity Assessment Upper Extremity Assessment: Overall WFL for tasks assessed    Lower Extremity Assessment Lower Extremity Assessment: Generalized weakness;RLE deficits/detail;LLE deficits/detail RLE Deficits / Details: grossly -4/5 RLE Sensation: decreased  light touch;decreased proprioception RLE Coordination: WNL LLE Deficits / Details: grossly -3/5 LLE Sensation: decreased light touch;decreased proprioception LLE Coordination: decreased gross motor    Cervical / Trunk Assessment Cervical / Trunk Assessment: Normal  Communication   Communication: No difficulties  Cognition Arousal/Alertness: Awake/alert Behavior During Therapy: WFL for tasks assessed/performed Overall Cognitive Status: Within Functional Limits for tasks assessed                                          General Comments      Exercises     Assessment/Plan    PT Assessment Patient needs continued PT services  PT Problem List Decreased strength;Decreased activity tolerance;Decreased balance;Decreased mobility;Impaired sensation;Decreased coordination       PT Treatment Interventions DME instruction;Gait training;Functional mobility training;Therapeutic activities;Therapeutic exercise;Patient/family education;Balance training;Neuromuscular re-education    PT Goals (Current goals can be found in the Care Plan section)  Acute Rehab PT Goals Patient Stated Goal: return with home aides and family to assist PT Goal Formulation: With patient Time For Goal Achievement: 03/28/22 Potential to Achieve Goals: Good    Frequency Min 3X/week     Co-evaluation               AM-PAC PT "6 Clicks" Mobility  Outcome Measure Help needed turning from your back to your side while in a flat bed without using bedrails?: None Help needed moving from lying on your back to sitting on the side of a flat bed  without using bedrails?: A Little Help needed moving to and from a bed to a chair (including a wheelchair)?: A Lot Help needed standing up from a chair using your arms (e.g., wheelchair or bedside chair)?: A Lot Help needed to walk in hospital room?: A Lot Help needed climbing 3-5 steps with a railing? : Total 6 Click Score: 14    End of Session    Activity Tolerance: Patient tolerated treatment well;Patient limited by fatigue;Patient limited by pain Patient left: in chair;with call bell/phone within reach;with chair alarm set Nurse Communication: Mobility status PT Visit Diagnosis: Unsteadiness on feet (R26.81);Other abnormalities of gait and mobility (R26.89);Muscle weakness (generalized) (M62.81)    Time: 9480-1655 PT Time Calculation (min) (ACUTE ONLY): 27 min   Charges:   PT Evaluation $PT Eval Moderate Complexity: 1 Mod PT Treatments $Therapeutic Activity: 23-37 mins        12:56 PM, 03/21/22 Lonell Grandchild, MPT Physical Therapist with [facility] 336 [phone] office [phone] mobile phone

## 2022-03-21 NOTE — Plan of Care (Signed)
  Problem: Acute Rehab PT Goals(only PT should resolve) Goal: Pt Will Go Supine/Side To Sit Outcome: Progressing Flowsheets (Taken 03/21/2022 1257) Pt will go Supine/Side to Sit:  with modified independence  with supervision Goal: Patient Will Transfer Sit To/From Stand Outcome: Progressing Flowsheets (Taken 03/21/2022 1257) Patient will transfer sit to/from stand:  with minimal assist  with min guard assist Goal: Pt Will Transfer Bed To Chair/Chair To Bed Outcome: Progressing Flowsheets (Taken 03/21/2022 1257) Pt will Transfer Bed to Chair/Chair to Bed:  with min assist  with mod assist Goal: Pt Will Ambulate Outcome: Progressing Flowsheets (Taken 03/21/2022 1257) Pt will Ambulate:  10 feet  with moderate assist  with rolling walker   12:57 PM, 03/21/22 Lonell Grandchild, MPT Physical Therapist with [facility] 336 [phone] office [phone] mobile phone

## 2022-03-21 NOTE — TOC Progression Note (Signed)
Transition of Care [facility]) - Progression Note    Patient Details  Name: Erin Berg MRN: [id number] Date of Birth: Jun 15, 1966  Transition of Care [facility]) CM/SW Contact  Salome Arnt, [place] Phone Number: 03/21/2022, 9:19 AM  Clinical Narrative:   Transition of Care [facility]) Screening Note   Patient Details  Name: Erin Berg Date of Birth: [birth year]   Transition of Care [facility]) CM/SW Contact:    Salome Arnt, [place] Phone Number: 03/21/2022, 9:19 AM    Transition of Care Department [facility]) has reviewed patient and no TOC needs have been identified at this time. We will continue to monitor patient advancement through interdisciplinary progression rounds. If new patient transition needs arise, please place a TOC consult.            Expected Discharge Plan and Services                                                 Social Determinants of Health (SDOH) Interventions    Readmission Risk Interventions     No data to display

## 2022-03-21 NOTE — Progress Notes (Signed)
PROGRESS NOTE   Erin Berg  VOH:[id number] DOB: 1966/06/28 DOA: 03/20/2022 PCP: Redmond School, MD   Chief Complaint  Patient presents with   Leg Pain   Level of care: Med-Surg  Brief Admission History:  56 y.o. female, with past medical history of MS, hyperlipidemia, presents to ED secondary to suspicion for MS flare, she presents mainly today with lower extremity pain, but upon further questioning does appear she has been having progressive weakness over the last 2 to 3 weeks, she is wheelchair dependent, but able to ambulate from bed to chair, but this has been progressively weak over the last few weeks, as well she had multiple falls recently per her father at bedside, she denies nausea, vomiting, fever, chills, polyuria or dysuria, she denies any double vision, blurry vision or headache. -In ED labs were significant for potassium of 3.2, her MRI brain/cervical/thoracic spine were significant for multiple enhancing lesion, neurology recommended admission for pulse steroids, [facility] hospitalist consulted to admit   Assessment and Plan:  Acute MS exacerbation -Patient with known history of MS, she is followed by Dr. Merlene Laughter as an outpatient but has not seen him in 5-6 years but seen by his PA.  -Her MRI of brain/thoracic/Lumbar spine significant for multiple enhancing lesions. -Neurology consult greatly appreciated, patient will be admitted for pulse dose steroids, initial recommendation for 3 days, and to be reassessed on Monday by neurology to see if needs to extend for 5 days. -Continue with Protonix 40 mg twice daily while on pulse dose steroids -Monitor CBG and start insulin if needed due to steroids -We will consult PT/OT.    Leg pain  - due to MS exacerbation, will keep on as needed Tylenol, oxycodone and morphine, and will start on as needed Robaxin - CK is normal   Hyperlipidemia -Resume statin    Pyuria -Patient denies any urinary symptoms, but given she is on pulse  dose steroids, immunocompromise, will keep empirically on IV Rocephin until urine cultures are obtained  DVT prophylaxis: enoxaparin Code Status: Full  Family Communication:  Disposition: Status is: Inpatient Remains inpatient appropriate because: high dose IV steroids    Consultants:  Neurology  Procedures:   Antimicrobials:    Subjective: Reports spasms in legs.   Objective: Vitals:   03/20/22 1530 03/20/22 1744 03/20/22 2113 03/21/22 0317  BP: (!) 145/87 (!) 152/94 (!) 156/89 129/82  Pulse: 91 96 88 78  Resp: '18 19 18 19  '$ Temp:  97.9 F (36.6 C) 98.3 F (36.8 C) (!) 97.4 F (36.3 C)  TempSrc:  Oral    SpO2: 95%  96% 97%  Weight:  97.1 kg    Height:  '5\' 5"'$  (1.651 m)      Intake/Output Summary (Last 24 hours) at 03/21/2022 1333 Last data filed at 03/21/2022 0500 Gross per 24 hour  Intake 160.67 ml  Output 200 ml  Net -39.33 ml   Filed Weights   03/20/22 1744  Weight: 97.1 kg   Examination:  General exam: Appears calm and comfortable  Respiratory system: Clear to auscultation. Respiratory effort normal. Cardiovascular system: normal S1 & S2 heard. No JVD, murmurs, rubs, gallops or clicks. No pedal edema. Gastrointestinal system: Abdomen is nondistended, soft and nontender. No organomegaly or masses felt. Normal bowel sounds heard. Central nervous system: Alert and oriented.  Extremities: trace edema. Skin: No rashes, lesions or ulcers. Psychiatry: Judgement and insight appear normal. Mood & affect appropriate.   Data Reviewed: I have personally reviewed following labs and imaging  studies  CBC: Recent Labs  Lab 03/20/22 1111 03/21/22 0421  WBC 7.0 9.4  NEUTROABS 5.1  --   HGB 13.7 12.7  HCT 42.3 40.2  MCV 83.1 84.3  PLT 249 789    Basic Metabolic Panel: Recent Labs  Lab 03/20/22 1111 03/21/22 0421  NA 141 139  K 3.2* 4.6  CL 106 104  CO2 27 28  GLUCOSE 97 154*  BUN 19 14  CREATININE 0.52 0.53  CALCIUM 9.6 9.6    CBG: Recent Labs  Lab  03/20/22 2116 03/21/22 0743 03/21/22 1113  GLUCAP 155* 142* 147*    No results found for this or any previous visit (from the past 240 hour(s)).   Radiology Studies: MR Brain W and Wo Contrast  Result Date: 03/20/2022 CLINICAL DATA:  Provided history: Multiple sclerosis. MS flare up. EXAM: MRI HEAD WITHOUT AND WITH CONTRAST MRI CERVICAL SPINE WITHOUT AND WITH CONTRAST MRI THORACIC SPINE WITHOUT AND WITH CONTRAST CONTRAST:  46m GADAVIST GADOBUTROL 1 MMOL/ML IV SOLN TECHNIQUE: Multiplanar, multiecho pulse sequences of the brain and surrounding structures, and cervical and thoracic spine were obtained without and with intravenous contrast. COMPARISON:  Prior brain MRI examinations 02/08/2020 and earlier. Cervical spine MRI 03/14/2014. FINDINGS: MRI HEAD FINDINGS The examination is intermittently motion degraded. Most notably, there is severe motion degradation of the pre-contrast axial T1-weighted sequence and moderate-to-severe motion degradation of the coronal T2 sequence. Brain: Age advanced parenchymal atrophy. Severe patchy and confluent T2 FLAIR hyperintense signal abnormality within the cerebral white matter. Diffuse T2 FLAIR hyperintense signal abnormality and atrophy of the corpus callosum. 13 x 8 mm peripherally enhancing lesion within the posterior right temporal lobe white matter compatible with a site of active demyelination. 3 mm focus of enhancement within the posterior right frontal lobe white matter, likely reflecting an additional site of active demyelination (series 21, image 15) (series 20, image 37). No new white matter lesion is identified elsewhere. There is no acute infarct. No evidence of an intracranial mass. No chronic intracranial blood products. No extra-axial fluid collection. No midline shift. Vascular: Maintained flow voids within the proximal large arterial vessels. Skull and upper cervical spine: No focal suspicious marrow lesion. Sinuses/Orbits: No mass or acute finding  within the imaged orbits. 2 cm mucous retention cyst within the left maxillary sinus. Mild mucosal thickening within the left frontal sinus. MRI CERVICAL SPINE FINDINGS Intermittently motion degraded examination, limiting evaluation. Most notably, there is moderate to severe motion degradation of the sagittal T2 sequence, moderate to severe motion degradation of the sagittal STIR sequence, moderate motion degradation of the axial T2-weighted sequences and moderate motion degradation of the sagittal T1-weighted post-contrast sequence. Alignment: Reversal of the expected cervical lordosis. No significant spondylolisthesis. Vertebrae: Vertebral body height is maintained. Within the limitations of motion degradation, no significant marrow edema or focal suspicious osseous lesion is identified. Degenerative endplate irregularity at C5-C6. Cord: Small T2 hyperintense lesions within the posterior aspect of the spinal cord at the C2-C3 level, unchanged from the prior examination of 03/14/2014. No other definite lesions are identified within the cervical spinal cord, although motion degradation significantly limits evaluation. Within the limitations of motion degradation, no definite pathologic enhancement of the cervical spinal cord is appreciated. Posterior Fossa, vertebral arteries, paraspinal tissues: Posterior fossa better assessed on concurrently performed brain MRI. Flow voids preserved within the imaged cervical vertebral arteries. No paraspinal mass or collection. Disc levels: Unless otherwise stated, the level by level findings below have not significantly changed from the prior MRI  of 03/14/2014. Progressive disc degeneration. Most notably, progressive mild-to-moderate disc degeneration is present at C5-C6. C2-C3: Slight disc bulge, new from the prior MRI. No significant spinal canal or foraminal stenosis. C3-C4: No significant disc herniation or stenosis. C4-C5: No significant disc herniation or stenosis. C5-C6:  Disc bulge with endplate spurring and bilateral uncovertebral hypertrophy, progressed. The disc bulge mildly effaces the ventral thecal sac and may contact the ventral aspect of the spinal cord. Progressive bilateral neural foraminal narrowing (mild right, mild to moderate left). C6-C7: Shallow disc bulge, new from the prior MRI. Mild effacement of the ventral thecal sac (without spinal cord mass effect). No significant foraminal stenosis. C7-T1: No significant disc herniation or stenosis. MRI THORACIC SPINE FINDINGS The examination is intermittently motion degraded, limiting evaluation. Most notably, the sagittal T2 sequence is mild to moderately motion degraded, the axial T2 weighted sequences are moderately motion degraded, the sagittal T1 weighted post-contrast sequence is severely motion degraded and the axial T1 weighted post-contrast sequence is severely motion degraded. Alignment:  No significant spondylolisthesis. Vertebrae: Vertebral body height is maintained. No significant marrow edema or focal suspicious osseous lesion. Cord: Short segment T2 hyperintense lesions (versus artifact) within the left aspect of the spinal cord at the T3 level (series 6, image 9) and within the anterior and right aspect of the spinal cord at the T11 level (series 6, image 33). No definite lesions are identified elsewhere within the spinal cord at the thoracic levels, although motion degradation significantly limits evaluation. Severely motion degraded postcontrast imaging, precluding evaluation for pathologic spinal cord enhancement at the thoracic levels. Paraspinal and other soft tissues: No abnormality is identified within included portions of the thorax or upper abdomen/retroperitoneum. No paraspinal mass or collection. Disc levels: No more than mild disc degeneration within the thoracic spine. Small multilevel disc bulges. No focal disc herniation, significant spinal canal stenosis or neural foraminal narrowing.  IMPRESSION: MRI brain: 1. Significant intermittent motion degradation. 2. Extensive T2 FLAIR hyperintense signal abnormality within the supratentorial white matter, compatible with the provided history of multiple sclerosis. 13 x 8 mm peripherally enhancing lesion within the posterior right temporal lobe white matter, consistent with a site of active demyelination. 3 mm focus of enhancement within the posterior right frontal lobe white matter, likely reflecting an additional site of active demyelination. No other definitively new lesions are identified. 3. Age advanced parenchymal atrophy. 4. Paranasal sinus disease, as described. MRI cervical spine: 1. Significantly motion degraded examination. 2. Small T2 hyperintense lesions within the posterior aspect of the spinal cord at the C2-C3 level, unchanged from the prior MRI of 03/14/2014. No definite lesions are identified elsewhere within the cervical spinal cord, although motion degradation significantly limits evaluation. No appreciable pathologic spinal cord enhancement. 3. Progressive cervical spondylosis, greatest at C5-C6. MRI thoracic spine: 1. Significantly motion degraded examination. 2. Short-segment T2 hyperintense lesions (versus artifact) within the left aspect of the spinal cord at the T3 level, and within the ventral and right aspect of the spinal cord at the T11 level. Elsewhere, no definite lesions are identified within the thoracic spinal cord, although motion degradation significantly limits evaluation. Severe motion degradation of the post-contrast imaging precludes evaluation for pathologic spinal cord enhancement. 3. Thoracic spondylosis, as outlined. No significant spinal canal or foraminal stenosis. Electronically Signed   By: Kellie Simmering D.O.   On: 03/20/2022 14:22   MR Cervical Spine W or Wo Contrast  Result Date: 03/20/2022 CLINICAL DATA:  Provided history: Multiple sclerosis. MS flare up. EXAM: MRI HEAD  WITHOUT AND WITH CONTRAST MRI  CERVICAL SPINE WITHOUT AND WITH CONTRAST MRI THORACIC SPINE WITHOUT AND WITH CONTRAST CONTRAST:  60m GADAVIST GADOBUTROL 1 MMOL/ML IV SOLN TECHNIQUE: Multiplanar, multiecho pulse sequences of the brain and surrounding structures, and cervical and thoracic spine were obtained without and with intravenous contrast. COMPARISON:  Prior brain MRI examinations 02/08/2020 and earlier. Cervical spine MRI 03/14/2014. FINDINGS: MRI HEAD FINDINGS The examination is intermittently motion degraded. Most notably, there is severe motion degradation of the pre-contrast axial T1-weighted sequence and moderate-to-severe motion degradation of the coronal T2 sequence. Brain: Age advanced parenchymal atrophy. Severe patchy and confluent T2 FLAIR hyperintense signal abnormality within the cerebral white matter. Diffuse T2 FLAIR hyperintense signal abnormality and atrophy of the corpus callosum. 13 x 8 mm peripherally enhancing lesion within the posterior right temporal lobe white matter compatible with a site of active demyelination. 3 mm focus of enhancement within the posterior right frontal lobe white matter, likely reflecting an additional site of active demyelination (series 21, image 15) (series 20, image 37). No new white matter lesion is identified elsewhere. There is no acute infarct. No evidence of an intracranial mass. No chronic intracranial blood products. No extra-axial fluid collection. No midline shift. Vascular: Maintained flow voids within the proximal large arterial vessels. Skull and upper cervical spine: No focal suspicious marrow lesion. Sinuses/Orbits: No mass or acute finding within the imaged orbits. 2 cm mucous retention cyst within the left maxillary sinus. Mild mucosal thickening within the left frontal sinus. MRI CERVICAL SPINE FINDINGS Intermittently motion degraded examination, limiting evaluation. Most notably, there is moderate to severe motion degradation of the sagittal T2 sequence, moderate to severe  motion degradation of the sagittal STIR sequence, moderate motion degradation of the axial T2-weighted sequences and moderate motion degradation of the sagittal T1-weighted post-contrast sequence. Alignment: Reversal of the expected cervical lordosis. No significant spondylolisthesis. Vertebrae: Vertebral body height is maintained. Within the limitations of motion degradation, no significant marrow edema or focal suspicious osseous lesion is identified. Degenerative endplate irregularity at C5-C6. Cord: Small T2 hyperintense lesions within the posterior aspect of the spinal cord at the C2-C3 level, unchanged from the prior examination of 03/14/2014. No other definite lesions are identified within the cervical spinal cord, although motion degradation significantly limits evaluation. Within the limitations of motion degradation, no definite pathologic enhancement of the cervical spinal cord is appreciated. Posterior Fossa, vertebral arteries, paraspinal tissues: Posterior fossa better assessed on concurrently performed brain MRI. Flow voids preserved within the imaged cervical vertebral arteries. No paraspinal mass or collection. Disc levels: Unless otherwise stated, the level by level findings below have not significantly changed from the prior MRI of 03/14/2014. Progressive disc degeneration. Most notably, progressive mild-to-moderate disc degeneration is present at C5-C6. C2-C3: Slight disc bulge, new from the prior MRI. No significant spinal canal or foraminal stenosis. C3-C4: No significant disc herniation or stenosis. C4-C5: No significant disc herniation or stenosis. C5-C6: Disc bulge with endplate spurring and bilateral uncovertebral hypertrophy, progressed. The disc bulge mildly effaces the ventral thecal sac and may contact the ventral aspect of the spinal cord. Progressive bilateral neural foraminal narrowing (mild right, mild to moderate left). C6-C7: Shallow disc bulge, new from the prior MRI. Mild  effacement of the ventral thecal sac (without spinal cord mass effect). No significant foraminal stenosis. C7-T1: No significant disc herniation or stenosis. MRI THORACIC SPINE FINDINGS The examination is intermittently motion degraded, limiting evaluation. Most notably, the sagittal T2 sequence is mild to moderately motion degraded, the axial T2  weighted sequences are moderately motion degraded, the sagittal T1 weighted post-contrast sequence is severely motion degraded and the axial T1 weighted post-contrast sequence is severely motion degraded. Alignment:  No significant spondylolisthesis. Vertebrae: Vertebral body height is maintained. No significant marrow edema or focal suspicious osseous lesion. Cord: Short segment T2 hyperintense lesions (versus artifact) within the left aspect of the spinal cord at the T3 level (series 6, image 9) and within the anterior and right aspect of the spinal cord at the T11 level (series 6, image 33). No definite lesions are identified elsewhere within the spinal cord at the thoracic levels, although motion degradation significantly limits evaluation. Severely motion degraded postcontrast imaging, precluding evaluation for pathologic spinal cord enhancement at the thoracic levels. Paraspinal and other soft tissues: No abnormality is identified within included portions of the thorax or upper abdomen/retroperitoneum. No paraspinal mass or collection. Disc levels: No more than mild disc degeneration within the thoracic spine. Small multilevel disc bulges. No focal disc herniation, significant spinal canal stenosis or neural foraminal narrowing. IMPRESSION: MRI brain: 1. Significant intermittent motion degradation. 2. Extensive T2 FLAIR hyperintense signal abnormality within the supratentorial white matter, compatible with the provided history of multiple sclerosis. 13 x 8 mm peripherally enhancing lesion within the posterior right temporal lobe white matter, consistent with a site of  active demyelination. 3 mm focus of enhancement within the posterior right frontal lobe white matter, likely reflecting an additional site of active demyelination. No other definitively new lesions are identified. 3. Age advanced parenchymal atrophy. 4. Paranasal sinus disease, as described. MRI cervical spine: 1. Significantly motion degraded examination. 2. Small T2 hyperintense lesions within the posterior aspect of the spinal cord at the C2-C3 level, unchanged from the prior MRI of 03/14/2014. No definite lesions are identified elsewhere within the cervical spinal cord, although motion degradation significantly limits evaluation. No appreciable pathologic spinal cord enhancement. 3. Progressive cervical spondylosis, greatest at C5-C6. MRI thoracic spine: 1. Significantly motion degraded examination. 2. Short-segment T2 hyperintense lesions (versus artifact) within the left aspect of the spinal cord at the T3 level, and within the ventral and right aspect of the spinal cord at the T11 level. Elsewhere, no definite lesions are identified within the thoracic spinal cord, although motion degradation significantly limits evaluation. Severe motion degradation of the post-contrast imaging precludes evaluation for pathologic spinal cord enhancement. 3. Thoracic spondylosis, as outlined. No significant spinal canal or foraminal stenosis. Electronically Signed   By: Kellie Simmering D.O.   On: 03/20/2022 14:22   MR THORACIC SPINE W WO CONTRAST  Result Date: 03/20/2022 CLINICAL DATA:  Provided history: Multiple sclerosis. MS flare up. EXAM: MRI HEAD WITHOUT AND WITH CONTRAST MRI CERVICAL SPINE WITHOUT AND WITH CONTRAST MRI THORACIC SPINE WITHOUT AND WITH CONTRAST CONTRAST:  43m GADAVIST GADOBUTROL 1 MMOL/ML IV SOLN TECHNIQUE: Multiplanar, multiecho pulse sequences of the brain and surrounding structures, and cervical and thoracic spine were obtained without and with intravenous contrast. COMPARISON:  Prior brain MRI  examinations 02/08/2020 and earlier. Cervical spine MRI 03/14/2014. FINDINGS: MRI HEAD FINDINGS The examination is intermittently motion degraded. Most notably, there is severe motion degradation of the pre-contrast axial T1-weighted sequence and moderate-to-severe motion degradation of the coronal T2 sequence. Brain: Age advanced parenchymal atrophy. Severe patchy and confluent T2 FLAIR hyperintense signal abnormality within the cerebral white matter. Diffuse T2 FLAIR hyperintense signal abnormality and atrophy of the corpus callosum. 13 x 8 mm peripherally enhancing lesion within the posterior right temporal lobe white matter compatible with a site of  active demyelination. 3 mm focus of enhancement within the posterior right frontal lobe white matter, likely reflecting an additional site of active demyelination (series 21, image 15) (series 20, image 37). No new white matter lesion is identified elsewhere. There is no acute infarct. No evidence of an intracranial mass. No chronic intracranial blood products. No extra-axial fluid collection. No midline shift. Vascular: Maintained flow voids within the proximal large arterial vessels. Skull and upper cervical spine: No focal suspicious marrow lesion. Sinuses/Orbits: No mass or acute finding within the imaged orbits. 2 cm mucous retention cyst within the left maxillary sinus. Mild mucosal thickening within the left frontal sinus. MRI CERVICAL SPINE FINDINGS Intermittently motion degraded examination, limiting evaluation. Most notably, there is moderate to severe motion degradation of the sagittal T2 sequence, moderate to severe motion degradation of the sagittal STIR sequence, moderate motion degradation of the axial T2-weighted sequences and moderate motion degradation of the sagittal T1-weighted post-contrast sequence. Alignment: Reversal of the expected cervical lordosis. No significant spondylolisthesis. Vertebrae: Vertebral body height is maintained. Within the  limitations of motion degradation, no significant marrow edema or focal suspicious osseous lesion is identified. Degenerative endplate irregularity at C5-C6. Cord: Small T2 hyperintense lesions within the posterior aspect of the spinal cord at the C2-C3 level, unchanged from the prior examination of 03/14/2014. No other definite lesions are identified within the cervical spinal cord, although motion degradation significantly limits evaluation. Within the limitations of motion degradation, no definite pathologic enhancement of the cervical spinal cord is appreciated. Posterior Fossa, vertebral arteries, paraspinal tissues: Posterior fossa better assessed on concurrently performed brain MRI. Flow voids preserved within the imaged cervical vertebral arteries. No paraspinal mass or collection. Disc levels: Unless otherwise stated, the level by level findings below have not significantly changed from the prior MRI of 03/14/2014. Progressive disc degeneration. Most notably, progressive mild-to-moderate disc degeneration is present at C5-C6. C2-C3: Slight disc bulge, new from the prior MRI. No significant spinal canal or foraminal stenosis. C3-C4: No significant disc herniation or stenosis. C4-C5: No significant disc herniation or stenosis. C5-C6: Disc bulge with endplate spurring and bilateral uncovertebral hypertrophy, progressed. The disc bulge mildly effaces the ventral thecal sac and may contact the ventral aspect of the spinal cord. Progressive bilateral neural foraminal narrowing (mild right, mild to moderate left). C6-C7: Shallow disc bulge, new from the prior MRI. Mild effacement of the ventral thecal sac (without spinal cord mass effect). No significant foraminal stenosis. C7-T1: No significant disc herniation or stenosis. MRI THORACIC SPINE FINDINGS The examination is intermittently motion degraded, limiting evaluation. Most notably, the sagittal T2 sequence is mild to moderately motion degraded, the axial T2  weighted sequences are moderately motion degraded, the sagittal T1 weighted post-contrast sequence is severely motion degraded and the axial T1 weighted post-contrast sequence is severely motion degraded. Alignment:  No significant spondylolisthesis. Vertebrae: Vertebral body height is maintained. No significant marrow edema or focal suspicious osseous lesion. Cord: Short segment T2 hyperintense lesions (versus artifact) within the left aspect of the spinal cord at the T3 level (series 6, image 9) and within the anterior and right aspect of the spinal cord at the T11 level (series 6, image 33). No definite lesions are identified elsewhere within the spinal cord at the thoracic levels, although motion degradation significantly limits evaluation. Severely motion degraded postcontrast imaging, precluding evaluation for pathologic spinal cord enhancement at the thoracic levels. Paraspinal and other soft tissues: No abnormality is identified within included portions of the thorax or upper abdomen/retroperitoneum. No paraspinal mass  or collection. Disc levels: No more than mild disc degeneration within the thoracic spine. Small multilevel disc bulges. No focal disc herniation, significant spinal canal stenosis or neural foraminal narrowing. IMPRESSION: MRI brain: 1. Significant intermittent motion degradation. 2. Extensive T2 FLAIR hyperintense signal abnormality within the supratentorial white matter, compatible with the provided history of multiple sclerosis. 13 x 8 mm peripherally enhancing lesion within the posterior right temporal lobe white matter, consistent with a site of active demyelination. 3 mm focus of enhancement within the posterior right frontal lobe white matter, likely reflecting an additional site of active demyelination. No other definitively new lesions are identified. 3. Age advanced parenchymal atrophy. 4. Paranasal sinus disease, as described. MRI cervical spine: 1. Significantly motion degraded  examination. 2. Small T2 hyperintense lesions within the posterior aspect of the spinal cord at the C2-C3 level, unchanged from the prior MRI of 03/14/2014. No definite lesions are identified elsewhere within the cervical spinal cord, although motion degradation significantly limits evaluation. No appreciable pathologic spinal cord enhancement. 3. Progressive cervical spondylosis, greatest at C5-C6. MRI thoracic spine: 1. Significantly motion degraded examination. 2. Short-segment T2 hyperintense lesions (versus artifact) within the left aspect of the spinal cord at the T3 level, and within the ventral and right aspect of the spinal cord at the T11 level. Elsewhere, no definite lesions are identified within the thoracic spinal cord, although motion degradation significantly limits evaluation. Severe motion degradation of the post-contrast imaging precludes evaluation for pathologic spinal cord enhancement. 3. Thoracic spondylosis, as outlined. No significant spinal canal or foraminal stenosis. Electronically Signed   By: Kellie Simmering D.O.   On: 03/20/2022 14:22   DG Hip Unilat With Pelvis 2-3 Views Right  Result Date: 03/20/2022 CLINICAL DATA:  Fall yesterday with right hip pain. EXAM: DG HIP (WITH OR WITHOUT PELVIS) 2-3V RIGHT COMPARISON:  None Available. FINDINGS: There is no acute fracture or dislocation. Femoroacetabular alignment is normal. The SI joints and symphysis pubis are intact. The soft tissues are unremarkable. IMPRESSION: No acute fracture or dislocation. Electronically Signed   By: Valetta Mole M.D.   On: 03/20/2022 11:59    Scheduled Meds:  dalfampridine  10 mg Oral BID   Dimethyl Fumarate  240 mg Oral BID   enoxaparin (LOVENOX) injection  40 mg Subcutaneous Q24H   FLUoxetine  20 mg Oral Daily   pantoprazole  40 mg Oral BID AC   Continuous Infusions:  cefTRIAXone (ROCEPHIN)  IV 1 g (03/20/22 1717)   methylPREDNISolone (SOLU-MEDROL) injection      LOS: 1 day   Time spent: 37  mins  Dajion Bickford Wynetta Emery, MD How to contact the [facility] Attending or Consulting provider [place] or covering provider during after hours [place], for this patient?  Check the care team in [facility] and look for a) attending/consulting [facility] provider listed and b) the [facility] team listed Log into [email or web address] and use [place] universal password to access. If you do not have the password, please contact the hospital operator. Locate the [facility] provider you are looking for under [facility] and page to a number that you can be directly reached. If you still have difficulty reaching the provider, please page the [facility] (Director on Call) for the Hospitalists listed on amion for assistance.  03/21/2022, 1:33 PM

## 2022-03-22 DIAGNOSIS — G35 Multiple sclerosis: Secondary | ICD-10-CM | POA: Diagnosis not present

## 2022-03-22 DIAGNOSIS — E876 Hypokalemia: Secondary | ICD-10-CM | POA: Diagnosis not present

## 2022-03-22 DIAGNOSIS — E785 Hyperlipidemia, unspecified: Secondary | ICD-10-CM | POA: Diagnosis not present

## 2022-03-22 LAB — GLUCOSE, CAPILLARY
Glucose-Capillary: 160 mg/dL — ABNORMAL HIGH (ref 70–99)
Glucose-Capillary: 122 mg/dL — ABNORMAL HIGH (ref 70–99)
Glucose-Capillary: 168 mg/dL — ABNORMAL HIGH (ref 70–99)

## 2022-03-22 MED ORDER — ORAL CARE MOUTH RINSE: 15.0000 mL | OROMUCOSAL | Status: DC | PRN

## 2022-03-22 NOTE — Progress Notes (Addendum)
PROGRESS NOTE   Erin Berg  LGX:[id number] DOB: 1965-09-22 DOA: 03/20/2022 PCP: Redmond School, MD   Chief Complaint  Patient presents with   Leg Pain   Level of care: Med-Surg  Brief Admission History:  56 y.o. female, with past medical history of MS, hyperlipidemia, presents to ED secondary to suspicion for MS flare, she presents mainly today with lower extremity pain, but upon further questioning does appear she has been having progressive weakness over the last 2 to 3 weeks, she is wheelchair dependent, but able to ambulate from bed to chair, but this has been progressively weak over the last few weeks, as well she had multiple falls recently per her father at bedside, she denies nausea, vomiting, fever, chills, polyuria or dysuria, she denies any double vision, blurry vision or headache. -In ED labs were significant for potassium of 3.2, her MRI brain/cervical/thoracic spine were significant for multiple enhancing lesion, neurology recommended admission for pulse steroids, [facility] hospitalist consulted to admit   Assessment and Plan:  Acute MS exacerbation -Patient with known history of MS, she is followed by Dr. Merlene Laughter as an outpatient but has not seen him in 5-6 years but seen by his PA.  -Her MRI of brain/thoracic/Lumbar spine significant for multiple enhancing lesions. -Neurology consult greatly appreciated, patient will be admitted for pulse dose steroids, initial recommendation for 3 days, and to be reassessed on Monday by neurology to see if needs to extend for 5 days. -Continue with Protonix 40 mg twice daily while on pulse dose steroids -Monitor CBG and start insulin if needed due to steroids -PT recommending home health services.  -Pt requested referral to Dr. Felecia Shelling with [facility].  She says she hasn't seen Dr. Merlene Laughter in 6 years and has only seen the PA in last 6 years. Ambulatory referral placed on 8/6.     Leg pain  - due to MS exacerbation, will keep on  as needed Tylenol, oxycodone and morphine, and will start on as needed Robaxin - CK is normal   Hyperlipidemia -Resume statin    Pyuria -Patient denies any urinary symptoms, but given she is on pulse dose steroids, immunocompromise, will keep empirically on IV Rocephin until urine cultures are obtained  DVT prophylaxis: enoxaparin Code Status: Full  Family Communication:  Disposition: Status is: Inpatient Remains inpatient appropriate because: high dose IV steroids    Consultants:  Neurology  Procedures:   Antimicrobials:    Subjective: Pt reports improvement in strength of lower legs.     Objective: Vitals:   03/21/22 0317 03/21/22 1515 03/21/22 2113 03/22/22 0316  BP: 129/82 (!) 147/80 (!) 141/84 (!) 142/82  Pulse: 78 92 84 84  Resp: '19 19 20 18  '$ Temp: (!) 97.4 F (36.3 C) 98.3 F (36.8 C) 98.2 F (36.8 C) 97.8 F (36.6 C)  TempSrc:    Oral  SpO2: 97% 94% 95% 97%  Weight:      Height:        Intake/Output Summary (Last 24 hours) at 03/22/2022 1229 Last data filed at 03/22/2022 0836 Gross per 24 hour  Intake 642.3 ml  Output 1400 ml  Net -757.7 ml   Filed Weights   03/20/22 1744  Weight: 97.1 kg   Examination:  General exam: Appears calm and comfortable  Respiratory system: Clear to auscultation. Respiratory effort normal. Cardiovascular system: normal S1 & S2 heard. No JVD, murmurs, rubs, gallops or clicks. No pedal edema. Gastrointestinal system: Abdomen is nondistended, soft and nontender. No organomegaly or masses  felt. Normal bowel sounds heard. Central nervous system: Alert and oriented.  Extremities: trace edema. Skin: No rashes, lesions or ulcers. Psychiatry: Judgement and insight appear normal. Mood & affect appropriate.   Data Reviewed: I have personally reviewed following labs and imaging studies  CBC: Recent Labs  Lab 03/20/22 1111 03/21/22 0421  WBC 7.0 9.4  NEUTROABS 5.1  --   HGB 13.7 12.7  HCT 42.3 40.2  MCV 83.1 84.3  PLT 249 303     Basic Metabolic Panel: Recent Labs  Lab 03/20/22 1111 03/21/22 0421  NA 141 139  K 3.2* 4.6  CL 106 104  CO2 27 28  GLUCOSE 97 154*  BUN 19 14  CREATININE 0.52 0.53  CALCIUM 9.6 9.6    CBG: Recent Labs  Lab 03/21/22 1113 03/21/22 1700 03/21/22 2151 03/22/22 0751 03/22/22 1129  GLUCAP 147* 144* 189* 160* 122*    Recent Results (from the past 240 hour(s))  Urine Culture     Status: Abnormal (Preliminary result)   Collection Time: 03/20/22  3:24 PM   Specimen: Urine, Clean Catch  Result Value Ref Range Status   Specimen Description   Final    URINE, CLEAN CATCH Performed at [facility], [address], [place], [place] [address]    Special Requests   Final    Immunocompromised Performed at [facility], [address] [place], [place] [address]    Culture (A)  Final    >=100,000 COLONIES/mL ESCHERICHIA COLI >=100,000 COLONIES/mL STREPTOCOCCUS GALLOLYTICUS SUSCEPTIBILITIES TO FOLLOW CULTURE REINCUBATED FOR BETTER GROWTH Performed at [facility] [address] [place], [place] [address]    Report Status PENDING  Incomplete     Radiology Studies: MR Brain W and Wo Contrast  Result Date: 03/20/2022 CLINICAL DATA:  Provided history: Multiple sclerosis. MS flare up. EXAM: MRI HEAD WITHOUT AND WITH CONTRAST MRI CERVICAL SPINE WITHOUT AND WITH CONTRAST MRI THORACIC SPINE WITHOUT AND WITH CONTRAST CONTRAST:  56m GADAVIST GADOBUTROL 1 MMOL/ML IV SOLN TECHNIQUE: Multiplanar, multiecho pulse sequences of the brain and surrounding structures, and cervical and thoracic spine were obtained without and with intravenous contrast. COMPARISON:  Prior brain MRI examinations 02/08/2020 and earlier. Cervical spine MRI 03/14/2014. FINDINGS: MRI HEAD FINDINGS The examination is intermittently motion degraded. Most notably, there is severe motion degradation of the pre-contrast axial T1-weighted sequence and moderate-to-severe motion degradation of the coronal T2 sequence.  Brain: Age advanced parenchymal atrophy. Severe patchy and confluent T2 FLAIR hyperintense signal abnormality within the cerebral white matter. Diffuse T2 FLAIR hyperintense signal abnormality and atrophy of the corpus callosum. 13 x 8 mm peripherally enhancing lesion within the posterior right temporal lobe white matter compatible with a site of active demyelination. 3 mm focus of enhancement within the posterior right frontal lobe white matter, likely reflecting an additional site of active demyelination (series 21, image 15) (series 20, image 37). No new white matter lesion is identified elsewhere. There is no acute infarct. No evidence of an intracranial mass. No chronic intracranial blood products. No extra-axial fluid collection. No midline shift. Vascular: Maintained flow voids within the proximal large arterial vessels. Skull and upper cervical spine: No focal suspicious marrow lesion. Sinuses/Orbits: No mass or acute finding within the imaged orbits. 2 cm mucous retention cyst within the left maxillary sinus. Mild mucosal thickening within the left frontal sinus. MRI CERVICAL SPINE FINDINGS Intermittently motion degraded examination, limiting evaluation. Most notably, there is moderate to severe motion degradation of the sagittal T2 sequence, moderate to severe  motion degradation of the sagittal STIR sequence, moderate motion degradation of the axial T2-weighted sequences and moderate motion degradation of the sagittal T1-weighted post-contrast sequence. Alignment: Reversal of the expected cervical lordosis. No significant spondylolisthesis. Vertebrae: Vertebral body height is maintained. Within the limitations of motion degradation, no significant marrow edema or focal suspicious osseous lesion is identified. Degenerative endplate irregularity at C5-C6. Cord: Small T2 hyperintense lesions within the posterior aspect of the spinal cord at the C2-C3 level, unchanged from the prior examination of 03/14/2014.  No other definite lesions are identified within the cervical spinal cord, although motion degradation significantly limits evaluation. Within the limitations of motion degradation, no definite pathologic enhancement of the cervical spinal cord is appreciated. Posterior Fossa, vertebral arteries, paraspinal tissues: Posterior fossa better assessed on concurrently performed brain MRI. Flow voids preserved within the imaged cervical vertebral arteries. No paraspinal mass or collection. Disc levels: Unless otherwise stated, the level by level findings below have not significantly changed from the prior MRI of 03/14/2014. Progressive disc degeneration. Most notably, progressive mild-to-moderate disc degeneration is present at C5-C6. C2-C3: Slight disc bulge, new from the prior MRI. No significant spinal canal or foraminal stenosis. C3-C4: No significant disc herniation or stenosis. C4-C5: No significant disc herniation or stenosis. C5-C6: Disc bulge with endplate spurring and bilateral uncovertebral hypertrophy, progressed. The disc bulge mildly effaces the ventral thecal sac and may contact the ventral aspect of the spinal cord. Progressive bilateral neural foraminal narrowing (mild right, mild to moderate left). C6-C7: Shallow disc bulge, new from the prior MRI. Mild effacement of the ventral thecal sac (without spinal cord mass effect). No significant foraminal stenosis. C7-T1: No significant disc herniation or stenosis. MRI THORACIC SPINE FINDINGS The examination is intermittently motion degraded, limiting evaluation. Most notably, the sagittal T2 sequence is mild to moderately motion degraded, the axial T2 weighted sequences are moderately motion degraded, the sagittal T1 weighted post-contrast sequence is severely motion degraded and the axial T1 weighted post-contrast sequence is severely motion degraded. Alignment:  No significant spondylolisthesis. Vertebrae: Vertebral body height is maintained. No significant  marrow edema or focal suspicious osseous lesion. Cord: Short segment T2 hyperintense lesions (versus artifact) within the left aspect of the spinal cord at the T3 level (series 6, image 9) and within the anterior and right aspect of the spinal cord at the T11 level (series 6, image 33). No definite lesions are identified elsewhere within the spinal cord at the thoracic levels, although motion degradation significantly limits evaluation. Severely motion degraded postcontrast imaging, precluding evaluation for pathologic spinal cord enhancement at the thoracic levels. Paraspinal and other soft tissues: No abnormality is identified within included portions of the thorax or upper abdomen/retroperitoneum. No paraspinal mass or collection. Disc levels: No more than mild disc degeneration within the thoracic spine. Small multilevel disc bulges. No focal disc herniation, significant spinal canal stenosis or neural foraminal narrowing. IMPRESSION: MRI brain: 1. Significant intermittent motion degradation. 2. Extensive T2 FLAIR hyperintense signal abnormality within the supratentorial white matter, compatible with the provided history of multiple sclerosis. 13 x 8 mm peripherally enhancing lesion within the posterior right temporal lobe white matter, consistent with a site of active demyelination. 3 mm focus of enhancement within the posterior right frontal lobe white matter, likely reflecting an additional site of active demyelination. No other definitively new lesions are identified. 3. Age advanced parenchymal atrophy. 4. Paranasal sinus disease, as described. MRI cervical spine: 1. Significantly motion degraded examination. 2. Small T2 hyperintense lesions within the posterior aspect of  the spinal cord at the C2-C3 level, unchanged from the prior MRI of 03/14/2014. No definite lesions are identified elsewhere within the cervical spinal cord, although motion degradation significantly limits evaluation. No appreciable  pathologic spinal cord enhancement. 3. Progressive cervical spondylosis, greatest at C5-C6. MRI thoracic spine: 1. Significantly motion degraded examination. 2. Short-segment T2 hyperintense lesions (versus artifact) within the left aspect of the spinal cord at the T3 level, and within the ventral and right aspect of the spinal cord at the T11 level. Elsewhere, no definite lesions are identified within the thoracic spinal cord, although motion degradation significantly limits evaluation. Severe motion degradation of the post-contrast imaging precludes evaluation for pathologic spinal cord enhancement. 3. Thoracic spondylosis, as outlined. No significant spinal canal or foraminal stenosis. Electronically Signed   By: Kellie Simmering D.O.   On: 03/20/2022 14:22   MR Cervical Spine W or Wo Contrast  Result Date: 03/20/2022 CLINICAL DATA:  Provided history: Multiple sclerosis. MS flare up. EXAM: MRI HEAD WITHOUT AND WITH CONTRAST MRI CERVICAL SPINE WITHOUT AND WITH CONTRAST MRI THORACIC SPINE WITHOUT AND WITH CONTRAST CONTRAST:  60m GADAVIST GADOBUTROL 1 MMOL/ML IV SOLN TECHNIQUE: Multiplanar, multiecho pulse sequences of the brain and surrounding structures, and cervical and thoracic spine were obtained without and with intravenous contrast. COMPARISON:  Prior brain MRI examinations 02/08/2020 and earlier. Cervical spine MRI 03/14/2014. FINDINGS: MRI HEAD FINDINGS The examination is intermittently motion degraded. Most notably, there is severe motion degradation of the pre-contrast axial T1-weighted sequence and moderate-to-severe motion degradation of the coronal T2 sequence. Brain: Age advanced parenchymal atrophy. Severe patchy and confluent T2 FLAIR hyperintense signal abnormality within the cerebral white matter. Diffuse T2 FLAIR hyperintense signal abnormality and atrophy of the corpus callosum. 13 x 8 mm peripherally enhancing lesion within the posterior right temporal lobe white matter compatible with a site of  active demyelination. 3 mm focus of enhancement within the posterior right frontal lobe white matter, likely reflecting an additional site of active demyelination (series 21, image 15) (series 20, image 37). No new white matter lesion is identified elsewhere. There is no acute infarct. No evidence of an intracranial mass. No chronic intracranial blood products. No extra-axial fluid collection. No midline shift. Vascular: Maintained flow voids within the proximal large arterial vessels. Skull and upper cervical spine: No focal suspicious marrow lesion. Sinuses/Orbits: No mass or acute finding within the imaged orbits. 2 cm mucous retention cyst within the left maxillary sinus. Mild mucosal thickening within the left frontal sinus. MRI CERVICAL SPINE FINDINGS Intermittently motion degraded examination, limiting evaluation. Most notably, there is moderate to severe motion degradation of the sagittal T2 sequence, moderate to severe motion degradation of the sagittal STIR sequence, moderate motion degradation of the axial T2-weighted sequences and moderate motion degradation of the sagittal T1-weighted post-contrast sequence. Alignment: Reversal of the expected cervical lordosis. No significant spondylolisthesis. Vertebrae: Vertebral body height is maintained. Within the limitations of motion degradation, no significant marrow edema or focal suspicious osseous lesion is identified. Degenerative endplate irregularity at C5-C6. Cord: Small T2 hyperintense lesions within the posterior aspect of the spinal cord at the C2-C3 level, unchanged from the prior examination of 03/14/2014. No other definite lesions are identified within the cervical spinal cord, although motion degradation significantly limits evaluation. Within the limitations of motion degradation, no definite pathologic enhancement of the cervical spinal cord is appreciated. Posterior Fossa, vertebral arteries, paraspinal tissues: Posterior fossa better assessed  on concurrently performed brain MRI. Flow voids preserved within the imaged cervical vertebral arteries.  No paraspinal mass or collection. Disc levels: Unless otherwise stated, the level by level findings below have not significantly changed from the prior MRI of 03/14/2014. Progressive disc degeneration. Most notably, progressive mild-to-moderate disc degeneration is present at C5-C6. C2-C3: Slight disc bulge, new from the prior MRI. No significant spinal canal or foraminal stenosis. C3-C4: No significant disc herniation or stenosis. C4-C5: No significant disc herniation or stenosis. C5-C6: Disc bulge with endplate spurring and bilateral uncovertebral hypertrophy, progressed. The disc bulge mildly effaces the ventral thecal sac and may contact the ventral aspect of the spinal cord. Progressive bilateral neural foraminal narrowing (mild right, mild to moderate left). C6-C7: Shallow disc bulge, new from the prior MRI. Mild effacement of the ventral thecal sac (without spinal cord mass effect). No significant foraminal stenosis. C7-T1: No significant disc herniation or stenosis. MRI THORACIC SPINE FINDINGS The examination is intermittently motion degraded, limiting evaluation. Most notably, the sagittal T2 sequence is mild to moderately motion degraded, the axial T2 weighted sequences are moderately motion degraded, the sagittal T1 weighted post-contrast sequence is severely motion degraded and the axial T1 weighted post-contrast sequence is severely motion degraded. Alignment:  No significant spondylolisthesis. Vertebrae: Vertebral body height is maintained. No significant marrow edema or focal suspicious osseous lesion. Cord: Short segment T2 hyperintense lesions (versus artifact) within the left aspect of the spinal cord at the T3 level (series 6, image 9) and within the anterior and right aspect of the spinal cord at the T11 level (series 6, image 33). No definite lesions are identified elsewhere within the spinal  cord at the thoracic levels, although motion degradation significantly limits evaluation. Severely motion degraded postcontrast imaging, precluding evaluation for pathologic spinal cord enhancement at the thoracic levels. Paraspinal and other soft tissues: No abnormality is identified within included portions of the thorax or upper abdomen/retroperitoneum. No paraspinal mass or collection. Disc levels: No more than mild disc degeneration within the thoracic spine. Small multilevel disc bulges. No focal disc herniation, significant spinal canal stenosis or neural foraminal narrowing. IMPRESSION: MRI brain: 1. Significant intermittent motion degradation. 2. Extensive T2 FLAIR hyperintense signal abnormality within the supratentorial white matter, compatible with the provided history of multiple sclerosis. 13 x 8 mm peripherally enhancing lesion within the posterior right temporal lobe white matter, consistent with a site of active demyelination. 3 mm focus of enhancement within the posterior right frontal lobe white matter, likely reflecting an additional site of active demyelination. No other definitively new lesions are identified. 3. Age advanced parenchymal atrophy. 4. Paranasal sinus disease, as described. MRI cervical spine: 1. Significantly motion degraded examination. 2. Small T2 hyperintense lesions within the posterior aspect of the spinal cord at the C2-C3 level, unchanged from the prior MRI of 03/14/2014. No definite lesions are identified elsewhere within the cervical spinal cord, although motion degradation significantly limits evaluation. No appreciable pathologic spinal cord enhancement. 3. Progressive cervical spondylosis, greatest at C5-C6. MRI thoracic spine: 1. Significantly motion degraded examination. 2. Short-segment T2 hyperintense lesions (versus artifact) within the left aspect of the spinal cord at the T3 level, and within the ventral and right aspect of the spinal cord at the T11 level.  Elsewhere, no definite lesions are identified within the thoracic spinal cord, although motion degradation significantly limits evaluation. Severe motion degradation of the post-contrast imaging precludes evaluation for pathologic spinal cord enhancement. 3. Thoracic spondylosis, as outlined. No significant spinal canal or foraminal stenosis. Electronically Signed   By: Kellie Simmering D.O.   On: 03/20/2022 14:22  MR THORACIC SPINE W WO CONTRAST  Result Date: 03/20/2022 CLINICAL DATA:  Provided history: Multiple sclerosis. MS flare up. EXAM: MRI HEAD WITHOUT AND WITH CONTRAST MRI CERVICAL SPINE WITHOUT AND WITH CONTRAST MRI THORACIC SPINE WITHOUT AND WITH CONTRAST CONTRAST:  63m GADAVIST GADOBUTROL 1 MMOL/ML IV SOLN TECHNIQUE: Multiplanar, multiecho pulse sequences of the brain and surrounding structures, and cervical and thoracic spine were obtained without and with intravenous contrast. COMPARISON:  Prior brain MRI examinations 02/08/2020 and earlier. Cervical spine MRI 03/14/2014. FINDINGS: MRI HEAD FINDINGS The examination is intermittently motion degraded. Most notably, there is severe motion degradation of the pre-contrast axial T1-weighted sequence and moderate-to-severe motion degradation of the coronal T2 sequence. Brain: Age advanced parenchymal atrophy. Severe patchy and confluent T2 FLAIR hyperintense signal abnormality within the cerebral white matter. Diffuse T2 FLAIR hyperintense signal abnormality and atrophy of the corpus callosum. 13 x 8 mm peripherally enhancing lesion within the posterior right temporal lobe white matter compatible with a site of active demyelination. 3 mm focus of enhancement within the posterior right frontal lobe white matter, likely reflecting an additional site of active demyelination (series 21, image 15) (series 20, image 37). No new white matter lesion is identified elsewhere. There is no acute infarct. No evidence of an intracranial mass. No chronic intracranial blood  products. No extra-axial fluid collection. No midline shift. Vascular: Maintained flow voids within the proximal large arterial vessels. Skull and upper cervical spine: No focal suspicious marrow lesion. Sinuses/Orbits: No mass or acute finding within the imaged orbits. 2 cm mucous retention cyst within the left maxillary sinus. Mild mucosal thickening within the left frontal sinus. MRI CERVICAL SPINE FINDINGS Intermittently motion degraded examination, limiting evaluation. Most notably, there is moderate to severe motion degradation of the sagittal T2 sequence, moderate to severe motion degradation of the sagittal STIR sequence, moderate motion degradation of the axial T2-weighted sequences and moderate motion degradation of the sagittal T1-weighted post-contrast sequence. Alignment: Reversal of the expected cervical lordosis. No significant spondylolisthesis. Vertebrae: Vertebral body height is maintained. Within the limitations of motion degradation, no significant marrow edema or focal suspicious osseous lesion is identified. Degenerative endplate irregularity at C5-C6. Cord: Small T2 hyperintense lesions within the posterior aspect of the spinal cord at the C2-C3 level, unchanged from the prior examination of 03/14/2014. No other definite lesions are identified within the cervical spinal cord, although motion degradation significantly limits evaluation. Within the limitations of motion degradation, no definite pathologic enhancement of the cervical spinal cord is appreciated. Posterior Fossa, vertebral arteries, paraspinal tissues: Posterior fossa better assessed on concurrently performed brain MRI. Flow voids preserved within the imaged cervical vertebral arteries. No paraspinal mass or collection. Disc levels: Unless otherwise stated, the level by level findings below have not significantly changed from the prior MRI of 03/14/2014. Progressive disc degeneration. Most notably, progressive mild-to-moderate disc  degeneration is present at C5-C6. C2-C3: Slight disc bulge, new from the prior MRI. No significant spinal canal or foraminal stenosis. C3-C4: No significant disc herniation or stenosis. C4-C5: No significant disc herniation or stenosis. C5-C6: Disc bulge with endplate spurring and bilateral uncovertebral hypertrophy, progressed. The disc bulge mildly effaces the ventral thecal sac and may contact the ventral aspect of the spinal cord. Progressive bilateral neural foraminal narrowing (mild right, mild to moderate left). C6-C7: Shallow disc bulge, new from the prior MRI. Mild effacement of the ventral thecal sac (without spinal cord mass effect). No significant foraminal stenosis. C7-T1: No significant disc herniation or stenosis. MRI THORACIC SPINE FINDINGS The  examination is intermittently motion degraded, limiting evaluation. Most notably, the sagittal T2 sequence is mild to moderately motion degraded, the axial T2 weighted sequences are moderately motion degraded, the sagittal T1 weighted post-contrast sequence is severely motion degraded and the axial T1 weighted post-contrast sequence is severely motion degraded. Alignment:  No significant spondylolisthesis. Vertebrae: Vertebral body height is maintained. No significant marrow edema or focal suspicious osseous lesion. Cord: Short segment T2 hyperintense lesions (versus artifact) within the left aspect of the spinal cord at the T3 level (series 6, image 9) and within the anterior and right aspect of the spinal cord at the T11 level (series 6, image 33). No definite lesions are identified elsewhere within the spinal cord at the thoracic levels, although motion degradation significantly limits evaluation. Severely motion degraded postcontrast imaging, precluding evaluation for pathologic spinal cord enhancement at the thoracic levels. Paraspinal and other soft tissues: No abnormality is identified within included portions of the thorax or upper  abdomen/retroperitoneum. No paraspinal mass or collection. Disc levels: No more than mild disc degeneration within the thoracic spine. Small multilevel disc bulges. No focal disc herniation, significant spinal canal stenosis or neural foraminal narrowing. IMPRESSION: MRI brain: 1. Significant intermittent motion degradation. 2. Extensive T2 FLAIR hyperintense signal abnormality within the supratentorial white matter, compatible with the provided history of multiple sclerosis. 13 x 8 mm peripherally enhancing lesion within the posterior right temporal lobe white matter, consistent with a site of active demyelination. 3 mm focus of enhancement within the posterior right frontal lobe white matter, likely reflecting an additional site of active demyelination. No other definitively new lesions are identified. 3. Age advanced parenchymal atrophy. 4. Paranasal sinus disease, as described. MRI cervical spine: 1. Significantly motion degraded examination. 2. Small T2 hyperintense lesions within the posterior aspect of the spinal cord at the C2-C3 level, unchanged from the prior MRI of 03/14/2014. No definite lesions are identified elsewhere within the cervical spinal cord, although motion degradation significantly limits evaluation. No appreciable pathologic spinal cord enhancement. 3. Progressive cervical spondylosis, greatest at C5-C6. MRI thoracic spine: 1. Significantly motion degraded examination. 2. Short-segment T2 hyperintense lesions (versus artifact) within the left aspect of the spinal cord at the T3 level, and within the ventral and right aspect of the spinal cord at the T11 level. Elsewhere, no definite lesions are identified within the thoracic spinal cord, although motion degradation significantly limits evaluation. Severe motion degradation of the post-contrast imaging precludes evaluation for pathologic spinal cord enhancement. 3. Thoracic spondylosis, as outlined. No significant spinal canal or foraminal  stenosis. Electronically Signed   By: Kellie Simmering D.O.   On: 03/20/2022 14:22    Scheduled Meds:  dalfampridine  10 mg Oral BID   Dimethyl Fumarate  240 mg Oral BID   enoxaparin (LOVENOX) injection  40 mg Subcutaneous Q24H   FLUoxetine  20 mg Oral Daily   gabapentin  300 mg Oral BID   pantoprazole  40 mg Oral BID AC   rosuvastatin  5 mg Oral QHS   Continuous Infusions:  cefTRIAXone (ROCEPHIN)  IV 1 g (03/21/22 1540)   methylPREDNISolone (SOLU-MEDROL) injection 1,000 mg (03/21/22 1435)    LOS: 2 days   Time spent: 35 mins  [place] Wynetta Emery, MD How to contact the [facility] Attending or Consulting provider [place] or covering provider during after hours [place], for this patient?  Check the care team in [facility] and look for a) attending/consulting [facility] provider listed and b) the [facility] team listed Log into [email or web address]  and use [place]'s universal password to access. If you do not have the password, please contact the hospital operator. Locate the [facility] provider you are looking for under [facility] and page to a number that you can be directly reached. If you still have difficulty reaching the provider, please page the [facility] (Director on Call) for the Hospitalists listed on amion for assistance.  03/22/2022, 12:29 PM

## 2022-03-23 DIAGNOSIS — G35 Multiple sclerosis: Secondary | ICD-10-CM | POA: Diagnosis not present

## 2022-03-23 DIAGNOSIS — E785 Hyperlipidemia, unspecified: Secondary | ICD-10-CM | POA: Diagnosis not present

## 2022-03-23 DIAGNOSIS — E876 Hypokalemia: Secondary | ICD-10-CM | POA: Diagnosis not present

## 2022-03-23 LAB — GLUCOSE, CAPILLARY
Glucose-Capillary: 140 mg/dL — ABNORMAL HIGH (ref 70–99)
Glucose-Capillary: 217 mg/dL — ABNORMAL HIGH (ref 70–99)
Glucose-Capillary: 164 mg/dL — ABNORMAL HIGH (ref 70–99)
Glucose-Capillary: 152 mg/dL — ABNORMAL HIGH (ref 70–99)

## 2022-03-23 MED ORDER — SODIUM CHLORIDE 0.9 % IV SOLN
1000.0000 mg | Freq: Every day | INTRAVENOUS | Status: AC
  Administered 2022-03-23 – 2022-03-24 (×2): 1000 mg via INTRAVENOUS
  Filled 2022-03-23 (×2): qty 16

## 2022-03-23 NOTE — Plan of Care (Signed)
  Problem: Acute Rehab OT Goals (only OT should resolve) Goal: Pt. Will Perform Grooming Flowsheets (Taken 03/23/2022 1558) Pt Will Perform Grooming:  with modified independence  sitting Goal: Pt. Will Perform Upper Body Dressing Flowsheets (Taken 03/23/2022 1558) Pt Will Perform Upper Body Dressing:  with supervision  sitting Goal: Pt. Will Transfer To Toilet Flowsheets (Taken 03/23/2022 1558) Pt Will Transfer to Toilet:  with min guard assist  stand pivot transfer  bedside commode Goal: Pt. Will Perform Toileting-Clothing Manipulation Flowsheets (Taken 03/23/2022 1558) Pt Will Perform Toileting - Clothing Manipulation and hygiene:  with min guard assist  sitting/lateral leans  sit to/from stand Goal: Pt/Caregiver Will Perform Home Exercise Program Flowsheets (Taken 03/23/2022 1558) Pt/caregiver will Perform Home Exercise Program:  Increased strength  Both right and left upper extremity  Independently  With written HEP provided

## 2022-03-23 NOTE — Progress Notes (Signed)
Patient stable no issues overnight, slept well. Patient stated her family was suppose to be bringing her more depends 03/23/22 from home.

## 2022-03-23 NOTE — Consult Note (Addendum)
I connected with  Erin Berg on 03/23/22 by a video enabled telemedicine application and verified that I am speaking with the correct person using two identifiers.   I discussed the limitations of evaluation and management by telemedicine. The patient expressed understanding and agreed to proceed.   Location of patient: [facility] Location of physician: [facility]   Neurology Consultation Reason for Consult: Multiple sclerosis exacerbation Referring Physician: Leanna Battles, PA  CC: Weakness in the legs  History is obtained from: Patient, chart review  HPI: Erin Berg is a 56 y.o. female with past medical history of multiple sclerosis, wheelchair-bound who presented with weakness and cramping in bilateral lower extremity.  Patient states she was diagnosed with multiple sclerosis in 1996 and has been wheelchair-bound for the last several years.  States she has been on Tecfidera for very long time and denies any recent exacerbations.  However states for the last 3 weeks, she felt weak in bilateral lower extremity which led to fall from her bed.  She contacted her primary care physician who gave her 2 steroid packs.  However on Friday her symptoms persisted and she had significant cramping in bilateral lower extremities.  Therefore she presented to the emergency room.  Denies any vision changes, tingling, numbness, weakness in arms, trouble breathing, chest pain.  Patient has received 3 doses of IV Solu-Medrol and states her symptoms have somewhat improved but continues to have cramping.  ROS: All other systems reviewed and negative except as noted in the HPI.   Past Medical History:  Diagnosis Date   Anxiety and depression    BMI 24.0-24.9, adult    MS (multiple sclerosis) ([place])    Multiple sclerosis ([place])     Family History  Problem Relation Age of Onset   Breast cancer Mother    Colon cancer Maternal Grandmother    Breast cancer Paternal Grandmother      Social History:  reports that she has quit smoking. Her smoking use included cigarettes. She has never used smokeless tobacco. She reports that she does not drink alcohol and does not use drugs.  Medications Prior to Admission  Medication Sig Dispense Refill Last Dose   acetaminophen (TYLENOL) 500 MG tablet Take 1,000 mg by mouth every 6 (six) hours as needed for moderate pain.   03/19/2022   dalfampridine 10 MG TB12 Take 1 tablet by mouth 2 (two) times daily.   03/20/2022   FLUoxetine (PROZAC) 20 MG capsule Take 20 mg by mouth daily.   03/20/2022   gabapentin (NEURONTIN) 300 MG capsule Take 300 mg by mouth 2 (two) times daily.   03/19/2022   predniSONE (DELTASONE) 10 MG tablet Take 4 tablets by mouth daily. 4tabs x 3days,3tabs x3days, 2tabs x3days 1tab   03/20/2022   rosuvastatin (CRESTOR) 5 MG tablet Take 5 mg by mouth at bedtime.   03/19/2022   TECFIDERA 240 MG CPDR Take 240 mg by mouth 2 (two) times daily.   03/20/2022   OVER THE COUNTER MEDICATION Take 1 tablet by mouth daily. 8 Greens otc supplement         Exam: Current vital signs: BP (!) 154/83 (BP Location: Left Arm)   Pulse 71   Temp 98.1 F (36.7 C) (Oral)   Resp 18   Ht '5\' 5"'$  (1.651 m)   Wt 97.1 kg   LMP 08/01/2019 (Exact Date)   SpO2 97%   BMI 35.62 kg/m  Vital signs in last 24 hours: Temp:  [97.9 F (  36.6 C)-98.1 F (36.7 C)] 98.1 F (36.7 C) (08/07 0510) Pulse Rate:  [71-93] 71 (08/07 0510) Resp:  [18] 18 (08/07 0510) BP: (139-158)/(77-83) 154/83 (08/07 0510) SpO2:  [96 %-97 %] 97 % (08/07 0510)   Physical Exam  Constitutional: Appears well-developed and well-nourished.  Psych: Affect appropriate to situation Eyes: No scleral injection Neuro: AOx3, PERRLA, EOMI, cranial nerves II to XII appear grossly intact, antigravity strength in all extremities without drift, FTN intact bilaterally, sensory intact to light touch  I have reviewed labs in epic and the results pertinent to this consultation are: CBC:  Recent  Labs  Lab 03/20/22 1111 03/21/22 0421  WBC 7.0 9.4  NEUTROABS 5.1  --   HGB 13.7 12.7  HCT 42.3 40.2  MCV 83.1 84.3  PLT 249 761    Basic Metabolic Panel:  Lab Results  Component Value Date   NA 139 03/21/2022   K 4.6 03/21/2022   CO2 28 03/21/2022   GLUCOSE 154 (H) 03/21/2022   BUN 14 03/21/2022   CREATININE 0.53 03/21/2022   CALCIUM 9.6 03/21/2022   GFRNONAA >60 03/21/2022   Lipid Panel: No results found for: "LDLCALC" HgbA1c: No results found for: "HGBA1C" Urine Drug Screen: No results found for: "LABOPIA", "COCAINSCRNUR", "LABBENZ", "AMPHETMU", "THCU", "LABBARB"  Alcohol Level No results found for: "ETH"   I have reviewed the images obtained:  MRI brain with and without contrast 03/20/2022:   1. Significant intermittent motion degradation. 2. Extensive T2 FLAIR hyperintense signal abnormality within the supratentorial white matter, compatible with the provided history of multiple sclerosis. 13 x 8 mm peripherally enhancing lesion within the posterior right temporal lobe white matter, consistent with a site of active demyelination. 3 mm focus of enhancement within the posterior right frontal lobe white matter, likely reflecting an additional site of active demyelination. No other definitively new lesions are identified. 3. Age advanced parenchymal atrophy. 4. Paranasal sinus disease, as described.   MRI cervical spine with and without contrast 03/20/2022:   1. Significantly motion degraded examination. 2. Small T2 hyperintense lesions within the posterior aspect of the spinal cord at the C2-C3 level, unchanged from the prior MRI of 03/14/2014. No definite lesions are identified elsewhere within the cervical spinal cord, although motion degradation significantly limits evaluation. No appreciable pathologic spinal cord enhancement. 3. Progressive cervical spondylosis, greatest at C5-C6.   MRI thoracic spine with and without contrast 03/20/2022:   1. Significantly  motion degraded examination. 2. Short-segment T2 hyperintense lesions (versus artifact) within the left aspect of the spinal cord at the T3 level, and within the ventral and right aspect of the spinal cord at the T11 level. Elsewhere, no definite lesions are identified within the thoracic spinal cord, although motion degradation significantly limits evaluation. Severe motion degradation of the post-contrast imaging precludes evaluation for pathologic spinal cord enhancement. 3. Thoracic spondylosis, as outlined. No significant spinal canal or foraminal stenosis.    ASSESSMENT/PLAN: 56 year old female with history of multiple sclerosis who presented with exacerbation.  Multiple sclerosis exacerbation Muscle spasm, legs -MRI brain with active demyelinating lesions in right posterior temporal lobe and right frontal lobe.  -Urine culture showed more than 100,000 colonies of E. coli and Strep Gallocyticus  Recommendations: -The location of these lesions is unlikely to cause patient's weakness.  However, due to her significant disease burden and active areas of demyelination, will complete 5 days of IV steroids -Patient also requested referral to Dr. Felecia Shelling at [facility].  Referral has been placed by medicine team -  Continue PPI and glucose monitoring while on high-dose steroids -On gabapentin 300 mg twice daily, this can be increased to 300 mg 3 times daily if muscle spasms persist -Continue PT/OT -Defer management of UTI to primary team -Plan communicated with patient and Dr. Wynetta Emery   Thank you for allowing [place] to participate in the care of this patient. If you have any further questions, please contact  me or neurohospitalist.   Zeb Comfort Epilepsy [facility]

## 2022-03-23 NOTE — Evaluation (Signed)
Occupational Therapy Evaluation Patient Details Name: KELLEY POLINSKY MRN: [id number] DOB: 21-Oct-1965 Today's Date: 03/23/2022   History of Present Illness Analaura Messler  is a 56 y.o. female, with past medical history of MS, hyperlipidemia, presents to ED secondary to suspicion for MS flare, she presents mainly today with lower extremity pain, but upon further questioning does appear she has been having progressive weakness over the last 2 to 3 weeks, she is wheelchair dependent, but able to ambulate from bed to chair, but this has been progressively weak over the last few weeks, as well she had multiple falls recently per her father at bedside, she denies nausea, vomiting, fever, chills, polyuria or dysuria, she denies any double vision, blurry vision or headache.  -In ED labs were significant for potassium of 3.2, her MRI brain/cervical/thoracic spine were significant for multiple enhancing lesion, neurology recommended admission for pulse steroids, [facility] consulted to admit   Clinical Impression   Pt agreeable to OT evaluation this afternoon. Pt reports she is feeling stronger, was able to sit at EOB, stand and side step to top bedrail, cuing for standing tall versus going into posterior lean. Pt demonstrates BUE weakness, reports baseline. Pt requires assistance for all ADLs, has aides 6 days per week. Recommend HH OT on discharge to assess home environment and assist with strategies for improving safety at home.       Recommendations for follow up therapy are one component of a multi-disciplinary discharge planning process, led by the attending physician.  Recommendations may be updated based on patient status, additional functional criteria and insurance authorization.   Follow Up Recommendations  Home health OT    Assistance Recommended at Discharge Frequent or constant Supervision/Assistance  Patient can return home with the following A lot of help with walking and/or  transfers;A lot of help with bathing/dressing/bathroom;Assistance with cooking/housework;Assistance with feeding;Assist for transportation;Help with stairs or ramp for entrance    Functional Status Assessment  Patient has had a recent decline in their functional status and demonstrates the ability to make significant improvements in function in a reasonable and predictable amount of time.  Equipment Recommendations  BSC/3in1    Recommendations for Other Services       Precautions / Restrictions Precautions Precautions: Fall Restrictions Weight Bearing Restrictions: No      Mobility Bed Mobility Overal bed mobility: Needs Assistance Bed Mobility: Supine to Sit, Sit to Supine     Supine to sit: Supervision Sit to supine: Supervision        Transfers Overall transfer level: Needs assistance Equipment used: Rolling walker (2 wheels) Transfers: Sit to/from Stand Sit to Stand: Min assist          Lateral/Scoot Transfers: Min assist (using RW side stepping)            ADL either performed or assessed with clinical judgement   ADL Overall ADL's : Needs assistance/impaired     Grooming: Set up;Sitting   Upper Body Bathing: Minimal assistance;Sitting   Lower Body Bathing: Moderate assistance;Sitting/lateral leans;Sit to/from stand   Upper Body Dressing : Minimal assistance;Sitting   Lower Body Dressing: Maximal assistance;Sitting/lateral leans;Sit to/from stand   Toilet Transfer: Minimal assistance;Stand-pivot;BSC/3in1 Armed forces technical officer Details (indicate cue type and reason): simulated with bed to chair transfer [place] and Hygiene: Moderate assistance;Sitting/lateral lean;Sit to/from stand         General ADL Comments: Pt requiring assist with ADLs at baseline, has aides 6 days per week     Vision Baseline Vision/History:  1 Wears glasses Ability to See in Adequate Light: 0 Adequate Patient Visual Report: No change from  baseline Vision Assessment?: No apparent visual deficits            Pertinent Vitals/Pain Pain Assessment Pain Assessment: No/denies pain     Hand Dominance Right   Extremity/Trunk Assessment Upper Extremity Assessment Upper Extremity Assessment: Generalized weakness   Lower Extremity Assessment Lower Extremity Assessment: Defer to PT evaluation   Cervical / Trunk Assessment Cervical / Trunk Assessment: Normal   Communication Communication Communication: No difficulties   Cognition Arousal/Alertness: Awake/alert Behavior During Therapy: WFL for tasks assessed/performed Overall Cognitive Status: Within Functional Limits for tasks assessed                                                  Home Living Family/patient expects to be discharged to:: Private residence Living Arrangements: Children (son) Available Help at Discharge: Family;Personal care attendant;Available PRN/intermittently Type of Home: House Home Access: Ramped entrance     Home Layout: One level     Bathroom Shower/Tub: Teacher, early years/pre: Handicapped height     Home Equipment: Conservation officer, nature (2 wheels);Wheelchair - manual;Shower seat;Grab bars - toilet;Grab bars - tub/shower          Prior Functioning/Environment Prior Level of Function : Needs assist       Physical Assist : Mobility (physical);ADLs (physical) Mobility (physical): Bed mobility;Transfers;Gait ADLs (physical): Dressing;Bathing;Toileting;Grooming   ADLs Comments: home aides from 7:am to 1:pm on MWF,  T/Th, from  9:am to 3:pm, and 4 hours on Sat, 41 y/o son also assist        OT Problem List: Decreased strength;Decreased activity tolerance;Impaired balance (sitting and/or standing);Decreased coordination;Decreased safety awareness;Decreased knowledge of use of DME or AE;Impaired UE functional use      OT Treatment/Interventions: Self-care/ADL training;Therapeutic exercise;Neuromuscular  education;DME and/or AE instruction;Therapeutic activities;Patient/family education    OT Goals(Current goals can be found in the care plan section) Acute Rehab OT Goals Patient Stated Goal: To get stronger and go home OT Goal Formulation: With patient Time For Goal Achievement: 04/06/22 Potential to Achieve Goals: Good  OT Frequency: Min 1X/week          End of Session Equipment Utilized During Treatment: Rolling walker (2 wheels)  Activity Tolerance: Patient tolerated treatment well Patient left: in bed;with call bell/phone within reach  OT Visit Diagnosis: Repeated falls (R29.6);Muscle weakness (generalized) (M62.81)                Time: 4332-9518 OT Time Calculation (min): 17 min Charges:  OT General Charges $OT Visit: 1 Visit OT Evaluation $OT Eval Low Complexity: [place], OTR/L  [phone] 03/23/2022, 2:11 PM

## 2022-03-23 NOTE — TOC Initial Note (Signed)
Transition of Care [facility]) - Initial/Assessment Note    Patient Details  Name: Erin Berg MRN: [id number] Date of Birth: Jul 08, 1966  Transition of Care [facility]) CM/SW Contact:    Shade Flood, LCSW Phone Number: 03/23/2022, 2:38 PM  Clinical Narrative:                  Pt admitted from home. PT recommending HHPT at dc. Met with pt at bedside today to assess and review dc planning. Pt states she lives alone and will return home at dc. Pt reports that she has a BSC, Walker, wheelchair, bed rails, and lift chair for DME at home. Pt requesting a tub transfer bench at dc. CMS provider options reviewed. Referred to [employer] and bench will be drop shipped to pt's home.  Pt has an Aide with her for six hours M-F and for four hours on Saturday. Pt's aide takes her shopping, to appointments, etc. Per pt, she is able to obtain medications without difficulty.  Pt agreeable to HHPT at dc. CMS provider options reviewed. Referred as requested.  MD anticipating dc in 1-2 days. TOC will follow.  Expected Discharge Plan: [place] Barriers to Discharge: Continued Medical Work up   Patient Goals and CMS Choice Patient states their goals for this hospitalization and ongoing recovery are:: go home CMS Medicare.gov Compare Post Acute Care list provided to:: Patient Choice offered to / list presented to : Patient  Expected Discharge Plan and Services Expected Discharge Plan: [place] In-house Referral: Clinical Social Work   Post Acute Care Choice: [place] arrangements for the past 2 months: [place]                 DME Arranged: Tub bench DME Agency: [employer] Date DME Agency Contacted: 03/23/22   Representative spoke with at DME Agency: Thedore Mins HH Arranged: PT, OT [place] Agency: [facility] Date [place]: 03/23/22   Representative spoke with at [place]: Sarah  Prior Living Arrangements/Services Living arrangements  for the past 2 months: [place] with:: Self Patient language and need for interpreter reviewed:: Yes Do you feel safe going back to the place where you live?: Yes      Need for Family Participation in Patient Care: No (Comment) Care giver support system in place?: Yes (comment) Current home services: DME, Other (comment) (PCS) Criminal Activity/Legal Involvement Pertinent to Current Situation/Hospitalization: No - Comment as needed  Activities of Daily Living Home Assistive Devices/Equipment: Wheelchair ADL Screening (condition at time of admission) Patient's cognitive ability adequate to safely complete daily activities?: Yes Is the patient deaf or have difficulty hearing?: No Does the patient have difficulty seeing, even when wearing glasses/contacts?: No Does the patient have difficulty concentrating, remembering, or making decisions?: Yes Patient able to express need for assistance with ADLs?: Yes Does the patient have difficulty dressing or bathing?: Yes Independently performs ADLs?: Yes (appropriate for developmental age) Does the patient have difficulty walking or climbing stairs?: Yes Weakness of Legs: Both Weakness of Arms/Hands: None  Permission Sought/Granted Permission sought to share information with : Chartered certified accountant granted to share information with : Yes, Verbal Permission Granted     Permission granted to share info w AGENCY: HH/DME        Emotional Assessment Appearance:: Appears younger than stated age Attitude/Demeanor/Rapport: Engaged Affect (typically observed): Pleasant Orientation: : Oriented to Self, Oriented to Place, Oriented to  Time, Oriented to Situation  Alcohol / Substance Use: Not Applicable Psych Involvement: No (comment)  Admission diagnosis:  Multiple sclerosis ([place]) [G35] Multiple sclerosis exacerbation ([place]) [G35] Patient Active Problem List   Diagnosis Date Noted   Multiple sclerosis  exacerbation ([place]) 03/20/2022   Hyperlipidemia 03/20/2022   Hypokalemia 03/20/2022   Pyuria 03/20/2022   Taking multiple medications for chronic disease 10/17/2018   ALLERGIC RHINITIS 10/14/2007   PCP:  Redmond School, MD Pharmacy:   [facility] [place] [address] [place] [address] Phone: [phone] Fax: [phone]     Social Determinants of Health (SDOH) Interventions    Readmission Risk Interventions    03/23/2022    2:06 PM  Readmission Risk Prevention Plan  Medication Screening Complete  Transportation Screening Complete

## 2022-03-23 NOTE — Progress Notes (Signed)
PROGRESS NOTE   TEAH VOTAW  BJY:[id number] DOB: Nov 25, 1965 DOA: 03/20/2022 PCP: Redmond School, MD   Chief Complaint  Patient presents with   Leg Pain   Level of care: Med-Surg  Brief Admission History:  56 y.o. female, with past medical history of MS, hyperlipidemia, presents to ED secondary to suspicion for MS flare, she presents mainly today with lower extremity pain, but upon further questioning does appear she has been having progressive weakness over the last 2 to 3 weeks, she is wheelchair dependent, but able to ambulate from bed to chair, but this has been progressively weak over the last few weeks, as well she had multiple falls recently per her father at bedside, she denies nausea, vomiting, fever, chills, polyuria or dysuria, she denies any double vision, blurry vision or headache. -In ED labs were significant for potassium of 3.2, her MRI brain/cervical/thoracic spine were significant for multiple enhancing lesion, neurology recommended admission for pulse steroids, [facility] hospitalist consulted to admit   Assessment and Plan:  Acute MS exacerbation -Patient with known history of MS, she is followed by Dr. Merlene Laughter as an outpatient but has not seen him in 5-6 years but seen by his PA.  -Her MRI of brain/thoracic/Lumbar spine significant for multiple enhancing lesions. -Neurology consult greatly appreciated, patient will be admitted for pulse dose steroids, initial recommendation for 3 days, and to be reassessed on Monday by neurology to see if needs to extend for 5 days. -Continue with Protonix 40 mg twice daily while on pulse dose steroids -Monitor CBG and start insulin if needed due to steroids -PT recommending home health services.  -Pt requested referral to Dr. Felecia Shelling with [facility].  She says she hasn't seen Dr. Merlene Laughter in 6 years and has only seen the PA in last 6 years. Ambulatory referral placed on 8/6.  -discussed with neurology, pt to complete 5 full  treatments of high dose steroid which last dose should complete on 8/8.  Possible DC home after last IV steroid treatment.      Leg pain  - due to MS exacerbation, will keep on as needed Tylenol, oxycodone and morphine, and will start on as needed Robaxin - CK is normal -pain is resolving and mobility is much improved.    Hyperlipidemia -Resume statin    E coli/Step Gallolyticus UTI -pansensitive, treated with IV ceftriaxone   DVT prophylaxis: enoxaparin Code Status: Full  Family Communication:  Disposition: anticipating  home tomorrow  Remains inpatient appropriate because: high dose IV steroids    Consultants:  Neurology  Procedures:   Antimicrobials:    Subjective: Pt reports improvement in strength of lower legs.     Objective: Vitals:   03/22/22 1349 03/22/22 2056 03/23/22 0510 03/23/22 1226  BP: (!) 158/83 139/77 (!) 154/83 (!) 143/87  Pulse: 93 75 71 84  Resp: '18 18 18 18  '$ Temp: 97.9 F (36.6 C) 98 F (36.7 C) 98.1 F (36.7 C) 98.7 F (37.1 C)  TempSrc: Oral Oral Oral Oral  SpO2: 96% 97% 97% 97%  Weight:      Height:        Intake/Output Summary (Last 24 hours) at 03/23/2022 1254 Last data filed at 03/23/2022 0900 Gross per 24 hour  Intake 1200 ml  Output 2050 ml  Net -850 ml   Filed Weights   03/20/22 1744  Weight: 97.1 kg   Examination:  General exam: Appears calm and comfortable  Respiratory system: Clear to auscultation. Respiratory effort normal. Cardiovascular system: normal S1 &  S2 heard. No JVD, murmurs, rubs, gallops or clicks. No pedal edema. Gastrointestinal system: Abdomen is nondistended, soft and nontender. No organomegaly or masses felt. Normal bowel sounds heard. Central nervous system: Alert and oriented.  Extremities: trace edema. Skin: No rashes, lesions or ulcers. Psychiatry: Judgement and insight appear normal. Mood & affect appropriate.   Data Reviewed: I have personally reviewed following labs and imaging  studies  CBC: Recent Labs  Lab 03/20/22 1111 03/21/22 0421  WBC 7.0 9.4  NEUTROABS 5.1  --   HGB 13.7 12.7  HCT 42.3 40.2  MCV 83.1 84.3  PLT 249 852    Basic Metabolic Panel: Recent Labs  Lab 03/20/22 1111 03/21/22 0421  NA 141 139  K 3.2* 4.6  CL 106 104  CO2 27 28  GLUCOSE 97 154*  BUN 19 14  CREATININE 0.52 0.53  CALCIUM 9.6 9.6    CBG: Recent Labs  Lab 03/22/22 0751 03/22/22 1129 03/22/22 1645 03/23/22 0752 03/23/22 1108  GLUCAP 160* 122* 168* 140* 217*    Recent Results (from the past 240 hour(s))  Urine Culture     Status: Abnormal (Preliminary result)   Collection Time: 03/20/22  3:24 PM   Specimen: Urine, Clean Catch  Result Value Ref Range Status   Specimen Description URINE, CLEAN CATCH  Final   Special Requests Immunocompromised  Final   Culture (A)  Final    >=100,000 COLONIES/mL ESCHERICHIA COLI >=100,000 COLONIES/mL STREPTOCOCCUS GALLOLYTICUS SUSCEPTIBILITIES TO FOLLOW CULTURE REINCUBATED FOR BETTER GROWTH    Report Status PENDING  Incomplete   Organism ID, Bacteria ESCHERICHIA COLI (A)  Final      Susceptibility   Escherichia coli - MIC*    AMPICILLIN <=2 SENSITIVE Sensitive     CEFAZOLIN <=4 SENSITIVE Sensitive     CEFEPIME <=0.12 SENSITIVE Sensitive     CEFTRIAXONE <=0.25 SENSITIVE Sensitive     CIPROFLOXACIN <=0.25 SENSITIVE Sensitive     GENTAMICIN <=1 SENSITIVE Sensitive     IMIPENEM <=0.25 SENSITIVE Sensitive     NITROFURANTOIN <=16 SENSITIVE Sensitive     TRIMETH/SULFA <=20 SENSITIVE Sensitive     AMPICILLIN/SULBACTAM <=2 SENSITIVE Sensitive     PIP/TAZO Value in next row Sensitive      <=4 SENSITIVEPerformed at [facility], [address] [place], [place] [address]    * >=100,000 COLONIES/mL ESCHERICHIA COLI     Radiology Studies: No results found.  Scheduled Meds:  dalfampridine  10 mg Oral BID   Dimethyl Fumarate  240 mg Oral BID   enoxaparin (LOVENOX) injection  40 mg Subcutaneous Q24H   FLUoxetine  20  mg Oral Daily   gabapentin  300 mg Oral BID   pantoprazole  40 mg Oral BID AC   rosuvastatin  5 mg Oral QHS   Continuous Infusions:  methylPREDNISolone (SOLU-MEDROL) injection 1,000 mg (03/23/22 1117)    LOS: 3 days   Time spent: 35 mins  [place]ba Stevens Wynetta Emery, MD How to contact the [facility] Attending or Consulting provider [place] or covering provider during after hours [place], for this patient?  Check the care team in [facility] and look for a) attending/consulting TRH provider listed and b) the [facility] team listed Log into [email or web address] and use [place] universal password to access. If you do not have the password, please contact the hospital operator. Locate the [facility] provider you are looking for under [facility] and page to a number that you can be directly reached. If you still have difficulty reaching the  provider, please page the [facility] (Director on Call) for the Hospitalists listed on amion for assistance.  03/23/2022, 12:54 PM

## 2022-03-24 DIAGNOSIS — E876 Hypokalemia: Secondary | ICD-10-CM | POA: Diagnosis not present

## 2022-03-24 DIAGNOSIS — E785 Hyperlipidemia, unspecified: Secondary | ICD-10-CM | POA: Diagnosis not present

## 2022-03-24 DIAGNOSIS — G35 Multiple sclerosis: Secondary | ICD-10-CM | POA: Diagnosis not present

## 2022-03-24 LAB — URINE CULTURE: Culture: >=100000 — AB

## 2022-03-24 LAB — GLUCOSE, CAPILLARY
Glucose-Capillary: 134 mg/dL — ABNORMAL HIGH (ref 70–99)
Glucose-Capillary: 128 mg/dL — ABNORMAL HIGH (ref 70–99)

## 2022-03-24 NOTE — Discharge Summary (Signed)
Physician Discharge Summary  Erin Berg JXB:[id number] DOB: [birth year] DOA: 03/20/2022  PCP: Redmond School, MD  Admit date: 03/20/2022 Discharge date: 03/24/2022  Admitted From:  Home  Disposition: Home with home health   Recommendations for Outpatient Follow-up:  Follow up with PCP in 1 weeks Follow up with neurologist in 3 weeks Amb referral made to [facility] Neurology (Dr. Felecia Shelling)  Home Health:  PT/OT   Discharge Condition: STABLE   CODE STATUS: FULL DIET: resume prior home diet    Brief Hospitalization Summary: Please see all hospital notes, images, labs for full details of the hospitalization. Brief Admission History:  56 y.o. female, with past medical history of MS, hyperlipidemia, presents to ED secondary to suspicion for MS flare, she presents mainly today with lower extremity pain, but upon further questioning does appear she has been having progressive weakness over the last 2 to 3 weeks, she is wheelchair dependent, but able to ambulate from bed to chair, but this has been progressively weak over the last few weeks, as well she had multiple falls recently per her father at bedside, she denies nausea, vomiting, fever, chills, polyuria or dysuria, she denies any double vision, blurry vision or headache. -In ED labs were significant for potassium of 3.2, her MRI brain/cervical/thoracic spine were significant for multiple enhancing lesion, neurology recommended admission for pulse steroids, [facility] hospitalist consulted to admit   Assessment and Plan:   Acute MS exacerbation -Patient with known history of MS, she is followed by Dr. Merlene Laughter as an outpatient but has not seen him in 5-6 years but seen by his PA.  -Her MRI of brain/thoracic/Lumbar spine significant for multiple enhancing lesions. -Neurology consult greatly appreciated, patient will be admitted for pulse dose steroids, initial recommendation for 3 days, and extended to 5 days by Dr. Hortense Ramal. -Pt was given Protonix 40  mg twice daily while on high dose steroids -Monitored CBG closely while on steroids but remained stable.  -PT recommending home health services which is ordered.   -Pt requested referral to Dr. Felecia Shelling with [facility].  She says she hasn't seen Dr. Merlene Laughter in 6 years and has only seen the PA in last 6 years. Ambulatory referral placed on 8/6.  -discussed with neurology, pt to complete 5 full treatments of high dose steroid which last dose should complete on 8/8.  DC home after last IV steroid treatment today.      Leg pain - resolved  - due to MS exacerbation, will keep on as needed Tylenol, oxycodone and morphine, and will start on as needed Robaxin - CK is normal -pain is resolving and mobility is much improved.    Hyperlipidemia -Resume statin    E coli/Step Gallolyticus UTI -pansensitive, treated with IV ceftriaxone    DVT prophylaxis: enoxaparin Code Status: Full  Family Communication:  Disposition: anticipating  home with home health  Remains inpatient appropriate because: high dose IV steroids   Discharge Diagnoses:  Principal Problem:   Multiple sclerosis exacerbation ([place]) Active Problems:   Hyperlipidemia   Hypokalemia   Pyuria  Discharge Instructions: Discharge Instructions     Ambulatory referral to Neurology   Complete by: As directed    An appointment is requested in approximately: 4 weeks      Allergies as of 03/24/2022       Reactions   Sulfamethoxazole-trimethoprim Hives        Medication List     STOP taking these medications    predniSONE 10 MG tablet Commonly known as:  DELTASONE       TAKE these medications    acetaminophen 500 MG tablet Commonly known as: TYLENOL Take 1,000 mg by mouth every 6 (six) hours as needed for moderate pain.   dalfampridine 10 MG Tb12 Take 1 tablet by mouth 2 (two) times daily.   FLUoxetine 20 MG capsule Commonly known as: PROZAC Take 20 mg by mouth daily.   gabapentin 300 MG capsule Commonly  known as: NEURONTIN Take 300 mg by mouth 2 (two) times daily.   OVER THE COUNTER MEDICATION Take 1 tablet by mouth daily. 8 Greens otc supplement   rosuvastatin 5 MG tablet Commonly known as: CRESTOR Take 5 mg by mouth at bedtime.   Tecfidera 240 MG Cpdr Generic drug: Dimethyl Fumarate Take 240 mg by mouth 2 (two) times daily.               Durable Medical Equipment  (From admission, onward)           Start     Ordered   03/23/22 1332  For home use only DME Tub bench  Once        03/23/22 1332            Follow-up Information     [facility] Follow up.   Why: [facility] will call you within 48 hours of discharge to set up appointments.        Redmond School, MD. Schedule an appointment as soon as possible for a visit in 1 week(s).   Specialty: Internal Medicine Why: Hospital Follow Up Contact information: [address] [place] [address] [phone]         Britt Bottom, MD. Schedule an appointment as soon as possible for a visit in 3 week(s).   Specialty: Neurology Why: Hospital Follow Up Contact information: [place] [address] [phone]                Allergies  Allergen Reactions   Sulfamethoxazole-Trimethoprim Hives   Allergies as of 03/24/2022       Reactions   Sulfamethoxazole-trimethoprim Hives        Medication List     STOP taking these medications    predniSONE 10 MG tablet Commonly known as: DELTASONE       TAKE these medications    acetaminophen 500 MG tablet Commonly known as: TYLENOL Take 1,000 mg by mouth every 6 (six) hours as needed for moderate pain.   dalfampridine 10 MG Tb12 Take 1 tablet by mouth 2 (two) times daily.   FLUoxetine 20 MG capsule Commonly known as: PROZAC Take 20 mg by mouth daily.   gabapentin 300 MG capsule Commonly known as: NEURONTIN Take 300 mg by mouth 2 (two) times daily.   OVER THE COUNTER MEDICATION Take 1 tablet by  mouth daily. 8 Greens otc supplement   rosuvastatin 5 MG tablet Commonly known as: CRESTOR Take 5 mg by mouth at bedtime.   Tecfidera 240 MG Cpdr Generic drug: Dimethyl Fumarate Take 240 mg by mouth 2 (two) times daily.               Durable Medical Equipment  (From admission, onward)           Start     Ordered   03/23/22 1332  For home use only DME Tub bench  Once        03/23/22 1332           Procedures/Studies: MR Brain W and Wo  Contrast  Result Date: 03/20/2022 CLINICAL DATA:  Provided history: Multiple sclerosis. MS flare up. EXAM: MRI HEAD WITHOUT AND WITH CONTRAST MRI CERVICAL SPINE WITHOUT AND WITH CONTRAST MRI THORACIC SPINE WITHOUT AND WITH CONTRAST CONTRAST:  45m GADAVIST GADOBUTROL 1 MMOL/ML IV SOLN TECHNIQUE: Multiplanar, multiecho pulse sequences of the brain and surrounding structures, and cervical and thoracic spine were obtained without and with intravenous contrast. COMPARISON:  Prior brain MRI examinations 02/08/2020 and earlier. Cervical spine MRI 03/14/2014. FINDINGS: MRI HEAD FINDINGS The examination is intermittently motion degraded. Most notably, there is severe motion degradation of the pre-contrast axial T1-weighted sequence and moderate-to-severe motion degradation of the coronal T2 sequence. Brain: Age advanced parenchymal atrophy. Severe patchy and confluent T2 FLAIR hyperintense signal abnormality within the cerebral white matter. Diffuse T2 FLAIR hyperintense signal abnormality and atrophy of the corpus callosum. 13 x 8 mm peripherally enhancing lesion within the posterior right temporal lobe white matter compatible with a site of active demyelination. 3 mm focus of enhancement within the posterior right frontal lobe white matter, likely reflecting an additional site of active demyelination (series 21, image 15) (series 20, image 37). No new white matter lesion is identified elsewhere. There is no acute infarct. No evidence of an intracranial mass.  No chronic intracranial blood products. No extra-axial fluid collection. No midline shift. Vascular: Maintained flow voids within the proximal large arterial vessels. Skull and upper cervical spine: No focal suspicious marrow lesion. Sinuses/Orbits: No mass or acute finding within the imaged orbits. 2 cm mucous retention cyst within the left maxillary sinus. Mild mucosal thickening within the left frontal sinus. MRI CERVICAL SPINE FINDINGS Intermittently motion degraded examination, limiting evaluation. Most notably, there is moderate to severe motion degradation of the sagittal T2 sequence, moderate to severe motion degradation of the sagittal STIR sequence, moderate motion degradation of the axial T2-weighted sequences and moderate motion degradation of the sagittal T1-weighted post-contrast sequence. Alignment: Reversal of the expected cervical lordosis. No significant spondylolisthesis. Vertebrae: Vertebral body height is maintained. Within the limitations of motion degradation, no significant marrow edema or focal suspicious osseous lesion is identified. Degenerative endplate irregularity at C5-C6. Cord: Small T2 hyperintense lesions within the posterior aspect of the spinal cord at the C2-C3 level, unchanged from the prior examination of 03/14/2014. No other definite lesions are identified within the cervical spinal cord, although motion degradation significantly limits evaluation. Within the limitations of motion degradation, no definite pathologic enhancement of the cervical spinal cord is appreciated. Posterior Fossa, vertebral arteries, paraspinal tissues: Posterior fossa better assessed on concurrently performed brain MRI. Flow voids preserved within the imaged cervical vertebral arteries. No paraspinal mass or collection. Disc levels: Unless otherwise stated, the level by level findings below have not significantly changed from the prior MRI of 03/14/2014. Progressive disc degeneration. Most notably,  progressive mild-to-moderate disc degeneration is present at C5-C6. C2-C3: Slight disc bulge, new from the prior MRI. No significant spinal canal or foraminal stenosis. C3-C4: No significant disc herniation or stenosis. C4-C5: No significant disc herniation or stenosis. C5-C6: Disc bulge with endplate spurring and bilateral uncovertebral hypertrophy, progressed. The disc bulge mildly effaces the ventral thecal sac and may contact the ventral aspect of the spinal cord. Progressive bilateral neural foraminal narrowing (mild right, mild to moderate left). C6-C7: Shallow disc bulge, new from the prior MRI. Mild effacement of the ventral thecal sac (without spinal cord mass effect). No significant foraminal stenosis. C7-T1: No significant disc herniation or stenosis. MRI THORACIC SPINE FINDINGS The examination is intermittently motion degraded,  limiting evaluation. Most notably, the sagittal T2 sequence is mild to moderately motion degraded, the axial T2 weighted sequences are moderately motion degraded, the sagittal T1 weighted post-contrast sequence is severely motion degraded and the axial T1 weighted post-contrast sequence is severely motion degraded. Alignment:  No significant spondylolisthesis. Vertebrae: Vertebral body height is maintained. No significant marrow edema or focal suspicious osseous lesion. Cord: Short segment T2 hyperintense lesions (versus artifact) within the left aspect of the spinal cord at the T3 level (series 6, image 9) and within the anterior and right aspect of the spinal cord at the T11 level (series 6, image 33). No definite lesions are identified elsewhere within the spinal cord at the thoracic levels, although motion degradation significantly limits evaluation. Severely motion degraded postcontrast imaging, precluding evaluation for pathologic spinal cord enhancement at the thoracic levels. Paraspinal and other soft tissues: No abnormality is identified within included portions of the  thorax or upper abdomen/retroperitoneum. No paraspinal mass or collection. Disc levels: No more than mild disc degeneration within the thoracic spine. Small multilevel disc bulges. No focal disc herniation, significant spinal canal stenosis or neural foraminal narrowing. IMPRESSION: MRI brain: 1. Significant intermittent motion degradation. 2. Extensive T2 FLAIR hyperintense signal abnormality within the supratentorial white matter, compatible with the provided history of multiple sclerosis. 13 x 8 mm peripherally enhancing lesion within the posterior right temporal lobe white matter, consistent with a site of active demyelination. 3 mm focus of enhancement within the posterior right frontal lobe white matter, likely reflecting an additional site of active demyelination. No other definitively new lesions are identified. 3. Age advanced parenchymal atrophy. 4. Paranasal sinus disease, as described. MRI cervical spine: 1. Significantly motion degraded examination. 2. Small T2 hyperintense lesions within the posterior aspect of the spinal cord at the C2-C3 level, unchanged from the prior MRI of 03/14/2014. No definite lesions are identified elsewhere within the cervical spinal cord, although motion degradation significantly limits evaluation. No appreciable pathologic spinal cord enhancement. 3. Progressive cervical spondylosis, greatest at C5-C6. MRI thoracic spine: 1. Significantly motion degraded examination. 2. Short-segment T2 hyperintense lesions (versus artifact) within the left aspect of the spinal cord at the T3 level, and within the ventral and right aspect of the spinal cord at the T11 level. Elsewhere, no definite lesions are identified within the thoracic spinal cord, although motion degradation significantly limits evaluation. Severe motion degradation of the post-contrast imaging precludes evaluation for pathologic spinal cord enhancement. 3. Thoracic spondylosis, as outlined. No significant spinal canal  or foraminal stenosis. Electronically Signed   By: Kellie Simmering D.O.   On: 03/20/2022 14:22   MR Cervical Spine W or Wo Contrast  Result Date: 03/20/2022 CLINICAL DATA:  Provided history: Multiple sclerosis. MS flare up. EXAM: MRI HEAD WITHOUT AND WITH CONTRAST MRI CERVICAL SPINE WITHOUT AND WITH CONTRAST MRI THORACIC SPINE WITHOUT AND WITH CONTRAST CONTRAST:  22m GADAVIST GADOBUTROL 1 MMOL/ML IV SOLN TECHNIQUE: Multiplanar, multiecho pulse sequences of the brain and surrounding structures, and cervical and thoracic spine were obtained without and with intravenous contrast. COMPARISON:  Prior brain MRI examinations 02/08/2020 and earlier. Cervical spine MRI 03/14/2014. FINDINGS: MRI HEAD FINDINGS The examination is intermittently motion degraded. Most notably, there is severe motion degradation of the pre-contrast axial T1-weighted sequence and moderate-to-severe motion degradation of the coronal T2 sequence. Brain: Age advanced parenchymal atrophy. Severe patchy and confluent T2 FLAIR hyperintense signal abnormality within the cerebral white matter. Diffuse T2 FLAIR hyperintense signal abnormality and atrophy of the corpus callosum. 13 x  8 mm peripherally enhancing lesion within the posterior right temporal lobe white matter compatible with a site of active demyelination. 3 mm focus of enhancement within the posterior right frontal lobe white matter, likely reflecting an additional site of active demyelination (series 21, image 15) (series 20, image 37). No new white matter lesion is identified elsewhere. There is no acute infarct. No evidence of an intracranial mass. No chronic intracranial blood products. No extra-axial fluid collection. No midline shift. Vascular: Maintained flow voids within the proximal large arterial vessels. Skull and upper cervical spine: No focal suspicious marrow lesion. Sinuses/Orbits: No mass or acute finding within the imaged orbits. 2 cm mucous retention cyst within the left  maxillary sinus. Mild mucosal thickening within the left frontal sinus. MRI CERVICAL SPINE FINDINGS Intermittently motion degraded examination, limiting evaluation. Most notably, there is moderate to severe motion degradation of the sagittal T2 sequence, moderate to severe motion degradation of the sagittal STIR sequence, moderate motion degradation of the axial T2-weighted sequences and moderate motion degradation of the sagittal T1-weighted post-contrast sequence. Alignment: Reversal of the expected cervical lordosis. No significant spondylolisthesis. Vertebrae: Vertebral body height is maintained. Within the limitations of motion degradation, no significant marrow edema or focal suspicious osseous lesion is identified. Degenerative endplate irregularity at C5-C6. Cord: Small T2 hyperintense lesions within the posterior aspect of the spinal cord at the C2-C3 level, unchanged from the prior examination of 03/14/2014. No other definite lesions are identified within the cervical spinal cord, although motion degradation significantly limits evaluation. Within the limitations of motion degradation, no definite pathologic enhancement of the cervical spinal cord is appreciated. Posterior Fossa, vertebral arteries, paraspinal tissues: Posterior fossa better assessed on concurrently performed brain MRI. Flow voids preserved within the imaged cervical vertebral arteries. No paraspinal mass or collection. Disc levels: Unless otherwise stated, the level by level findings below have not significantly changed from the prior MRI of 03/14/2014. Progressive disc degeneration. Most notably, progressive mild-to-moderate disc degeneration is present at C5-C6. C2-C3: Slight disc bulge, new from the prior MRI. No significant spinal canal or foraminal stenosis. C3-C4: No significant disc herniation or stenosis. C4-C5: No significant disc herniation or stenosis. C5-C6: Disc bulge with endplate spurring and bilateral uncovertebral  hypertrophy, progressed. The disc bulge mildly effaces the ventral thecal sac and may contact the ventral aspect of the spinal cord. Progressive bilateral neural foraminal narrowing (mild right, mild to moderate left). C6-C7: Shallow disc bulge, new from the prior MRI. Mild effacement of the ventral thecal sac (without spinal cord mass effect). No significant foraminal stenosis. C7-T1: No significant disc herniation or stenosis. MRI THORACIC SPINE FINDINGS The examination is intermittently motion degraded, limiting evaluation. Most notably, the sagittal T2 sequence is mild to moderately motion degraded, the axial T2 weighted sequences are moderately motion degraded, the sagittal T1 weighted post-contrast sequence is severely motion degraded and the axial T1 weighted post-contrast sequence is severely motion degraded. Alignment:  No significant spondylolisthesis. Vertebrae: Vertebral body height is maintained. No significant marrow edema or focal suspicious osseous lesion. Cord: Short segment T2 hyperintense lesions (versus artifact) within the left aspect of the spinal cord at the T3 level (series 6, image 9) and within the anterior and right aspect of the spinal cord at the T11 level (series 6, image 33). No definite lesions are identified elsewhere within the spinal cord at the thoracic levels, although motion degradation significantly limits evaluation. Severely motion degraded postcontrast imaging, precluding evaluation for pathologic spinal cord enhancement at the thoracic levels. Paraspinal and other  soft tissues: No abnormality is identified within included portions of the thorax or upper abdomen/retroperitoneum. No paraspinal mass or collection. Disc levels: No more than mild disc degeneration within the thoracic spine. Small multilevel disc bulges. No focal disc herniation, significant spinal canal stenosis or neural foraminal narrowing. IMPRESSION: MRI brain: 1. Significant intermittent motion degradation.  2. Extensive T2 FLAIR hyperintense signal abnormality within the supratentorial white matter, compatible with the provided history of multiple sclerosis. 13 x 8 mm peripherally enhancing lesion within the posterior right temporal lobe white matter, consistent with a site of active demyelination. 3 mm focus of enhancement within the posterior right frontal lobe white matter, likely reflecting an additional site of active demyelination. No other definitively new lesions are identified. 3. Age advanced parenchymal atrophy. 4. Paranasal sinus disease, as described. MRI cervical spine: 1. Significantly motion degraded examination. 2. Small T2 hyperintense lesions within the posterior aspect of the spinal cord at the C2-C3 level, unchanged from the prior MRI of 03/14/2014. No definite lesions are identified elsewhere within the cervical spinal cord, although motion degradation significantly limits evaluation. No appreciable pathologic spinal cord enhancement. 3. Progressive cervical spondylosis, greatest at C5-C6. MRI thoracic spine: 1. Significantly motion degraded examination. 2. Short-segment T2 hyperintense lesions (versus artifact) within the left aspect of the spinal cord at the T3 level, and within the ventral and right aspect of the spinal cord at the T11 level. Elsewhere, no definite lesions are identified within the thoracic spinal cord, although motion degradation significantly limits evaluation. Severe motion degradation of the post-contrast imaging precludes evaluation for pathologic spinal cord enhancement. 3. Thoracic spondylosis, as outlined. No significant spinal canal or foraminal stenosis. Electronically Signed   By: Kellie Simmering D.O.   On: 03/20/2022 14:22   MR THORACIC SPINE W WO CONTRAST  Result Date: 03/20/2022 CLINICAL DATA:  Provided history: Multiple sclerosis. MS flare up. EXAM: MRI HEAD WITHOUT AND WITH CONTRAST MRI CERVICAL SPINE WITHOUT AND WITH CONTRAST MRI THORACIC SPINE WITHOUT AND WITH  CONTRAST CONTRAST:  8m GADAVIST GADOBUTROL 1 MMOL/ML IV SOLN TECHNIQUE: Multiplanar, multiecho pulse sequences of the brain and surrounding structures, and cervical and thoracic spine were obtained without and with intravenous contrast. COMPARISON:  Prior brain MRI examinations 02/08/2020 and earlier. Cervical spine MRI 03/14/2014. FINDINGS: MRI HEAD FINDINGS The examination is intermittently motion degraded. Most notably, there is severe motion degradation of the pre-contrast axial T1-weighted sequence and moderate-to-severe motion degradation of the coronal T2 sequence. Brain: Age advanced parenchymal atrophy. Severe patchy and confluent T2 FLAIR hyperintense signal abnormality within the cerebral white matter. Diffuse T2 FLAIR hyperintense signal abnormality and atrophy of the corpus callosum. 13 x 8 mm peripherally enhancing lesion within the posterior right temporal lobe white matter compatible with a site of active demyelination. 3 mm focus of enhancement within the posterior right frontal lobe white matter, likely reflecting an additional site of active demyelination (series 21, image 15) (series 20, image 37). No new white matter lesion is identified elsewhere. There is no acute infarct. No evidence of an intracranial mass. No chronic intracranial blood products. No extra-axial fluid collection. No midline shift. Vascular: Maintained flow voids within the proximal large arterial vessels. Skull and upper cervical spine: No focal suspicious marrow lesion. Sinuses/Orbits: No mass or acute finding within the imaged orbits. 2 cm mucous retention cyst within the left maxillary sinus. Mild mucosal thickening within the left frontal sinus. MRI CERVICAL SPINE FINDINGS Intermittently motion degraded examination, limiting evaluation. Most notably, there is moderate to severe motion degradation  of the sagittal T2 sequence, moderate to severe motion degradation of the sagittal STIR sequence, moderate motion degradation  of the axial T2-weighted sequences and moderate motion degradation of the sagittal T1-weighted post-contrast sequence. Alignment: Reversal of the expected cervical lordosis. No significant spondylolisthesis. Vertebrae: Vertebral body height is maintained. Within the limitations of motion degradation, no significant marrow edema or focal suspicious osseous lesion is identified. Degenerative endplate irregularity at C5-C6. Cord: Small T2 hyperintense lesions within the posterior aspect of the spinal cord at the C2-C3 level, unchanged from the prior examination of 03/14/2014. No other definite lesions are identified within the cervical spinal cord, although motion degradation significantly limits evaluation. Within the limitations of motion degradation, no definite pathologic enhancement of the cervical spinal cord is appreciated. Posterior Fossa, vertebral arteries, paraspinal tissues: Posterior fossa better assessed on concurrently performed brain MRI. Flow voids preserved within the imaged cervical vertebral arteries. No paraspinal mass or collection. Disc levels: Unless otherwise stated, the level by level findings below have not significantly changed from the prior MRI of 03/14/2014. Progressive disc degeneration. Most notably, progressive mild-to-moderate disc degeneration is present at C5-C6. C2-C3: Slight disc bulge, new from the prior MRI. No significant spinal canal or foraminal stenosis. C3-C4: No significant disc herniation or stenosis. C4-C5: No significant disc herniation or stenosis. C5-C6: Disc bulge with endplate spurring and bilateral uncovertebral hypertrophy, progressed. The disc bulge mildly effaces the ventral thecal sac and may contact the ventral aspect of the spinal cord. Progressive bilateral neural foraminal narrowing (mild right, mild to moderate left). C6-C7: Shallow disc bulge, new from the prior MRI. Mild effacement of the ventral thecal sac (without spinal cord mass effect). No  significant foraminal stenosis. C7-T1: No significant disc herniation or stenosis. MRI THORACIC SPINE FINDINGS The examination is intermittently motion degraded, limiting evaluation. Most notably, the sagittal T2 sequence is mild to moderately motion degraded, the axial T2 weighted sequences are moderately motion degraded, the sagittal T1 weighted post-contrast sequence is severely motion degraded and the axial T1 weighted post-contrast sequence is severely motion degraded. Alignment:  No significant spondylolisthesis. Vertebrae: Vertebral body height is maintained. No significant marrow edema or focal suspicious osseous lesion. Cord: Short segment T2 hyperintense lesions (versus artifact) within the left aspect of the spinal cord at the T3 level (series 6, image 9) and within the anterior and right aspect of the spinal cord at the T11 level (series 6, image 33). No definite lesions are identified elsewhere within the spinal cord at the thoracic levels, although motion degradation significantly limits evaluation. Severely motion degraded postcontrast imaging, precluding evaluation for pathologic spinal cord enhancement at the thoracic levels. Paraspinal and other soft tissues: No abnormality is identified within included portions of the thorax or upper abdomen/retroperitoneum. No paraspinal mass or collection. Disc levels: No more than mild disc degeneration within the thoracic spine. Small multilevel disc bulges. No focal disc herniation, significant spinal canal stenosis or neural foraminal narrowing. IMPRESSION: MRI brain: 1. Significant intermittent motion degradation. 2. Extensive T2 FLAIR hyperintense signal abnormality within the supratentorial white matter, compatible with the provided history of multiple sclerosis. 13 x 8 mm peripherally enhancing lesion within the posterior right temporal lobe white matter, consistent with a site of active demyelination. 3 mm focus of enhancement within the posterior right  frontal lobe white matter, likely reflecting an additional site of active demyelination. No other definitively new lesions are identified. 3. Age advanced parenchymal atrophy. 4. Paranasal sinus disease, as described. MRI cervical spine: 1. Significantly motion degraded examination. 2.  Small T2 hyperintense lesions within the posterior aspect of the spinal cord at the C2-C3 level, unchanged from the prior MRI of 03/14/2014. No definite lesions are identified elsewhere within the cervical spinal cord, although motion degradation significantly limits evaluation. No appreciable pathologic spinal cord enhancement. 3. Progressive cervical spondylosis, greatest at C5-C6. MRI thoracic spine: 1. Significantly motion degraded examination. 2. Short-segment T2 hyperintense lesions (versus artifact) within the left aspect of the spinal cord at the T3 level, and within the ventral and right aspect of the spinal cord at the T11 level. Elsewhere, no definite lesions are identified within the thoracic spinal cord, although motion degradation significantly limits evaluation. Severe motion degradation of the post-contrast imaging precludes evaluation for pathologic spinal cord enhancement. 3. Thoracic spondylosis, as outlined. No significant spinal canal or foraminal stenosis. Electronically Signed   By: Kellie Simmering D.O.   On: 03/20/2022 14:22   DG Hip Unilat With Pelvis 2-3 Views Right  Result Date: 03/20/2022 CLINICAL DATA:  Fall yesterday with right hip pain. EXAM: DG HIP (WITH OR WITHOUT PELVIS) 2-3V RIGHT COMPARISON:  None Available. FINDINGS: There is no acute fracture or dislocation. Femoroacetabular alignment is normal. The SI joints and symphysis pubis are intact. The soft tissues are unremarkable. IMPRESSION: No acute fracture or dislocation. Electronically Signed   By: Valetta Mole M.D.   On: 03/20/2022 11:59     Subjective: Pt reports feeling so much better now, moving extremities well, weakness much improved and  feels well to go home and agreeable to home health services.   Discharge Exam: Vitals:   03/23/22 2100 03/24/22 0449  BP: (!) 142/70 122/84  Pulse: 72 67  Resp: 18 20  Temp: (!) 97.5 F (36.4 C) 97.8 F (36.6 C)  SpO2: 96% 95%   Vitals:   03/23/22 0510 03/23/22 1226 03/23/22 2100 03/24/22 0449  BP: (!) 154/83 (!) 143/87 (!) 142/70 122/84  Pulse: 71 84 72 67  Resp: '18 18 18 20  '$ Temp: 98.1 F (36.7 C) 98.7 F (37.1 C) (!) 97.5 F (36.4 C) 97.8 F (36.6 C)  TempSrc: Oral Oral Oral Oral  SpO2: 97% 97% 96% 95%  Weight:      Height:       General: Pt is alert, awake, not in acute distress Cardiovascular: RRR, S1/S2 +, no rubs, no gallops Respiratory: CTA bilaterally, no wheezing, no rhonchi Abdominal: Soft, NT, ND, bowel sounds + Extremities: no edema, no cyanosis   The results of significant diagnostics from this hospitalization (including imaging, microbiology, ancillary and laboratory) are listed below for reference.     Microbiology: Recent Results (from the past 240 hour(s))  Urine Culture     Status: Abnormal   Collection Time: 03/20/22  3:24 PM   Specimen: Urine, Clean Catch  Result Value Ref Range Status   Specimen Description   Final    URINE, CLEAN CATCH Performed at [facility], [address] [place], [place] [address]    Special Requests   Final    Immunocompromised Performed at [facility], [address] [place], [place] [address]    Culture (A)  Final    >=100,000 COLONIES/mL ESCHERICHIA COLI >=100,000 COLONIES/mL STREPTOCOCCUS GALLOLYTICUS    Report Status 03/24/2022 FINAL  Final   Organism ID, Bacteria ESCHERICHIA COLI (A)  Final   Organism ID, Bacteria STREPTOCOCCUS GALLOLYTICUS (A)  Final      Susceptibility   Escherichia coli - MIC*    AMPICILLIN <=2 SENSITIVE Sensitive     CEFAZOLIN <=4 SENSITIVE  Sensitive     CEFEPIME <=0.12 SENSITIVE Sensitive     CEFTRIAXONE <=0.25 SENSITIVE Sensitive     CIPROFLOXACIN <=0.25 SENSITIVE Sensitive      GENTAMICIN <=1 SENSITIVE Sensitive     IMIPENEM <=0.25 SENSITIVE Sensitive     NITROFURANTOIN <=16 SENSITIVE Sensitive     TRIMETH/SULFA <=20 SENSITIVE Sensitive     AMPICILLIN/SULBACTAM <=2 SENSITIVE Sensitive     PIP/TAZO <=4 SENSITIVE Sensitive     * >=100,000 COLONIES/mL ESCHERICHIA COLI   Streptococcus gallolyticus - MIC*    PENICILLIN <=0.06 SENSITIVE Sensitive     CEFTRIAXONE <=0.12 SENSITIVE Sensitive     ERYTHROMYCIN >=8 RESISTANT Resistant     LEVOFLOXACIN 2 SENSITIVE Sensitive     VANCOMYCIN 0.25 SENSITIVE Sensitive     * >=100,000 COLONIES/mL STREPTOCOCCUS GALLOLYTICUS     Labs: BNP (last 3 results) No results for input(s): "BNP" in the last 8760 hours. Basic Metabolic Panel: Recent Labs  Lab 03/20/22 1111 03/21/22 0421  NA 141 139  K 3.2* 4.6  CL 106 104  CO2 27 28  GLUCOSE 97 154*  BUN 19 14  CREATININE 0.52 0.53  CALCIUM 9.6 9.6   Liver Function Tests: No results for input(s): "AST", "ALT", "ALKPHOS", "BILITOT", "PROT", "ALBUMIN" in the last 168 hours. No results for input(s): "LIPASE", "AMYLASE" in the last 168 hours. No results for input(s): "AMMONIA" in the last 168 hours. CBC: Recent Labs  Lab 03/20/22 1111 03/21/22 0421  WBC 7.0 9.4  NEUTROABS 5.1  --   HGB 13.7 12.7  HCT 42.3 40.2  MCV 83.1 84.3  PLT 249 303   Cardiac Enzymes: Recent Labs  Lab 03/20/22 1111  CKTOTAL 25*   BNP: Invalid input(s): "POCBNP" CBG: Recent Labs  Lab 03/23/22 1108 03/23/22 1611 03/23/22 2102 03/24/22 0720 03/24/22 1109  GLUCAP 217* 164* 152* 134* 128*   D-Dimer No results for input(s): "DDIMER" in the last 72 hours. Hgb A1c No results for input(s): "HGBA1C" in the last 72 hours. Lipid Profile No results for input(s): "CHOL", "HDL", "LDLCALC", "TRIG", "CHOLHDL", "LDLDIRECT" in the last 72 hours. Thyroid function studies No results for input(s): "TSH", "T4TOTAL", "T3FREE", "THYROIDAB" in the last 72 hours.  Invalid input(s): "FREET3" Anemia work  up No results for input(s): "VITAMINB12", "FOLATE", "FERRITIN", "TIBC", "IRON", "RETICCTPCT" in the last 72 hours. Urinalysis    Component Value Date/Time   COLORURINE YELLOW 03/20/2022 1524   APPEARANCEUR HAZY (A) 03/20/2022 1524   LABSPEC 1.030 03/20/2022 1524   PHURINE 5.0 03/20/2022 1524   GLUCOSEU NEGATIVE 03/20/2022 1524   HGBUR MODERATE (A) 03/20/2022 1524   BILIRUBINUR NEGATIVE 03/20/2022 1524   KETONESUR 80 (A) 03/20/2022 1524   PROTEINUR NEGATIVE 03/20/2022 1524   NITRITE POSITIVE (A) 03/20/2022 1524   LEUKOCYTESUR NEGATIVE 03/20/2022 1524   Sepsis Labs Recent Labs  Lab 03/20/22 1111 03/21/22 0421  WBC 7.0 9.4   Microbiology Recent Results (from the past 240 hour(s))  Urine Culture     Status: Abnormal   Collection Time: 03/20/22  3:24 PM   Specimen: Urine, Clean Catch  Result Value Ref Range Status   Specimen Description   Final    URINE, CLEAN CATCH Performed at [facility], [address] [place], [place] [address]    Special Requests   Final    Immunocompromised Performed at [facility], [address] [place], [place] [address]    Culture (A)  Final    >=100,000 COLONIES/mL ESCHERICHIA COLI >=100,000 COLONIES/mL STREPTOCOCCUS GALLOLYTICUS    Report Status 03/24/2022  FINAL  Final   Organism ID, Bacteria ESCHERICHIA COLI (A)  Final   Organism ID, Bacteria STREPTOCOCCUS GALLOLYTICUS (A)  Final      Susceptibility   Escherichia coli - MIC*    AMPICILLIN <=2 SENSITIVE Sensitive     CEFAZOLIN <=4 SENSITIVE Sensitive     CEFEPIME <=0.12 SENSITIVE Sensitive     CEFTRIAXONE <=0.25 SENSITIVE Sensitive     CIPROFLOXACIN <=0.25 SENSITIVE Sensitive     GENTAMICIN <=1 SENSITIVE Sensitive     IMIPENEM <=0.25 SENSITIVE Sensitive     NITROFURANTOIN <=16 SENSITIVE Sensitive     TRIMETH/SULFA <=20 SENSITIVE Sensitive     AMPICILLIN/SULBACTAM <=2 SENSITIVE Sensitive     PIP/TAZO <=4 SENSITIVE Sensitive     * >=100,000 COLONIES/mL ESCHERICHIA COLI    Streptococcus gallolyticus - MIC*    PENICILLIN <=0.06 SENSITIVE Sensitive     CEFTRIAXONE <=0.12 SENSITIVE Sensitive     ERYTHROMYCIN >=8 RESISTANT Resistant     LEVOFLOXACIN 2 SENSITIVE Sensitive     VANCOMYCIN 0.25 SENSITIVE Sensitive     * >=100,000 COLONIES/mL STREPTOCOCCUS GALLOLYTICUS   Time coordinating discharge: 35 mins   SIGNED:  Irwin Brakeman, MD  [facility] 03/24/2022, 11:55 AM How to contact the [facility] Attending or Consulting provider [place] or covering provider during after hours 7P -7A, for this patient?  Check the care team in [facility] and look for a) attending/consulting [facility] provider listed and b) the [facility] team listed Log into [email or web address] and use [place]'s universal password to access. If you do not have the password, please contact the hospital operator. Locate the [facility] provider you are looking for under [facility] and page to a number that you can be directly reached. If you still have difficulty reaching the provider, please page the [facility] (Director on Call) for the Hospitalists listed on amion for assistance.

## 2022-03-24 NOTE — Care Management Important Message (Signed)
Important Message  Patient Details  Name: Erin Berg MRN: [id number] Date of Birth: 1965-12-04   Medicare Important Message Given:  N/A - LOS <3 / Initial given by admissions     Tommy Medal 03/24/2022, 11:36 AM

## 2022-03-24 NOTE — TOC Transition Note (Signed)
Transition of Care [facility]) - CM/SW Discharge Note   Patient Details  Name: Erin Berg MRN: [id number] Date of Birth: 27-Apr-1966  Transition of Care [facility]) CM/SW Contact:  Iona Beard, [place] Phone Number: 03/24/2022, 10:24 AM   Clinical Narrative:    CSW updated that pt will discharge home with [facility] today. CSW updated Judson Roch with [facility] of plan for D/C. HH orders have been placed. CSW to add to AVS. TOC signing off.   Final next level of care: [place] Barriers to Discharge: Barriers Resolved   Patient Goals and CMS Choice Patient states their goals for this hospitalization and ongoing recovery are:: go home CMS Medicare.gov Compare Post Acute Care list provided to:: Patient Choice offered to / list presented to : Patient  Discharge Placement                       Discharge Plan and Services In-house Referral: Clinical Social Work   Post Acute Care Choice: Home Health          DME Arranged: Tub bench DME Agency: [employer] Date DME Agency Contacted: 03/23/22   Representative spoke with at DME Agency: Thedore Mins HH Arranged: PT, OT [place] Agency: [place] Date [place]: 03/23/22   Representative spoke with at [place]: [place] ([place]) Interventions     Readmission Risk Interventions    03/23/2022    2:06 PM  Readmission Risk Prevention Plan  Medication Screening Complete  Transportation Screening Complete

## 2022-03-24 NOTE — Discharge Instructions (Signed)
IMPORTANT INFORMATION: PAY CLOSE ATTENTION  ? ?PHYSICIAN DISCHARGE INSTRUCTIONS ? ?Follow with Primary care provider  Fusco, Lawrence, MD  and other consultants as instructed by your Hospitalist Physician ? ?SEEK MEDICAL CARE OR RETURN TO EMERGENCY ROOM IF SYMPTOMS COME BACK, WORSEN OR NEW PROBLEM DEVELOPS  ? ?Please note: ?You were cared for by a hospitalist during your hospital stay. Every effort will be made to forward records to your primary care provider.  You can request that your primary care provider send for your hospital records if they have not received them.  Once you are discharged, your primary care physician will handle any further medical issues. Please note that NO REFILLS for any discharge medications will be authorized once you are discharged, as it is imperative that you return to your primary care physician (or establish a relationship with a primary care physician if you do not have one) for your post hospital discharge needs so that they can reassess your need for medications and monitor your lab values. ? ?Please get a complete blood count and chemistry panel checked by your Primary MD at your next visit, and again as instructed by your Primary MD. ? ?Get Medicines reviewed and adjusted: ?Please take all your medications with you for your next visit with your Primary MD ? ?Laboratory/radiological data: ?Please request your Primary MD to go over all hospital tests and procedure/radiological results at the follow up, please ask your primary care provider to get all Hospital records sent to his/her office. ? ?In some cases, they will be blood work, cultures and biopsy results pending at the time of your discharge. Please request that your primary care provider follow up on these results. ? ?If you are diabetic, please bring your blood sugar readings with you to your follow up appointment with primary care.   ? ?Please call and make your follow up appointments as soon as possible.   ? ?Also Note  the following: ?If you experience worsening of your admission symptoms, develop shortness of breath, life threatening emergency, suicidal or homicidal thoughts you must seek medical attention immediately by calling 911 or calling your MD immediately  if symptoms less severe. ? ?You must read complete instructions/literature along with all the possible adverse reactions/side effects for all the Medicines you take and that have been prescribed to you. Take any new Medicines after you have completely understood and accpet all the possible adverse reactions/side effects.  ? ?Do not drive when taking Pain medications or sleeping medications (Benzodiazepines) ? ?Do not take more than prescribed Pain, Sleep and Anxiety Medications. It is not advisable to combine anxiety,sleep and pain medications without talking with your primary care practitioner ? ?Special Instructions: If you have smoked or chewed Tobacco  in the last 2 yrs please stop smoking, stop any regular Alcohol  and or any Recreational drug use. ? ?Wear Seat belts while driving.  Do not drive if taking any narcotic, mind altering or controlled substances or recreational drugs or alcohol.  ? ? ? ? ? ?

## 2022-03-25 DIAGNOSIS — B9620 Unspecified Escherichia coli [E. coli] as the cause of diseases classified elsewhere: Secondary | ICD-10-CM | POA: Diagnosis not present

## 2022-03-25 DIAGNOSIS — Z9181 History of falling: Secondary | ICD-10-CM | POA: Diagnosis not present

## 2022-03-25 DIAGNOSIS — R69 Illness, unspecified: Secondary | ICD-10-CM | POA: Diagnosis not present

## 2022-03-25 DIAGNOSIS — N390 Urinary tract infection, site not specified: Secondary | ICD-10-CM | POA: Diagnosis not present

## 2022-03-25 DIAGNOSIS — G35 Multiple sclerosis: Secondary | ICD-10-CM | POA: Diagnosis not present

## 2022-03-25 DIAGNOSIS — E876 Hypokalemia: Secondary | ICD-10-CM | POA: Diagnosis not present

## 2022-03-25 DIAGNOSIS — Z87891 Personal history of nicotine dependence: Secondary | ICD-10-CM | POA: Diagnosis not present

## 2022-03-25 DIAGNOSIS — B954 Other streptococcus as the cause of diseases classified elsewhere: Secondary | ICD-10-CM | POA: Diagnosis not present

## 2022-03-25 DIAGNOSIS — E785 Hyperlipidemia, unspecified: Secondary | ICD-10-CM | POA: Diagnosis not present

## 2022-03-26 DIAGNOSIS — F419 Anxiety disorder, unspecified: Secondary | ICD-10-CM | POA: Diagnosis not present

## 2022-03-26 DIAGNOSIS — R69 Illness, unspecified: Secondary | ICD-10-CM | POA: Diagnosis not present

## 2022-03-26 DIAGNOSIS — Z6835 Body mass index (BMI) 35.0-35.9, adult: Secondary | ICD-10-CM | POA: Diagnosis not present

## 2022-03-26 DIAGNOSIS — G35 Multiple sclerosis: Secondary | ICD-10-CM | POA: Diagnosis not present

## 2022-03-26 DIAGNOSIS — F330 Major depressive disorder, recurrent, mild: Secondary | ICD-10-CM | POA: Diagnosis not present

## 2022-03-29 DIAGNOSIS — B954 Other streptococcus as the cause of diseases classified elsewhere: Secondary | ICD-10-CM | POA: Diagnosis not present

## 2022-03-29 DIAGNOSIS — Z9181 History of falling: Secondary | ICD-10-CM | POA: Diagnosis not present

## 2022-03-29 DIAGNOSIS — R69 Illness, unspecified: Secondary | ICD-10-CM | POA: Diagnosis not present

## 2022-03-29 DIAGNOSIS — G35 Multiple sclerosis: Secondary | ICD-10-CM | POA: Diagnosis not present

## 2022-03-29 DIAGNOSIS — N390 Urinary tract infection, site not specified: Secondary | ICD-10-CM | POA: Diagnosis not present

## 2022-03-29 DIAGNOSIS — E876 Hypokalemia: Secondary | ICD-10-CM | POA: Diagnosis not present

## 2022-03-29 DIAGNOSIS — E785 Hyperlipidemia, unspecified: Secondary | ICD-10-CM | POA: Diagnosis not present

## 2022-03-29 DIAGNOSIS — B9620 Unspecified Escherichia coli [E. coli] as the cause of diseases classified elsewhere: Secondary | ICD-10-CM | POA: Diagnosis not present

## 2022-03-29 DIAGNOSIS — Z87891 Personal history of nicotine dependence: Secondary | ICD-10-CM | POA: Diagnosis not present

## 2022-04-02 DIAGNOSIS — B9620 Unspecified Escherichia coli [E. coli] as the cause of diseases classified elsewhere: Secondary | ICD-10-CM | POA: Diagnosis not present

## 2022-04-02 DIAGNOSIS — Z9181 History of falling: Secondary | ICD-10-CM | POA: Diagnosis not present

## 2022-04-02 DIAGNOSIS — R69 Illness, unspecified: Secondary | ICD-10-CM | POA: Diagnosis not present

## 2022-04-02 DIAGNOSIS — G35 Multiple sclerosis: Secondary | ICD-10-CM | POA: Diagnosis not present

## 2022-04-02 DIAGNOSIS — E785 Hyperlipidemia, unspecified: Secondary | ICD-10-CM | POA: Diagnosis not present

## 2022-04-02 DIAGNOSIS — B954 Other streptococcus as the cause of diseases classified elsewhere: Secondary | ICD-10-CM | POA: Diagnosis not present

## 2022-04-02 DIAGNOSIS — E876 Hypokalemia: Secondary | ICD-10-CM | POA: Diagnosis not present

## 2022-04-02 DIAGNOSIS — Z87891 Personal history of nicotine dependence: Secondary | ICD-10-CM | POA: Diagnosis not present

## 2022-04-02 DIAGNOSIS — N390 Urinary tract infection, site not specified: Secondary | ICD-10-CM | POA: Diagnosis not present

## 2022-04-07 DIAGNOSIS — N390 Urinary tract infection, site not specified: Secondary | ICD-10-CM | POA: Diagnosis not present

## 2022-04-07 DIAGNOSIS — Z87891 Personal history of nicotine dependence: Secondary | ICD-10-CM | POA: Diagnosis not present

## 2022-04-07 DIAGNOSIS — R69 Illness, unspecified: Secondary | ICD-10-CM | POA: Diagnosis not present

## 2022-04-07 DIAGNOSIS — E876 Hypokalemia: Secondary | ICD-10-CM | POA: Diagnosis not present

## 2022-04-07 DIAGNOSIS — E785 Hyperlipidemia, unspecified: Secondary | ICD-10-CM | POA: Diagnosis not present

## 2022-04-07 DIAGNOSIS — B9620 Unspecified Escherichia coli [E. coli] as the cause of diseases classified elsewhere: Secondary | ICD-10-CM | POA: Diagnosis not present

## 2022-04-07 DIAGNOSIS — G35 Multiple sclerosis: Secondary | ICD-10-CM | POA: Diagnosis not present

## 2022-04-07 DIAGNOSIS — Z9181 History of falling: Secondary | ICD-10-CM | POA: Diagnosis not present

## 2022-04-07 DIAGNOSIS — B954 Other streptococcus as the cause of diseases classified elsewhere: Secondary | ICD-10-CM | POA: Diagnosis not present

## 2022-04-08 DIAGNOSIS — R8281 Pyuria: Secondary | ICD-10-CM | POA: Diagnosis not present

## 2022-04-08 DIAGNOSIS — E876 Hypokalemia: Secondary | ICD-10-CM | POA: Diagnosis not present

## 2022-04-08 DIAGNOSIS — G35 Multiple sclerosis: Secondary | ICD-10-CM | POA: Diagnosis not present

## 2022-04-08 DIAGNOSIS — E785 Hyperlipidemia, unspecified: Secondary | ICD-10-CM | POA: Diagnosis not present

## 2022-04-14 DIAGNOSIS — B954 Other streptococcus as the cause of diseases classified elsewhere: Secondary | ICD-10-CM | POA: Diagnosis not present

## 2022-04-14 DIAGNOSIS — R69 Illness, unspecified: Secondary | ICD-10-CM | POA: Diagnosis not present

## 2022-04-14 DIAGNOSIS — Z87891 Personal history of nicotine dependence: Secondary | ICD-10-CM | POA: Diagnosis not present

## 2022-04-14 DIAGNOSIS — E785 Hyperlipidemia, unspecified: Secondary | ICD-10-CM | POA: Diagnosis not present

## 2022-04-14 DIAGNOSIS — G35 Multiple sclerosis: Secondary | ICD-10-CM | POA: Diagnosis not present

## 2022-04-14 DIAGNOSIS — E876 Hypokalemia: Secondary | ICD-10-CM | POA: Diagnosis not present

## 2022-04-14 DIAGNOSIS — B9620 Unspecified Escherichia coli [E. coli] as the cause of diseases classified elsewhere: Secondary | ICD-10-CM | POA: Diagnosis not present

## 2022-04-14 DIAGNOSIS — Z9181 History of falling: Secondary | ICD-10-CM | POA: Diagnosis not present

## 2022-04-14 DIAGNOSIS — N390 Urinary tract infection, site not specified: Secondary | ICD-10-CM | POA: Diagnosis not present

## 2022-04-15 DIAGNOSIS — H43393 Other vitreous opacities, bilateral: Secondary | ICD-10-CM | POA: Diagnosis not present

## 2022-04-16 ENCOUNTER — Ambulatory Visit (INDEPENDENT_AMBULATORY_CARE_PROVIDER_SITE_OTHER): Payer: Medicare HMO | Admitting: Neurology

## 2022-04-16 ENCOUNTER — Encounter: Payer: Self-pay | Admitting: Neurology

## 2022-04-16 ENCOUNTER — Other Ambulatory Visit: Payer: Self-pay | Admitting: Neurology

## 2022-04-16 VITALS — BP 131/75 | HR 92 | Wt 216.0 lb

## 2022-04-16 DIAGNOSIS — G35 Multiple sclerosis: Secondary | ICD-10-CM | POA: Diagnosis not present

## 2022-04-16 DIAGNOSIS — G801 Spastic diplegic cerebral palsy: Secondary | ICD-10-CM | POA: Diagnosis not present

## 2022-04-16 DIAGNOSIS — E559 Vitamin D deficiency, unspecified: Secondary | ICD-10-CM | POA: Diagnosis not present

## 2022-04-16 DIAGNOSIS — Z79899 Other long term (current) drug therapy: Secondary | ICD-10-CM | POA: Diagnosis not present

## 2022-04-16 DIAGNOSIS — R269 Unspecified abnormalities of gait and mobility: Secondary | ICD-10-CM

## 2022-04-16 MED ORDER — ERGOCALCIFEROL 1.25 MG (50000 UT) PO CAPS: 50000.0000 [IU] | ORAL_CAPSULE | ORAL | 3 refills | Status: DC

## 2022-04-16 NOTE — Progress Notes (Signed)
[facility]  PATIENT: Erin Berg DOB: Sep 01, 1965  REFERRING DOCTOR OR PCP: Dr. Irwin Brakeman; Dr. Redmond School SOURCE: Patient, notes from primary care, notes from Dr. Jannifer Franklin  _________________________________   HISTORICAL  CHIEF COMPLAINT:  Chief Complaint  Patient presents with   New Patient (Initial Visit)    Pt with aide, and dad. Pt 18. MS attack and was In hospital for 5 days and received steroid iv treatment. Diagnosed with MS 89 and was followed by Dr Jannifer Franklin. She has been followed by Dr Merlene Laughter but is here to establish care for treatment    HISTORY OF PRESENT ILLNESS:  I had the pleasure of seeing your patient, Erin Berg, at the [place] at [facility] for neurologic consultation regarding her multiple sclerosis and recent breakthrough activity.  MS history: She is a 56 year woman who ws diagnosed with MS in 1998.  First symptoms were in 1996 when she had trouble writing for a week or two.  In 1998, she had numbness and clumsiness on her left and she went to the ED.   She was diagnosed with MS.   She saw Dr. Jannifer Franklin and was placed on Betaseron.   At some point she saw a doctor at [facility] (Dr. Jacqulynn Cadet) and srarted seeing Dr. Merlene Laughter when Dr. Jacqulynn Cadet moved away.    She switched to Tecfidera (unsure when but at least since before 2020).   She had generally done ok without relapse but her gait slowly worsened.  In early August, 2023,  she had numbness that worsened below her waist and spasms in her left leg associated with a lot of pain.   Of note, she was on Tecfidera at the time  Currently, she has poor gait and can only do a couple steps with a walker.  She takes dalfampridine with mild benefit in leg strength.  She is not at her baseline since before the last exacerbation.    She is able to transfer well.  She has leg spasticity, left > right.  She takes baclofen 10 mg 2-3 times a day.  She feels trunk is weak but arms  are strong.    She felt numbness improved after she received steroids a few weeks ago.   She has some dysesthesias and takes gabapentin.  She is still doing PT/OT.     She has urinary incontinence x years.  She wears Depends.  Vision is fine and she feels acuity and color saturation are symmetric.  She notes some fatigue.  She denies depression.  Sleep is variable.  She notes no recent changes in cognition.   Imaging personally reviewed: MRI of the brain with and without contrast 03/20/2022 showed multiple T2/FLAIR hyperintense foci with confluencies in the periatrial and deep white matter.  There is an enhancing lesion in the right temporal lobe and smaller focus in the right frontal lobe.  More than expected atrophy for age.  This study was compared to the MRI from 02/08/2020 and there are just the enhancing foci that appear to be new in that interim  MRI cervical and thoracic spine with and without contrast show T2 hyperintense foci posteriorly adjacent to C2-C3, possibly to the left adjacent to C3, anterolaterally to the left adjacent to T3, centrally adjacent to T11.  Movement artifact is noted on the spine images.  None of the foci appear to be acute.   There are also degenerative changes with protrusion at C5-C6.  No spinal stenosis.  REVIEW  OF SYSTEMS: Constitutional: No fevers, chills, sweats, or change in appetite Eyes: No visual changes, double vision, eye pain Ear, nose and throat: No hearing loss, ear pain, nasal congestion, sore throat Cardiovascular: No chest pain, palpitations Respiratory:  No shortness of breath at rest or with exertion.   No wheezes GastrointestinaI: No nausea, vomiting, diarrhea, abdominal pain, fecal incontinence Genitourinary: She has incontinence. Musculoskeletal:  No neck pain, back pain Integumentary: No rash, pruritus, skin lesions Neurological: as above Psychiatric: No depression at this time.  No anxiety Endocrine: No palpitations, diaphoresis, change  in appetite, change in weigh or increased thirst Hematologic/Lymphatic:  No anemia, purpura, petechiae. Allergic/Immunologic: No itchy/runny eyes, nasal congestion, recent allergic reactions, rashes  ALLERGIES: Allergies  Allergen Reactions   Sulfamethoxazole-Trimethoprim Hives    HOME MEDICATIONS:  Current Outpatient Medications:    acetaminophen (TYLENOL) 500 MG tablet, Take 1,000 mg by mouth every 6 (six) hours as needed for moderate pain., Disp: , Rfl:    baclofen (LIORESAL) 10 MG tablet, Take 10 mg by mouth 3 (three) times daily., Disp: , Rfl:    dalfampridine 10 MG TB12, Take 1 tablet by mouth 2 (two) times daily., Disp: , Rfl:    FLUoxetine (PROZAC) 20 MG capsule, Take 20 mg by mouth daily., Disp: , Rfl:    gabapentin (NEURONTIN) 300 MG capsule, Take 300 mg by mouth 2 (two) times daily., Disp: , Rfl:    rosuvastatin (CRESTOR) 5 MG tablet, Take 5 mg by mouth at bedtime., Disp: , Rfl:    ergocalciferol (VITAMIN D2) 1.25 MG (50000 UT) capsule, Take 1 capsule (50,000 Units total) by mouth once a week., Disp: 13 capsule, Rfl: 3  PAST MEDICAL HISTORY: Past Medical History:  Diagnosis Date   Anxiety and depression    BMI 24.0-24.9, adult    MS (multiple sclerosis) (HCC)    Multiple sclerosis ([place])     PAST SURGICAL HISTORY: Past Surgical History:  Procedure Laterality Date   COLONOSCOPY WITH PROPOFOL N/A 12/22/2021   Procedure: COLONOSCOPY WITH PROPOFOL;  Surgeon: Daneil Dolin, MD;  Location: [facility];  Service: Endoscopy;  Laterality: N/A;  12:30 / ASA 2   TONSILLECTOMY AND ADENOIDECTOMY      FAMILY HISTORY: Family History  Problem Relation Age of Onset   Breast cancer Mother    Colon cancer Maternal Grandmother    Breast cancer Paternal Grandmother     SOCIAL HISTORY:  Social History   Socioeconomic History   Marital status: Divorced    Spouse name: Not on file   Number of children: Not on file   Years of education: Not on file   Highest education level:  Not on file  Occupational History   Not on file  Tobacco Use   Smoking status: Former    Types: Cigarettes   Smokeless tobacco: Never  Substance and Sexual Activity   Alcohol use: No    Alcohol/week: 0.0 standard drinks of alcohol   Drug use: No   Sexual activity: Not on file  Other Topics Concern   Not on file  Social History Narrative   Not on file   Social Determinants of Health   Financial Resource Strain: Not on file  Food Insecurity: Not on file  Transportation Needs: Not on file  Physical Activity: Not on file  Stress: Not on file  Social Connections: Not on file  Intimate Partner Violence: Not on file     PHYSICAL EXAM  Vitals:   04/16/22 1316  BP: 131/75  Pulse:  92  Weight: 216 lb (98 kg)    Body mass index is 35.94 kg/m.   General: The patient is well-developed and well-nourished and in no acute distress  HEENT:  Head is [place]/AT.  Sclera are anicteric.  Funduscopic exam shows normal optic discs and retinal vessels.  Neck: No carotid bruits are noted.  The neck is nontender.  Cardiovascular: The heart has a regular rate and rhythm with a normal S1 and S2. There were no murmurs, gallops or rubs.    Skin: Extremities are without rash or  edema.  Musculoskeletal:  Back is nontender  Neurologic Exam  Mental status: The patient is alert and oriented x 3 at the time of the examination. The patient has apparent normal recent and remote memory, with an apparently normal attention span and concentration ability.   Speech is normal.  Cranial nerves: Extraocular movements are full. Pupils are equal, round, and reactive to light and accomodation.  Visual fields are full.  Facial symmetry is present. There is good facial sensation to soft touch bilaterally.Facial strength is normal.  Trapezius and sternocleidomastoid strength is normal. No dysarthria is noted.  The tongue is midline, and the patient has symmetric elevation of the soft palate. No obvious hearing  deficits are noted.  Motor:  Muscle bulk is normal.   Tone is increased in the legs, left greater than right. Strength is  5 / 5 in the right arm and 4+/5 predominantly in the left arm with 5 - grip.   Strength is 4+/5 in the right leg and 4 -/5 in the left iliopsoas, 4/5 quadriceps and 3/5 EHL/ankle extension.    Sensory: Sensory testing is intact to pinprick, soft touch and vibration sensation in all 4 extremities.  Coordination: Cerebellar testing reveals good finger-nose-finger on the right and reduced on the left.  She has difficulty with heel-to-shin.  Gait and station: She needs strong bilateral support to stand.  Romberg is positive.  She has difficulty taking steps, especially lifting the left leg.Marland Kitchen   Reflexes: Deep tendon reflexes are symmetric and normal bilaterally.   Plantar responses are flexor.    DIAGNOSTIC DATA (LABS, IMAGING, TESTING) - I reviewed patient records, labs, notes, testing and imaging myself where available.  Lab Results  Component Value Date   WBC 9.4 03/21/2022   HGB 12.7 03/21/2022   HCT 40.2 03/21/2022   MCV 84.3 03/21/2022   PLT 303 03/21/2022      Component Value Date/Time   NA 139 03/21/2022 0421   K 4.6 03/21/2022 0421   CL 104 03/21/2022 0421   CO2 28 03/21/2022 0421   GLUCOSE 154 (H) 03/21/2022 0421   BUN 14 03/21/2022 0421   CREATININE 0.53 03/21/2022 0421   CALCIUM 9.6 03/21/2022 0421   GFRNONAA >60 03/21/2022 0421       ASSESSMENT AND PLAN   Multiple sclerosis exacerbation (HCC) - Plan: Hepatitis B surface antigen, IgG, IgA, IgM, QuantiFERON-TB Gold Plus, Varicella zoster antibody, IgG, Hepatitis B surface antibody,qualitative, Hepatitis C antibody, Hepatitis B core antibody, total, CBC with Differential/Platelet, Comprehensive metabolic panel  High risk medication use - Plan: Hepatitis B surface antigen, IgG, IgA, IgM, QuantiFERON-TB Gold Plus, Varicella zoster antibody, IgG, Hepatitis B surface antibody,qualitative, Hepatitis  C antibody, Hepatitis B core antibody, total, CBC with Differential/Platelet, Comprehensive metabolic panel  Vitamin D deficiency - Plan: VITAMIN D 25 Hydroxy (Vit-D Deficiency, Fractures)  Gait disturbance  Spastic diplegia ([place])    In summary, Ms. Kauth is a 56 year old woman with a  long history of multiple sclerosis who had been stable on Tecfidera for several years but had a major exacerbation 1 month ago.  She had been compliant with medication.  Therefore, we need to consider a stronger disease modifying therapy.  I went over some options and she is most interested in either Ocrevus or Briumvi.  Both the anti-CD20 agents that are infused every 6 months.  We will check lab work for chronic infections.  To help with her symptoms of spasticity and dysesthesias and gait disturbance she will continue baclofen, gabapentin and dalfampridine respectively.  She is advised to continue to try to be as active as she can and to take care with transfers to prevent falls . She will return to see me in 5 to 6 months or sooner for new or worsening symptoms.  Thank you for asking me to see Ms. Diven.  Please let me know if I can be of further assistance with her or other patients in the future.     Vaishnav Demartin A. Felecia Shelling, MD, [facility] 7/98/9211, 9:41 PM Certified in Neurology, Clinical Neurophysiology, Sleep Medicine and Neuroimaging  [facility] [address], [place], [place] [address] [phone]

## 2022-04-17 DIAGNOSIS — G35 Multiple sclerosis: Secondary | ICD-10-CM | POA: Diagnosis not present

## 2022-04-17 DIAGNOSIS — Z87891 Personal history of nicotine dependence: Secondary | ICD-10-CM | POA: Diagnosis not present

## 2022-04-17 DIAGNOSIS — E785 Hyperlipidemia, unspecified: Secondary | ICD-10-CM | POA: Diagnosis not present

## 2022-04-17 DIAGNOSIS — B954 Other streptococcus as the cause of diseases classified elsewhere: Secondary | ICD-10-CM | POA: Diagnosis not present

## 2022-04-17 DIAGNOSIS — Z9181 History of falling: Secondary | ICD-10-CM | POA: Diagnosis not present

## 2022-04-17 DIAGNOSIS — R69 Illness, unspecified: Secondary | ICD-10-CM | POA: Diagnosis not present

## 2022-04-17 DIAGNOSIS — E876 Hypokalemia: Secondary | ICD-10-CM | POA: Diagnosis not present

## 2022-04-17 DIAGNOSIS — B9620 Unspecified Escherichia coli [E. coli] as the cause of diseases classified elsewhere: Secondary | ICD-10-CM | POA: Diagnosis not present

## 2022-04-17 DIAGNOSIS — N390 Urinary tract infection, site not specified: Secondary | ICD-10-CM | POA: Diagnosis not present

## 2022-04-21 LAB — HEPATITIS C ANTIBODY: Hep C Virus Ab: NONREACTIVE

## 2022-04-21 LAB — COMPREHENSIVE METABOLIC PANEL
ALT: 31 IU/L (ref 0–32)
AST: 21 IU/L (ref 0–40)
Albumin/Globulin Ratio: 2.1 (ref 1.2–2.2)
Albumin: 4.8 g/dL (ref 3.8–4.9)
Alkaline Phosphatase: 81 IU/L (ref 44–121)
BUN/Creatinine Ratio: 36 — ABNORMAL HIGH (ref 9–23)
BUN: 20 mg/dL (ref 6–24)
Bilirubin Total: 0.4 mg/dL (ref 0.0–1.2)
CO2: 25 mmol/L (ref 20–29)
Calcium: 9.7 mg/dL (ref 8.7–10.2)
Chloride: 100 mmol/L (ref 96–106)
Creatinine, Ser: 0.56 mg/dL — ABNORMAL LOW (ref 0.57–1.00)
Globulin, Total: 2.3 g/dL (ref 1.5–4.5)
Glucose: 111 mg/dL — ABNORMAL HIGH (ref 70–99)
Potassium: 4.7 mmol/L (ref 3.5–5.2)
Sodium: 142 mmol/L (ref 134–144)
Total Protein: 7.1 g/dL (ref 6.0–8.5)
eGFR: 109 mL/min/{1.73_m2} (ref >59)

## 2022-04-21 LAB — CBC WITH DIFFERENTIAL/PLATELET
Basophils Absolute: 0 10*3/uL (ref 0.0–0.2)
Basos: 1 %
EOS (ABSOLUTE): 0.1 10*3/uL (ref 0.0–0.4)
Eos: 1 %
Hematocrit: 43.8 % (ref 34.0–46.6)
Hemoglobin: 14.4 g/dL (ref 11.1–15.9)
Immature Grans (Abs): 0 10*3/uL (ref 0.0–0.1)
Immature Granulocytes: 0 %
Lymphocytes Absolute: 1.2 10*3/uL (ref 0.7–3.1)
Lymphs: 21 %
MCH: 27.7 pg (ref 26.6–33.0)
MCHC: 32.9 g/dL (ref 31.5–35.7)
MCV: 84 fL (ref 79–97)
Monocytes Absolute: 0.5 10*3/uL (ref 0.1–0.9)
Monocytes: 9 %
Neutrophils Absolute: 4.1 10*3/uL (ref 1.4–7.0)
Neutrophils: 68 %
Platelets: 305 10*3/uL (ref 150–450)
RBC: 5.19 x10E6/uL (ref 3.77–5.28)
RDW: 14.9 % (ref 11.7–15.4)
WBC: 5.9 10*3/uL (ref 3.4–10.8)

## 2022-04-21 LAB — QUANTIFERON-TB GOLD PLUS
QuantiFERON Mitogen Value: 4.11 IU/mL
QuantiFERON Nil Value: 0.02 IU/mL
QuantiFERON TB1 Ag Value: 0.01 IU/mL
QuantiFERON TB2 Ag Value: 0.01 IU/mL
QuantiFERON-TB Gold Plus: NEGATIVE

## 2022-04-21 LAB — IGG, IGA, IGM
IgA/Immunoglobulin A, Serum: 211 mg/dL (ref 87–352)
IgG (Immunoglobin G), Serum: 736 mg/dL (ref 586–1602)
IgM (Immunoglobulin M), Srm: 115 mg/dL (ref 26–217)

## 2022-04-21 LAB — HEPATITIS B SURFACE ANTIBODY,QUALITATIVE: Hep B Surface Ab, Qual: NONREACTIVE

## 2022-04-21 LAB — VARICELLA ZOSTER ANTIBODY, IGG: Varicella zoster IgG: 2104 index (ref >165)

## 2022-04-21 LAB — VITAMIN D 25 HYDROXY (VIT D DEFICIENCY, FRACTURES): Vit D, 25-Hydroxy: 31.8 ng/mL (ref 30.0–100.0)

## 2022-04-21 LAB — HEPATITIS B CORE ANTIBODY, TOTAL: Hep B Core Total Ab: NEGATIVE

## 2022-04-21 LAB — HEPATITIS B SURFACE ANTIGEN: Hepatitis B Surface Ag: NEGATIVE

## 2022-04-22 DIAGNOSIS — B9620 Unspecified Escherichia coli [E. coli] as the cause of diseases classified elsewhere: Secondary | ICD-10-CM | POA: Diagnosis not present

## 2022-04-22 DIAGNOSIS — Z9181 History of falling: Secondary | ICD-10-CM | POA: Diagnosis not present

## 2022-04-22 DIAGNOSIS — N390 Urinary tract infection, site not specified: Secondary | ICD-10-CM | POA: Diagnosis not present

## 2022-04-22 DIAGNOSIS — R69 Illness, unspecified: Secondary | ICD-10-CM | POA: Diagnosis not present

## 2022-04-22 DIAGNOSIS — G35 Multiple sclerosis: Secondary | ICD-10-CM | POA: Diagnosis not present

## 2022-04-22 DIAGNOSIS — Z87891 Personal history of nicotine dependence: Secondary | ICD-10-CM | POA: Diagnosis not present

## 2022-04-22 DIAGNOSIS — E876 Hypokalemia: Secondary | ICD-10-CM | POA: Diagnosis not present

## 2022-04-22 DIAGNOSIS — E785 Hyperlipidemia, unspecified: Secondary | ICD-10-CM | POA: Diagnosis not present

## 2022-04-22 DIAGNOSIS — B954 Other streptococcus as the cause of diseases classified elsewhere: Secondary | ICD-10-CM | POA: Diagnosis not present

## 2022-04-23 ENCOUNTER — Telehealth: Payer: Self-pay | Admitting: *Deleted

## 2022-04-23 NOTE — Telephone Encounter (Signed)
-----   Message from Britt Bottom, MD sent at 04/23/2022  8:29 AM EDT ----- She wants to go on either Briumvi or Ocrevus ([facility] had told us that Briumvi may work out better but if it is difficult to get Ocrevus is fine.).  The labs are fine to go ahead and do that.  Also her vitamin D was low normal.  If she is not already taking some extra vitamin D, she should take 2000 units daily (OTC)

## 2022-04-23 NOTE — Telephone Encounter (Signed)
Called and spoke w/ pt about lab results per Dr. Garth Bigness note. She would prefer to try and get Briumvi approved first. Ok to go to [place] if not approved. Aware orders will be given to [facility]. Once approved, they will contact her to schedule. This process can take 2-3 weeks. She verbalized understanding.

## 2022-04-24 DIAGNOSIS — R69 Illness, unspecified: Secondary | ICD-10-CM | POA: Diagnosis not present

## 2022-04-24 DIAGNOSIS — E876 Hypokalemia: Secondary | ICD-10-CM | POA: Diagnosis not present

## 2022-04-24 DIAGNOSIS — B954 Other streptococcus as the cause of diseases classified elsewhere: Secondary | ICD-10-CM | POA: Diagnosis not present

## 2022-04-24 DIAGNOSIS — G35 Multiple sclerosis: Secondary | ICD-10-CM | POA: Diagnosis not present

## 2022-04-24 DIAGNOSIS — B9620 Unspecified Escherichia coli [E. coli] as the cause of diseases classified elsewhere: Secondary | ICD-10-CM | POA: Diagnosis not present

## 2022-04-24 DIAGNOSIS — E785 Hyperlipidemia, unspecified: Secondary | ICD-10-CM | POA: Diagnosis not present

## 2022-04-24 DIAGNOSIS — N390 Urinary tract infection, site not specified: Secondary | ICD-10-CM | POA: Diagnosis not present

## 2022-04-24 DIAGNOSIS — Z87891 Personal history of nicotine dependence: Secondary | ICD-10-CM | POA: Diagnosis not present

## 2022-04-24 DIAGNOSIS — Z9181 History of falling: Secondary | ICD-10-CM | POA: Diagnosis not present

## 2022-04-25 DIAGNOSIS — G35 Multiple sclerosis: Secondary | ICD-10-CM | POA: Diagnosis not present

## 2022-04-27 NOTE — Telephone Encounter (Signed)
Faxed Briumvi start form to [facility] patient support. Received a receipt of confirmation.  Briumvi start form and order given to [facility].

## 2022-04-28 DIAGNOSIS — Z87891 Personal history of nicotine dependence: Secondary | ICD-10-CM | POA: Diagnosis not present

## 2022-04-28 DIAGNOSIS — Z9181 History of falling: Secondary | ICD-10-CM | POA: Diagnosis not present

## 2022-04-28 DIAGNOSIS — E876 Hypokalemia: Secondary | ICD-10-CM | POA: Diagnosis not present

## 2022-04-28 DIAGNOSIS — R69 Illness, unspecified: Secondary | ICD-10-CM | POA: Diagnosis not present

## 2022-04-28 DIAGNOSIS — G35 Multiple sclerosis: Secondary | ICD-10-CM | POA: Diagnosis not present

## 2022-04-28 DIAGNOSIS — B954 Other streptococcus as the cause of diseases classified elsewhere: Secondary | ICD-10-CM | POA: Diagnosis not present

## 2022-04-28 DIAGNOSIS — B9620 Unspecified Escherichia coli [E. coli] as the cause of diseases classified elsewhere: Secondary | ICD-10-CM | POA: Diagnosis not present

## 2022-04-28 DIAGNOSIS — E785 Hyperlipidemia, unspecified: Secondary | ICD-10-CM | POA: Diagnosis not present

## 2022-04-28 DIAGNOSIS — N390 Urinary tract infection, site not specified: Secondary | ICD-10-CM | POA: Diagnosis not present

## 2022-04-30 DIAGNOSIS — Z9181 History of falling: Secondary | ICD-10-CM | POA: Diagnosis not present

## 2022-04-30 DIAGNOSIS — N390 Urinary tract infection, site not specified: Secondary | ICD-10-CM | POA: Diagnosis not present

## 2022-04-30 DIAGNOSIS — Z87891 Personal history of nicotine dependence: Secondary | ICD-10-CM | POA: Diagnosis not present

## 2022-04-30 DIAGNOSIS — E785 Hyperlipidemia, unspecified: Secondary | ICD-10-CM | POA: Diagnosis not present

## 2022-04-30 DIAGNOSIS — G35 Multiple sclerosis: Secondary | ICD-10-CM | POA: Diagnosis not present

## 2022-04-30 DIAGNOSIS — B954 Other streptococcus as the cause of diseases classified elsewhere: Secondary | ICD-10-CM | POA: Diagnosis not present

## 2022-04-30 DIAGNOSIS — R69 Illness, unspecified: Secondary | ICD-10-CM | POA: Diagnosis not present

## 2022-04-30 DIAGNOSIS — E876 Hypokalemia: Secondary | ICD-10-CM | POA: Diagnosis not present

## 2022-04-30 DIAGNOSIS — B9620 Unspecified Escherichia coli [E. coli] as the cause of diseases classified elsewhere: Secondary | ICD-10-CM | POA: Diagnosis not present

## 2022-05-01 DIAGNOSIS — N390 Urinary tract infection, site not specified: Secondary | ICD-10-CM | POA: Diagnosis not present

## 2022-05-01 DIAGNOSIS — B9620 Unspecified Escherichia coli [E. coli] as the cause of diseases classified elsewhere: Secondary | ICD-10-CM | POA: Diagnosis not present

## 2022-05-01 DIAGNOSIS — E876 Hypokalemia: Secondary | ICD-10-CM | POA: Diagnosis not present

## 2022-05-01 DIAGNOSIS — Z9181 History of falling: Secondary | ICD-10-CM | POA: Diagnosis not present

## 2022-05-01 DIAGNOSIS — E785 Hyperlipidemia, unspecified: Secondary | ICD-10-CM | POA: Diagnosis not present

## 2022-05-01 DIAGNOSIS — G35 Multiple sclerosis: Secondary | ICD-10-CM | POA: Diagnosis not present

## 2022-05-01 DIAGNOSIS — B954 Other streptococcus as the cause of diseases classified elsewhere: Secondary | ICD-10-CM | POA: Diagnosis not present

## 2022-05-01 DIAGNOSIS — Z87891 Personal history of nicotine dependence: Secondary | ICD-10-CM | POA: Diagnosis not present

## 2022-05-01 DIAGNOSIS — R69 Illness, unspecified: Secondary | ICD-10-CM | POA: Diagnosis not present

## 2022-05-04 DIAGNOSIS — E876 Hypokalemia: Secondary | ICD-10-CM | POA: Diagnosis not present

## 2022-05-04 DIAGNOSIS — E785 Hyperlipidemia, unspecified: Secondary | ICD-10-CM | POA: Diagnosis not present

## 2022-05-04 DIAGNOSIS — Z9181 History of falling: Secondary | ICD-10-CM | POA: Diagnosis not present

## 2022-05-04 DIAGNOSIS — G35 Multiple sclerosis: Secondary | ICD-10-CM | POA: Diagnosis not present

## 2022-05-04 DIAGNOSIS — Z87891 Personal history of nicotine dependence: Secondary | ICD-10-CM | POA: Diagnosis not present

## 2022-05-04 DIAGNOSIS — B954 Other streptococcus as the cause of diseases classified elsewhere: Secondary | ICD-10-CM | POA: Diagnosis not present

## 2022-05-04 DIAGNOSIS — N390 Urinary tract infection, site not specified: Secondary | ICD-10-CM | POA: Diagnosis not present

## 2022-05-04 DIAGNOSIS — B9620 Unspecified Escherichia coli [E. coli] as the cause of diseases classified elsewhere: Secondary | ICD-10-CM | POA: Diagnosis not present

## 2022-05-04 DIAGNOSIS — R69 Illness, unspecified: Secondary | ICD-10-CM | POA: Diagnosis not present

## 2022-05-07 DIAGNOSIS — R5381 Other malaise: Secondary | ICD-10-CM | POA: Diagnosis not present

## 2022-05-07 DIAGNOSIS — T1490XA Injury, unspecified, initial encounter: Secondary | ICD-10-CM | POA: Diagnosis not present

## 2022-05-07 DIAGNOSIS — W19XXXA Unspecified fall, initial encounter: Secondary | ICD-10-CM | POA: Diagnosis not present

## 2022-05-08 DIAGNOSIS — E785 Hyperlipidemia, unspecified: Secondary | ICD-10-CM | POA: Diagnosis not present

## 2022-05-08 DIAGNOSIS — Z87891 Personal history of nicotine dependence: Secondary | ICD-10-CM | POA: Diagnosis not present

## 2022-05-08 DIAGNOSIS — E876 Hypokalemia: Secondary | ICD-10-CM | POA: Diagnosis not present

## 2022-05-08 DIAGNOSIS — G35 Multiple sclerosis: Secondary | ICD-10-CM | POA: Diagnosis not present

## 2022-05-08 DIAGNOSIS — R69 Illness, unspecified: Secondary | ICD-10-CM | POA: Diagnosis not present

## 2022-05-08 DIAGNOSIS — Z9181 History of falling: Secondary | ICD-10-CM | POA: Diagnosis not present

## 2022-05-08 DIAGNOSIS — B954 Other streptococcus as the cause of diseases classified elsewhere: Secondary | ICD-10-CM | POA: Diagnosis not present

## 2022-05-08 DIAGNOSIS — N390 Urinary tract infection, site not specified: Secondary | ICD-10-CM | POA: Diagnosis not present

## 2022-05-08 DIAGNOSIS — B9620 Unspecified Escherichia coli [E. coli] as the cause of diseases classified elsewhere: Secondary | ICD-10-CM | POA: Diagnosis not present

## 2022-05-12 DIAGNOSIS — G35 Multiple sclerosis: Secondary | ICD-10-CM | POA: Diagnosis not present

## 2022-05-12 DIAGNOSIS — N390 Urinary tract infection, site not specified: Secondary | ICD-10-CM | POA: Diagnosis not present

## 2022-05-12 DIAGNOSIS — Z87891 Personal history of nicotine dependence: Secondary | ICD-10-CM | POA: Diagnosis not present

## 2022-05-12 DIAGNOSIS — Z9181 History of falling: Secondary | ICD-10-CM | POA: Diagnosis not present

## 2022-05-12 DIAGNOSIS — E876 Hypokalemia: Secondary | ICD-10-CM | POA: Diagnosis not present

## 2022-05-12 DIAGNOSIS — E785 Hyperlipidemia, unspecified: Secondary | ICD-10-CM | POA: Diagnosis not present

## 2022-05-12 DIAGNOSIS — B954 Other streptococcus as the cause of diseases classified elsewhere: Secondary | ICD-10-CM | POA: Diagnosis not present

## 2022-05-12 DIAGNOSIS — B9620 Unspecified Escherichia coli [E. coli] as the cause of diseases classified elsewhere: Secondary | ICD-10-CM | POA: Diagnosis not present

## 2022-05-12 DIAGNOSIS — R69 Illness, unspecified: Secondary | ICD-10-CM | POA: Diagnosis not present

## 2022-05-14 DIAGNOSIS — E876 Hypokalemia: Secondary | ICD-10-CM | POA: Diagnosis not present

## 2022-05-14 DIAGNOSIS — Z9181 History of falling: Secondary | ICD-10-CM | POA: Diagnosis not present

## 2022-05-14 DIAGNOSIS — B9620 Unspecified Escherichia coli [E. coli] as the cause of diseases classified elsewhere: Secondary | ICD-10-CM | POA: Diagnosis not present

## 2022-05-14 DIAGNOSIS — N390 Urinary tract infection, site not specified: Secondary | ICD-10-CM | POA: Diagnosis not present

## 2022-05-14 DIAGNOSIS — G35 Multiple sclerosis: Secondary | ICD-10-CM | POA: Diagnosis not present

## 2022-05-14 DIAGNOSIS — R69 Illness, unspecified: Secondary | ICD-10-CM | POA: Diagnosis not present

## 2022-05-14 DIAGNOSIS — B954 Other streptococcus as the cause of diseases classified elsewhere: Secondary | ICD-10-CM | POA: Diagnosis not present

## 2022-05-14 DIAGNOSIS — Z87891 Personal history of nicotine dependence: Secondary | ICD-10-CM | POA: Diagnosis not present

## 2022-05-14 DIAGNOSIS — E785 Hyperlipidemia, unspecified: Secondary | ICD-10-CM | POA: Diagnosis not present

## 2022-05-18 DIAGNOSIS — B954 Other streptococcus as the cause of diseases classified elsewhere: Secondary | ICD-10-CM | POA: Diagnosis not present

## 2022-05-18 DIAGNOSIS — R69 Illness, unspecified: Secondary | ICD-10-CM | POA: Diagnosis not present

## 2022-05-18 DIAGNOSIS — N390 Urinary tract infection, site not specified: Secondary | ICD-10-CM | POA: Diagnosis not present

## 2022-05-18 DIAGNOSIS — G35 Multiple sclerosis: Secondary | ICD-10-CM | POA: Diagnosis not present

## 2022-05-18 DIAGNOSIS — E876 Hypokalemia: Secondary | ICD-10-CM | POA: Diagnosis not present

## 2022-05-18 DIAGNOSIS — B9620 Unspecified Escherichia coli [E. coli] as the cause of diseases classified elsewhere: Secondary | ICD-10-CM | POA: Diagnosis not present

## 2022-05-18 DIAGNOSIS — E785 Hyperlipidemia, unspecified: Secondary | ICD-10-CM | POA: Diagnosis not present

## 2022-05-18 DIAGNOSIS — Z87891 Personal history of nicotine dependence: Secondary | ICD-10-CM | POA: Diagnosis not present

## 2022-05-18 DIAGNOSIS — Z9181 History of falling: Secondary | ICD-10-CM | POA: Diagnosis not present

## 2022-05-21 DIAGNOSIS — R69 Illness, unspecified: Secondary | ICD-10-CM | POA: Diagnosis not present

## 2022-05-21 DIAGNOSIS — E876 Hypokalemia: Secondary | ICD-10-CM | POA: Diagnosis not present

## 2022-05-21 DIAGNOSIS — Z87891 Personal history of nicotine dependence: Secondary | ICD-10-CM | POA: Diagnosis not present

## 2022-05-21 DIAGNOSIS — B9620 Unspecified Escherichia coli [E. coli] as the cause of diseases classified elsewhere: Secondary | ICD-10-CM | POA: Diagnosis not present

## 2022-05-21 DIAGNOSIS — N390 Urinary tract infection, site not specified: Secondary | ICD-10-CM | POA: Diagnosis not present

## 2022-05-21 DIAGNOSIS — B954 Other streptococcus as the cause of diseases classified elsewhere: Secondary | ICD-10-CM | POA: Diagnosis not present

## 2022-05-21 DIAGNOSIS — G35 Multiple sclerosis: Secondary | ICD-10-CM | POA: Diagnosis not present

## 2022-05-21 DIAGNOSIS — E785 Hyperlipidemia, unspecified: Secondary | ICD-10-CM | POA: Diagnosis not present

## 2022-05-21 DIAGNOSIS — Z9181 History of falling: Secondary | ICD-10-CM | POA: Diagnosis not present

## 2022-05-21 NOTE — Telephone Encounter (Signed)
Asked Kim/[facility] to call pt with update.

## 2022-05-21 NOTE — Telephone Encounter (Signed)
Pt said medication for infusion is going to cost a $1,000. Would like a call from the  nurse to discuss a cheaper medication.

## 2022-05-22 DIAGNOSIS — N390 Urinary tract infection, site not specified: Secondary | ICD-10-CM | POA: Diagnosis not present

## 2022-05-22 DIAGNOSIS — R69 Illness, unspecified: Secondary | ICD-10-CM | POA: Diagnosis not present

## 2022-05-22 DIAGNOSIS — Z87891 Personal history of nicotine dependence: Secondary | ICD-10-CM | POA: Diagnosis not present

## 2022-05-22 DIAGNOSIS — B954 Other streptococcus as the cause of diseases classified elsewhere: Secondary | ICD-10-CM | POA: Diagnosis not present

## 2022-05-22 DIAGNOSIS — E876 Hypokalemia: Secondary | ICD-10-CM | POA: Diagnosis not present

## 2022-05-22 DIAGNOSIS — B9620 Unspecified Escherichia coli [E. coli] as the cause of diseases classified elsewhere: Secondary | ICD-10-CM | POA: Diagnosis not present

## 2022-05-22 DIAGNOSIS — G35 Multiple sclerosis: Secondary | ICD-10-CM | POA: Diagnosis not present

## 2022-05-22 DIAGNOSIS — E785 Hyperlipidemia, unspecified: Secondary | ICD-10-CM | POA: Diagnosis not present

## 2022-05-22 DIAGNOSIS — Z9181 History of falling: Secondary | ICD-10-CM | POA: Diagnosis not present

## 2022-05-25 DIAGNOSIS — G35 Multiple sclerosis: Secondary | ICD-10-CM | POA: Diagnosis not present

## 2022-05-27 DIAGNOSIS — E785 Hyperlipidemia, unspecified: Secondary | ICD-10-CM | POA: Diagnosis not present

## 2022-05-27 DIAGNOSIS — R69 Illness, unspecified: Secondary | ICD-10-CM | POA: Diagnosis not present

## 2022-05-27 DIAGNOSIS — G35 Multiple sclerosis: Secondary | ICD-10-CM | POA: Diagnosis not present

## 2022-05-27 DIAGNOSIS — B954 Other streptococcus as the cause of diseases classified elsewhere: Secondary | ICD-10-CM | POA: Diagnosis not present

## 2022-05-27 DIAGNOSIS — Z87891 Personal history of nicotine dependence: Secondary | ICD-10-CM | POA: Diagnosis not present

## 2022-05-27 DIAGNOSIS — E876 Hypokalemia: Secondary | ICD-10-CM | POA: Diagnosis not present

## 2022-05-27 DIAGNOSIS — N390 Urinary tract infection, site not specified: Secondary | ICD-10-CM | POA: Diagnosis not present

## 2022-05-27 DIAGNOSIS — Z9181 History of falling: Secondary | ICD-10-CM | POA: Diagnosis not present

## 2022-05-27 DIAGNOSIS — B9620 Unspecified Escherichia coli [E. coli] as the cause of diseases classified elsewhere: Secondary | ICD-10-CM | POA: Diagnosis not present

## 2022-05-29 DIAGNOSIS — R69 Illness, unspecified: Secondary | ICD-10-CM | POA: Diagnosis not present

## 2022-05-29 DIAGNOSIS — N390 Urinary tract infection, site not specified: Secondary | ICD-10-CM | POA: Diagnosis not present

## 2022-05-29 DIAGNOSIS — Z9181 History of falling: Secondary | ICD-10-CM | POA: Diagnosis not present

## 2022-05-29 DIAGNOSIS — B9620 Unspecified Escherichia coli [E. coli] as the cause of diseases classified elsewhere: Secondary | ICD-10-CM | POA: Diagnosis not present

## 2022-05-29 DIAGNOSIS — G35 Multiple sclerosis: Secondary | ICD-10-CM | POA: Diagnosis not present

## 2022-05-29 DIAGNOSIS — E785 Hyperlipidemia, unspecified: Secondary | ICD-10-CM | POA: Diagnosis not present

## 2022-05-29 DIAGNOSIS — E876 Hypokalemia: Secondary | ICD-10-CM | POA: Diagnosis not present

## 2022-05-29 DIAGNOSIS — Z87891 Personal history of nicotine dependence: Secondary | ICD-10-CM | POA: Diagnosis not present

## 2022-05-29 DIAGNOSIS — B954 Other streptococcus as the cause of diseases classified elsewhere: Secondary | ICD-10-CM | POA: Diagnosis not present

## 2022-06-02 DIAGNOSIS — Z87891 Personal history of nicotine dependence: Secondary | ICD-10-CM | POA: Diagnosis not present

## 2022-06-02 DIAGNOSIS — Z9181 History of falling: Secondary | ICD-10-CM | POA: Diagnosis not present

## 2022-06-02 DIAGNOSIS — G35 Multiple sclerosis: Secondary | ICD-10-CM | POA: Diagnosis not present

## 2022-06-02 DIAGNOSIS — E785 Hyperlipidemia, unspecified: Secondary | ICD-10-CM | POA: Diagnosis not present

## 2022-06-02 DIAGNOSIS — N390 Urinary tract infection, site not specified: Secondary | ICD-10-CM | POA: Diagnosis not present

## 2022-06-02 DIAGNOSIS — B954 Other streptococcus as the cause of diseases classified elsewhere: Secondary | ICD-10-CM | POA: Diagnosis not present

## 2022-06-02 DIAGNOSIS — B9620 Unspecified Escherichia coli [E. coli] as the cause of diseases classified elsewhere: Secondary | ICD-10-CM | POA: Diagnosis not present

## 2022-06-02 DIAGNOSIS — E876 Hypokalemia: Secondary | ICD-10-CM | POA: Diagnosis not present

## 2022-06-02 DIAGNOSIS — R69 Illness, unspecified: Secondary | ICD-10-CM | POA: Diagnosis not present

## 2022-06-05 ENCOUNTER — Telehealth: Payer: Self-pay | Admitting: Neurology

## 2022-06-05 DIAGNOSIS — Z87891 Personal history of nicotine dependence: Secondary | ICD-10-CM | POA: Diagnosis not present

## 2022-06-05 DIAGNOSIS — N390 Urinary tract infection, site not specified: Secondary | ICD-10-CM | POA: Diagnosis not present

## 2022-06-05 DIAGNOSIS — R69 Illness, unspecified: Secondary | ICD-10-CM | POA: Diagnosis not present

## 2022-06-05 DIAGNOSIS — B954 Other streptococcus as the cause of diseases classified elsewhere: Secondary | ICD-10-CM | POA: Diagnosis not present

## 2022-06-05 DIAGNOSIS — E785 Hyperlipidemia, unspecified: Secondary | ICD-10-CM | POA: Diagnosis not present

## 2022-06-05 DIAGNOSIS — E876 Hypokalemia: Secondary | ICD-10-CM | POA: Diagnosis not present

## 2022-06-05 DIAGNOSIS — Z9181 History of falling: Secondary | ICD-10-CM | POA: Diagnosis not present

## 2022-06-05 DIAGNOSIS — G35 Multiple sclerosis: Secondary | ICD-10-CM | POA: Diagnosis not present

## 2022-06-05 DIAGNOSIS — B9620 Unspecified Escherichia coli [E. coli] as the cause of diseases classified elsewhere: Secondary | ICD-10-CM | POA: Diagnosis not present

## 2022-06-05 NOTE — Telephone Encounter (Signed)
Pt is calling. Said she got a [place] manager from her insurance stating they will pay for IV injections for a year. Pt said she is ready to start new medication because her legs are cramping up bad. Pt is requesting a call back from nurse.

## 2022-06-08 NOTE — Telephone Encounter (Signed)
I have reached out to the infusion suite for an update.

## 2022-06-08 NOTE — Telephone Encounter (Signed)
Received this notice from [facility]: "Yes, we have been waiting for income verification from this patient. I spoke with her on 10/10 and explained that we will not be able to proceed with evaluating her for assistance without this documentation. She explained that she would have her caregiver to send the information. I provided her with the fax information as well as the mailing address."
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
## 2022-06-08 NOTE — Telephone Encounter (Signed)
I called patient. I asked her to call the [place] Patient Assistance program at [phone] to provide her income verification. I have also reached out to [place] at the [place] program and asked her to assist the patient.

## 2022-06-08 NOTE — Telephone Encounter (Signed)
I spoke with Erin Mercury, RN in infusion suite. Erin Berg with [facility] spoke with patient on 05/27/22 and is screening her for the free drug program since patient cannot afford her copay. They need patient's income information to be sent to [facility] before she can be screened.

## 2022-06-09 DIAGNOSIS — Z9181 History of falling: Secondary | ICD-10-CM | POA: Diagnosis not present

## 2022-06-09 DIAGNOSIS — E876 Hypokalemia: Secondary | ICD-10-CM | POA: Diagnosis not present

## 2022-06-09 DIAGNOSIS — R69 Illness, unspecified: Secondary | ICD-10-CM | POA: Diagnosis not present

## 2022-06-09 DIAGNOSIS — E785 Hyperlipidemia, unspecified: Secondary | ICD-10-CM | POA: Diagnosis not present

## 2022-06-09 DIAGNOSIS — G35 Multiple sclerosis: Secondary | ICD-10-CM | POA: Diagnosis not present

## 2022-06-09 DIAGNOSIS — Z87891 Personal history of nicotine dependence: Secondary | ICD-10-CM | POA: Diagnosis not present

## 2022-06-09 DIAGNOSIS — B9620 Unspecified Escherichia coli [E. coli] as the cause of diseases classified elsewhere: Secondary | ICD-10-CM | POA: Diagnosis not present

## 2022-06-09 DIAGNOSIS — N390 Urinary tract infection, site not specified: Secondary | ICD-10-CM | POA: Diagnosis not present

## 2022-06-09 DIAGNOSIS — B954 Other streptococcus as the cause of diseases classified elsewhere: Secondary | ICD-10-CM | POA: Diagnosis not present

## 2022-06-11 DIAGNOSIS — N390 Urinary tract infection, site not specified: Secondary | ICD-10-CM | POA: Diagnosis not present

## 2022-06-11 DIAGNOSIS — B954 Other streptococcus as the cause of diseases classified elsewhere: Secondary | ICD-10-CM | POA: Diagnosis not present

## 2022-06-11 DIAGNOSIS — Z87891 Personal history of nicotine dependence: Secondary | ICD-10-CM | POA: Diagnosis not present

## 2022-06-11 DIAGNOSIS — E785 Hyperlipidemia, unspecified: Secondary | ICD-10-CM | POA: Diagnosis not present

## 2022-06-11 DIAGNOSIS — G35 Multiple sclerosis: Secondary | ICD-10-CM | POA: Diagnosis not present

## 2022-06-11 DIAGNOSIS — R69 Illness, unspecified: Secondary | ICD-10-CM | POA: Diagnosis not present

## 2022-06-11 DIAGNOSIS — Z9181 History of falling: Secondary | ICD-10-CM | POA: Diagnosis not present

## 2022-06-11 DIAGNOSIS — B9620 Unspecified Escherichia coli [E. coli] as the cause of diseases classified elsewhere: Secondary | ICD-10-CM | POA: Diagnosis not present

## 2022-06-11 DIAGNOSIS — E876 Hypokalemia: Secondary | ICD-10-CM | POA: Diagnosis not present

## 2022-06-15 DIAGNOSIS — G35 Multiple sclerosis: Secondary | ICD-10-CM | POA: Diagnosis not present

## 2022-06-15 DIAGNOSIS — Z9181 History of falling: Secondary | ICD-10-CM | POA: Diagnosis not present

## 2022-06-15 DIAGNOSIS — B954 Other streptococcus as the cause of diseases classified elsewhere: Secondary | ICD-10-CM | POA: Diagnosis not present

## 2022-06-15 DIAGNOSIS — E785 Hyperlipidemia, unspecified: Secondary | ICD-10-CM | POA: Diagnosis not present

## 2022-06-15 DIAGNOSIS — R69 Illness, unspecified: Secondary | ICD-10-CM | POA: Diagnosis not present

## 2022-06-15 DIAGNOSIS — Z87891 Personal history of nicotine dependence: Secondary | ICD-10-CM | POA: Diagnosis not present

## 2022-06-15 DIAGNOSIS — N390 Urinary tract infection, site not specified: Secondary | ICD-10-CM | POA: Diagnosis not present

## 2022-06-15 DIAGNOSIS — B9620 Unspecified Escherichia coli [E. coli] as the cause of diseases classified elsewhere: Secondary | ICD-10-CM | POA: Diagnosis not present

## 2022-06-15 DIAGNOSIS — E876 Hypokalemia: Secondary | ICD-10-CM | POA: Diagnosis not present

## 2022-06-25 ENCOUNTER — Other Ambulatory Visit: Payer: Self-pay | Admitting: Internal Medicine

## 2022-06-25 DIAGNOSIS — Z1231 Encounter for screening mammogram for malignant neoplasm of breast: Secondary | ICD-10-CM

## 2022-06-29 NOTE — Telephone Encounter (Signed)
I called patient.  She reports that he received her letter from [employer] explaining that she has disability benefits.  She has it sitting on her nightstand because she cannot remember where to send it.  I will ask the [facility] support team to reach out to patient and discuss this further.  Patient verbalized understanding.  I have reached out to [place] at [facility] patient support.

## 2022-06-30 DIAGNOSIS — E785 Hyperlipidemia, unspecified: Secondary | ICD-10-CM | POA: Diagnosis not present

## 2022-06-30 DIAGNOSIS — B954 Other streptococcus as the cause of diseases classified elsewhere: Secondary | ICD-10-CM | POA: Diagnosis not present

## 2022-06-30 DIAGNOSIS — Z87891 Personal history of nicotine dependence: Secondary | ICD-10-CM | POA: Diagnosis not present

## 2022-06-30 DIAGNOSIS — B9620 Unspecified Escherichia coli [E. coli] as the cause of diseases classified elsewhere: Secondary | ICD-10-CM | POA: Diagnosis not present

## 2022-06-30 DIAGNOSIS — E876 Hypokalemia: Secondary | ICD-10-CM | POA: Diagnosis not present

## 2022-06-30 DIAGNOSIS — R69 Illness, unspecified: Secondary | ICD-10-CM | POA: Diagnosis not present

## 2022-06-30 DIAGNOSIS — G35 Multiple sclerosis: Secondary | ICD-10-CM | POA: Diagnosis not present

## 2022-06-30 DIAGNOSIS — Z9181 History of falling: Secondary | ICD-10-CM | POA: Diagnosis not present

## 2022-06-30 DIAGNOSIS — N390 Urinary tract infection, site not specified: Secondary | ICD-10-CM | POA: Diagnosis not present

## 2022-07-14 DIAGNOSIS — Z9181 History of falling: Secondary | ICD-10-CM | POA: Diagnosis not present

## 2022-07-14 DIAGNOSIS — B9620 Unspecified Escherichia coli [E. coli] as the cause of diseases classified elsewhere: Secondary | ICD-10-CM | POA: Diagnosis not present

## 2022-07-14 DIAGNOSIS — Z87891 Personal history of nicotine dependence: Secondary | ICD-10-CM | POA: Diagnosis not present

## 2022-07-14 DIAGNOSIS — N390 Urinary tract infection, site not specified: Secondary | ICD-10-CM | POA: Diagnosis not present

## 2022-07-14 DIAGNOSIS — G35 Multiple sclerosis: Secondary | ICD-10-CM | POA: Diagnosis not present

## 2022-07-14 DIAGNOSIS — R69 Illness, unspecified: Secondary | ICD-10-CM | POA: Diagnosis not present

## 2022-07-14 DIAGNOSIS — B954 Other streptococcus as the cause of diseases classified elsewhere: Secondary | ICD-10-CM | POA: Diagnosis not present

## 2022-07-14 DIAGNOSIS — E876 Hypokalemia: Secondary | ICD-10-CM | POA: Diagnosis not present

## 2022-07-14 DIAGNOSIS — E785 Hyperlipidemia, unspecified: Secondary | ICD-10-CM | POA: Diagnosis not present

## 2022-07-20 DIAGNOSIS — Z993 Dependence on wheelchair: Secondary | ICD-10-CM | POA: Diagnosis not present

## 2022-07-20 DIAGNOSIS — Z87891 Personal history of nicotine dependence: Secondary | ICD-10-CM | POA: Diagnosis not present

## 2022-07-20 DIAGNOSIS — G35 Multiple sclerosis: Secondary | ICD-10-CM | POA: Diagnosis not present

## 2022-07-20 DIAGNOSIS — D84821 Immunodeficiency due to drugs: Secondary | ICD-10-CM | POA: Diagnosis not present

## 2022-07-20 DIAGNOSIS — I739 Peripheral vascular disease, unspecified: Secondary | ICD-10-CM | POA: Diagnosis not present

## 2022-07-20 DIAGNOSIS — E785 Hyperlipidemia, unspecified: Secondary | ICD-10-CM | POA: Diagnosis not present

## 2022-07-20 DIAGNOSIS — Z882 Allergy status to sulfonamides status: Secondary | ICD-10-CM | POA: Diagnosis not present

## 2022-07-20 DIAGNOSIS — G629 Polyneuropathy, unspecified: Secondary | ICD-10-CM | POA: Diagnosis not present

## 2022-07-20 DIAGNOSIS — Z5982 Transportation insecurity: Secondary | ICD-10-CM | POA: Diagnosis not present

## 2022-07-20 DIAGNOSIS — R32 Unspecified urinary incontinence: Secondary | ICD-10-CM | POA: Diagnosis not present

## 2022-07-20 DIAGNOSIS — Z008 Encounter for other general examination: Secondary | ICD-10-CM | POA: Diagnosis not present

## 2022-07-20 DIAGNOSIS — E669 Obesity, unspecified: Secondary | ICD-10-CM | POA: Diagnosis not present

## 2022-07-28 NOTE — Telephone Encounter (Signed)
I have reached out to [facility] Patient Support to find out the status of this.

## 2022-07-28 NOTE — Telephone Encounter (Signed)
Per Marzetta Board at [facility]: "Hello- my case manager has been reaching out to this patient consistently to get the required documentation for PAP, but as of this morning, we have not received anything from her. We need documentation of her income/SS disability award and then we can move forward with her PAP.

## 2022-07-28 NOTE — Telephone Encounter (Signed)
I called patient. She denies receiving any calls from [facility] Patient Support. She has her SS/income paper work and needs a call from [employer] Patient Support on how to get this to them. I have asked [facility] to call her at the home number listed per patient request.

## 2022-08-04 DIAGNOSIS — Z6835 Body mass index (BMI) 35.0-35.9, adult: Secondary | ICD-10-CM | POA: Diagnosis not present

## 2022-08-04 DIAGNOSIS — E6609 Other obesity due to excess calories: Secondary | ICD-10-CM | POA: Diagnosis not present

## 2022-08-04 DIAGNOSIS — R5381 Other malaise: Secondary | ICD-10-CM | POA: Diagnosis not present

## 2022-08-04 DIAGNOSIS — G35 Multiple sclerosis: Secondary | ICD-10-CM | POA: Diagnosis not present

## 2022-08-06 NOTE — Telephone Encounter (Signed)
Received this from [place] at [facility] [facility] Patient Support: "I wanted to follow up with regarding Erin Berg Nov 13, 1965. My case manager, Elmyra Ricks, was able to get in touch with her and she reviewed options for getting the information to our HUB so we can move forward with the PAP process. The patient verbalized understanding. My case manager also spoke with a caregiver, who stated she did not believe the patient had the documents we were requesting. At this time, we have not received anything from the patient. We will continue to follow up with her.   If the patient was able to get the documents to you and you could then email them to me, that would work also."

## 2022-08-07 ENCOUNTER — Ambulatory Visit
Admission: RE | Admit: 2022-08-07 | Discharge: 2022-08-07 | Disposition: A | Discharge status: Home / Self Care | Payer: Medicare HMO | Source: Ambulatory Visit | Attending: Internal Medicine | Admitting: Internal Medicine

## 2022-08-07 DIAGNOSIS — Z1231 Encounter for screening mammogram for malignant neoplasm of breast: Secondary | ICD-10-CM | POA: Diagnosis not present

## 2022-08-07 DIAGNOSIS — Z6834 Body mass index (BMI) 34.0-34.9, adult: Secondary | ICD-10-CM | POA: Diagnosis not present

## 2022-08-07 DIAGNOSIS — G35 Multiple sclerosis: Secondary | ICD-10-CM | POA: Diagnosis not present

## 2022-08-07 DIAGNOSIS — R69 Illness, unspecified: Secondary | ICD-10-CM | POA: Diagnosis not present

## 2022-08-07 DIAGNOSIS — E668 Other obesity: Secondary | ICD-10-CM | POA: Diagnosis not present

## 2022-08-07 DIAGNOSIS — Z9181 History of falling: Secondary | ICD-10-CM | POA: Diagnosis not present

## 2022-08-12 DIAGNOSIS — G35 Multiple sclerosis: Secondary | ICD-10-CM | POA: Diagnosis not present

## 2022-08-12 DIAGNOSIS — Z9181 History of falling: Secondary | ICD-10-CM | POA: Diagnosis not present

## 2022-08-12 DIAGNOSIS — Z6834 Body mass index (BMI) 34.0-34.9, adult: Secondary | ICD-10-CM | POA: Diagnosis not present

## 2022-08-12 DIAGNOSIS — R69 Illness, unspecified: Secondary | ICD-10-CM | POA: Diagnosis not present

## 2022-08-12 DIAGNOSIS — E668 Other obesity: Secondary | ICD-10-CM | POA: Diagnosis not present

## 2022-08-13 DIAGNOSIS — E668 Other obesity: Secondary | ICD-10-CM | POA: Diagnosis not present

## 2022-08-13 DIAGNOSIS — G35 Multiple sclerosis: Secondary | ICD-10-CM | POA: Diagnosis not present

## 2022-08-13 DIAGNOSIS — Z6834 Body mass index (BMI) 34.0-34.9, adult: Secondary | ICD-10-CM | POA: Diagnosis not present

## 2022-08-13 DIAGNOSIS — Z9181 History of falling: Secondary | ICD-10-CM | POA: Diagnosis not present

## 2022-08-13 DIAGNOSIS — R69 Illness, unspecified: Secondary | ICD-10-CM | POA: Diagnosis not present

## 2022-08-20 DIAGNOSIS — E668 Other obesity: Secondary | ICD-10-CM | POA: Diagnosis not present

## 2022-08-20 DIAGNOSIS — G35 Multiple sclerosis: Secondary | ICD-10-CM | POA: Diagnosis not present

## 2022-08-20 DIAGNOSIS — Z6834 Body mass index (BMI) 34.0-34.9, adult: Secondary | ICD-10-CM | POA: Diagnosis not present

## 2022-08-20 DIAGNOSIS — Z9181 History of falling: Secondary | ICD-10-CM | POA: Diagnosis not present

## 2022-08-20 DIAGNOSIS — R69 Illness, unspecified: Secondary | ICD-10-CM | POA: Diagnosis not present

## 2022-08-21 DIAGNOSIS — E668 Other obesity: Secondary | ICD-10-CM | POA: Diagnosis not present

## 2022-08-21 DIAGNOSIS — Z6834 Body mass index (BMI) 34.0-34.9, adult: Secondary | ICD-10-CM | POA: Diagnosis not present

## 2022-08-21 DIAGNOSIS — Z9181 History of falling: Secondary | ICD-10-CM | POA: Diagnosis not present

## 2022-08-21 DIAGNOSIS — G35 Multiple sclerosis: Secondary | ICD-10-CM | POA: Diagnosis not present

## 2022-08-21 DIAGNOSIS — R69 Illness, unspecified: Secondary | ICD-10-CM | POA: Diagnosis not present

## 2022-08-24 DIAGNOSIS — Z9181 History of falling: Secondary | ICD-10-CM | POA: Diagnosis not present

## 2022-08-24 DIAGNOSIS — R69 Illness, unspecified: Secondary | ICD-10-CM | POA: Diagnosis not present

## 2022-08-24 DIAGNOSIS — G35 Multiple sclerosis: Secondary | ICD-10-CM | POA: Diagnosis not present

## 2022-08-25 DIAGNOSIS — Z6834 Body mass index (BMI) 34.0-34.9, adult: Secondary | ICD-10-CM | POA: Diagnosis not present

## 2022-08-25 DIAGNOSIS — G35 Multiple sclerosis: Secondary | ICD-10-CM | POA: Diagnosis not present

## 2022-08-25 DIAGNOSIS — E668 Other obesity: Secondary | ICD-10-CM | POA: Diagnosis not present

## 2022-08-25 DIAGNOSIS — Z9181 History of falling: Secondary | ICD-10-CM | POA: Diagnosis not present

## 2022-08-25 DIAGNOSIS — R69 Illness, unspecified: Secondary | ICD-10-CM | POA: Diagnosis not present

## 2022-08-25 NOTE — Telephone Encounter (Signed)
Patient returned my call. She reports that she mailed the requested income verification documents to [facility] months ago. I advised her that as of 2 weeks ago, they had not received those documents. She does not have a computer and is unsure of how to use her smart phone so feels that emailing them will not work.  Patient is due for an appointment with Dr. Felecia Shelling. An appointment was scheduled for 09/14/22 at 1:00pm. She will bring the documents she mailed to [facility] with her to this appointment so that staff can help her get them to [facility] support. Pt verbalized understanding.

## 2022-08-25 NOTE — Telephone Encounter (Signed)
I spoke with Dr. Felecia Shelling. He asked that I attempt to discuss the importance of sending the PAP back to [facility] assistance. If she is unable to do this, he would like her ot come for an appointment.  I called patient to discuss. No answer, left a message asking her to call us back. Please route calls to POD 1.

## 2022-08-27 DIAGNOSIS — Z6834 Body mass index (BMI) 34.0-34.9, adult: Secondary | ICD-10-CM | POA: Diagnosis not present

## 2022-08-27 DIAGNOSIS — Z9181 History of falling: Secondary | ICD-10-CM | POA: Diagnosis not present

## 2022-08-27 DIAGNOSIS — G35 Multiple sclerosis: Secondary | ICD-10-CM | POA: Diagnosis not present

## 2022-08-27 DIAGNOSIS — R69 Illness, unspecified: Secondary | ICD-10-CM | POA: Diagnosis not present

## 2022-08-27 DIAGNOSIS — E668 Other obesity: Secondary | ICD-10-CM | POA: Diagnosis not present

## 2022-09-01 DIAGNOSIS — Z6834 Body mass index (BMI) 34.0-34.9, adult: Secondary | ICD-10-CM | POA: Diagnosis not present

## 2022-09-01 DIAGNOSIS — E668 Other obesity: Secondary | ICD-10-CM | POA: Diagnosis not present

## 2022-09-01 DIAGNOSIS — G35 Multiple sclerosis: Secondary | ICD-10-CM | POA: Diagnosis not present

## 2022-09-01 DIAGNOSIS — R69 Illness, unspecified: Secondary | ICD-10-CM | POA: Diagnosis not present

## 2022-09-01 DIAGNOSIS — Z9181 History of falling: Secondary | ICD-10-CM | POA: Diagnosis not present

## 2022-09-03 DIAGNOSIS — R69 Illness, unspecified: Secondary | ICD-10-CM | POA: Diagnosis not present

## 2022-09-03 DIAGNOSIS — E668 Other obesity: Secondary | ICD-10-CM | POA: Diagnosis not present

## 2022-09-03 DIAGNOSIS — Z6834 Body mass index (BMI) 34.0-34.9, adult: Secondary | ICD-10-CM | POA: Diagnosis not present

## 2022-09-03 DIAGNOSIS — G35 Multiple sclerosis: Secondary | ICD-10-CM | POA: Diagnosis not present

## 2022-09-03 DIAGNOSIS — Z9181 History of falling: Secondary | ICD-10-CM | POA: Diagnosis not present

## 2022-09-03 NOTE — Telephone Encounter (Signed)
Received this notice from Erline Levine at [facility] Patient Support: "Good afternoon! Our case manager, Elmyra Ricks, spoke with Sabino Niemann this afternoon and the patient stated that her father will be dropping off the financial paperwork we are waiting on to your office on 1/19.   Please let me know once you have the paperwork. It can be faxed to [place] at [phone]"

## 2022-09-09 DIAGNOSIS — E668 Other obesity: Secondary | ICD-10-CM | POA: Diagnosis not present

## 2022-09-09 DIAGNOSIS — G35 Multiple sclerosis: Secondary | ICD-10-CM | POA: Diagnosis not present

## 2022-09-09 DIAGNOSIS — R69 Illness, unspecified: Secondary | ICD-10-CM | POA: Diagnosis not present

## 2022-09-09 DIAGNOSIS — Z6834 Body mass index (BMI) 34.0-34.9, adult: Secondary | ICD-10-CM | POA: Diagnosis not present

## 2022-09-09 DIAGNOSIS — Z9181 History of falling: Secondary | ICD-10-CM | POA: Diagnosis not present

## 2022-09-09 NOTE — Telephone Encounter (Signed)
Faxed social security financial info to [place] pt support at [phone]. Received fax confirmation.

## 2022-09-14 ENCOUNTER — Ambulatory Visit: Payer: Medicare HMO | Admitting: Neurology

## 2022-09-15 DIAGNOSIS — Z6834 Body mass index (BMI) 34.0-34.9, adult: Secondary | ICD-10-CM | POA: Diagnosis not present

## 2022-09-15 DIAGNOSIS — E668 Other obesity: Secondary | ICD-10-CM | POA: Diagnosis not present

## 2022-09-15 DIAGNOSIS — G35 Multiple sclerosis: Secondary | ICD-10-CM | POA: Diagnosis not present

## 2022-09-15 DIAGNOSIS — Z9181 History of falling: Secondary | ICD-10-CM | POA: Diagnosis not present

## 2022-09-15 DIAGNOSIS — R69 Illness, unspecified: Secondary | ICD-10-CM | POA: Diagnosis not present

## 2022-09-23 DIAGNOSIS — E668 Other obesity: Secondary | ICD-10-CM | POA: Diagnosis not present

## 2022-09-23 DIAGNOSIS — G35 Multiple sclerosis: Secondary | ICD-10-CM | POA: Diagnosis not present

## 2022-09-23 DIAGNOSIS — Z6834 Body mass index (BMI) 34.0-34.9, adult: Secondary | ICD-10-CM | POA: Diagnosis not present

## 2022-09-23 DIAGNOSIS — R69 Illness, unspecified: Secondary | ICD-10-CM | POA: Diagnosis not present

## 2022-09-23 DIAGNOSIS — Z9181 History of falling: Secondary | ICD-10-CM | POA: Diagnosis not present

## 2022-09-29 NOTE — Telephone Encounter (Signed)
Received this notice from [place] PAP: "We received her financial information, and she was approved for our PAP program. We have the drug ready to ship and have reached out to the infusion team a few times to complete the attestation and confirm shipment dates. Last communication stated that [facility] was waiting on admin clearance before the patient could be scheduled. I wanted to make sure you are aware and if you have any additional updates you could share, that would be great."

## 2022-09-29 NOTE — Telephone Encounter (Signed)
I have sent this information to Maudie Mercury, RN and [place] in our infusion suite.

## 2022-10-02 DIAGNOSIS — E668 Other obesity: Secondary | ICD-10-CM | POA: Diagnosis not present

## 2022-10-02 DIAGNOSIS — Z9181 History of falling: Secondary | ICD-10-CM | POA: Diagnosis not present

## 2022-10-02 DIAGNOSIS — G35 Multiple sclerosis: Secondary | ICD-10-CM | POA: Diagnosis not present

## 2022-10-02 DIAGNOSIS — R69 Illness, unspecified: Secondary | ICD-10-CM | POA: Diagnosis not present

## 2022-10-02 DIAGNOSIS — Z6834 Body mass index (BMI) 34.0-34.9, adult: Secondary | ICD-10-CM | POA: Diagnosis not present

## 2022-10-15 DIAGNOSIS — E785 Hyperlipidemia, unspecified: Secondary | ICD-10-CM | POA: Diagnosis not present

## 2022-10-15 DIAGNOSIS — R69 Illness, unspecified: Secondary | ICD-10-CM | POA: Diagnosis not present

## 2022-10-15 DIAGNOSIS — J302 Other seasonal allergic rhinitis: Secondary | ICD-10-CM | POA: Diagnosis not present

## 2022-10-15 DIAGNOSIS — G35 Multiple sclerosis: Secondary | ICD-10-CM | POA: Diagnosis not present

## 2022-11-16 DIAGNOSIS — E669 Obesity, unspecified: Secondary | ICD-10-CM | POA: Diagnosis not present

## 2022-11-16 DIAGNOSIS — Z8249 Family history of ischemic heart disease and other diseases of the circulatory system: Secondary | ICD-10-CM | POA: Diagnosis not present

## 2022-11-16 DIAGNOSIS — Z008 Encounter for other general examination: Secondary | ICD-10-CM | POA: Diagnosis not present

## 2022-11-16 DIAGNOSIS — J302 Other seasonal allergic rhinitis: Secondary | ICD-10-CM | POA: Diagnosis not present

## 2022-11-16 DIAGNOSIS — D84821 Immunodeficiency due to drugs: Secondary | ICD-10-CM | POA: Diagnosis not present

## 2022-11-16 DIAGNOSIS — E785 Hyperlipidemia, unspecified: Secondary | ICD-10-CM | POA: Diagnosis not present

## 2022-11-16 DIAGNOSIS — F3341 Major depressive disorder, recurrent, in partial remission: Secondary | ICD-10-CM | POA: Diagnosis not present

## 2022-11-16 DIAGNOSIS — Z809 Family history of malignant neoplasm, unspecified: Secondary | ICD-10-CM | POA: Diagnosis not present

## 2022-11-16 DIAGNOSIS — G35 Multiple sclerosis: Secondary | ICD-10-CM | POA: Diagnosis not present

## 2022-11-16 DIAGNOSIS — F419 Anxiety disorder, unspecified: Secondary | ICD-10-CM | POA: Diagnosis not present

## 2022-11-16 DIAGNOSIS — R32 Unspecified urinary incontinence: Secondary | ICD-10-CM | POA: Diagnosis not present

## 2022-11-16 DIAGNOSIS — I10 Essential (primary) hypertension: Secondary | ICD-10-CM | POA: Diagnosis not present

## 2022-11-16 DIAGNOSIS — R269 Unspecified abnormalities of gait and mobility: Secondary | ICD-10-CM | POA: Diagnosis not present

## 2022-11-16 DIAGNOSIS — R69 Illness, unspecified: Secondary | ICD-10-CM | POA: Diagnosis not present

## 2022-11-18 NOTE — Telephone Encounter (Signed)
Received the following letter below. Provided to [facility].

## 2022-11-25 DIAGNOSIS — R5381 Other malaise: Secondary | ICD-10-CM | POA: Diagnosis not present

## 2022-11-25 DIAGNOSIS — Z9181 History of falling: Secondary | ICD-10-CM | POA: Diagnosis not present

## 2022-11-25 DIAGNOSIS — R69 Illness, unspecified: Secondary | ICD-10-CM | POA: Diagnosis not present

## 2022-11-25 DIAGNOSIS — G35 Multiple sclerosis: Secondary | ICD-10-CM | POA: Diagnosis not present

## 2022-11-30 DIAGNOSIS — G35 Multiple sclerosis: Secondary | ICD-10-CM | POA: Diagnosis not present

## 2022-12-01 DIAGNOSIS — R69 Illness, unspecified: Secondary | ICD-10-CM | POA: Diagnosis not present

## 2022-12-08 NOTE — Telephone Encounter (Signed)
Patient's first Briumvi infusion was November 30, 2022.

## 2022-12-14 DIAGNOSIS — G35 Multiple sclerosis: Secondary | ICD-10-CM | POA: Diagnosis not present

## 2022-12-16 DIAGNOSIS — G35 Multiple sclerosis: Secondary | ICD-10-CM | POA: Diagnosis not present

## 2022-12-16 DIAGNOSIS — Z9181 History of falling: Secondary | ICD-10-CM | POA: Diagnosis not present

## 2022-12-16 DIAGNOSIS — R5381 Other malaise: Secondary | ICD-10-CM | POA: Diagnosis not present

## 2022-12-16 DIAGNOSIS — F330 Major depressive disorder, recurrent, mild: Secondary | ICD-10-CM | POA: Diagnosis not present

## 2022-12-17 DIAGNOSIS — F330 Major depressive disorder, recurrent, mild: Secondary | ICD-10-CM | POA: Diagnosis not present

## 2022-12-17 DIAGNOSIS — G35 Multiple sclerosis: Secondary | ICD-10-CM | POA: Diagnosis not present

## 2022-12-17 DIAGNOSIS — Z5982 Transportation insecurity: Secondary | ICD-10-CM | POA: Diagnosis not present

## 2022-12-17 DIAGNOSIS — F419 Anxiety disorder, unspecified: Secondary | ICD-10-CM | POA: Diagnosis not present

## 2022-12-30 ENCOUNTER — Ambulatory Visit (INDEPENDENT_AMBULATORY_CARE_PROVIDER_SITE_OTHER): Payer: Medicare HMO | Admitting: Neurology

## 2022-12-30 ENCOUNTER — Encounter: Payer: Self-pay | Admitting: Neurology

## 2022-12-30 VITALS — BP 126/69 | HR 79

## 2022-12-30 DIAGNOSIS — R269 Unspecified abnormalities of gait and mobility: Secondary | ICD-10-CM | POA: Diagnosis not present

## 2022-12-30 DIAGNOSIS — G801 Spastic diplegic cerebral palsy: Secondary | ICD-10-CM

## 2022-12-30 DIAGNOSIS — Z79899 Other long term (current) drug therapy: Secondary | ICD-10-CM

## 2022-12-30 DIAGNOSIS — E559 Vitamin D deficiency, unspecified: Secondary | ICD-10-CM | POA: Diagnosis not present

## 2022-12-30 DIAGNOSIS — G35 Multiple sclerosis: Secondary | ICD-10-CM | POA: Diagnosis not present

## 2022-12-30 MED ORDER — BACLOFEN 10 MG PO TABS: 10.0000 mg | ORAL_TABLET | Freq: Three times a day (TID) | ORAL | 4 refills | Status: DC

## 2022-12-30 MED ORDER — ERGOCALCIFEROL 1.25 MG (50000 UT) PO CAPS: 50000.0000 [IU] | ORAL_CAPSULE | ORAL | 3 refills | Status: DC

## 2022-12-30 NOTE — Progress Notes (Signed)
[facility]  PATIENT: Erin Berg DOB: Dec 12, 1965  REFERRING DOCTOR OR PCP: Dr. Standley Dakins; Dr. Elfredia Nevins SOURCE: Patient, notes from primary care, notes from Dr. Anne Hahn  _________________________________   HISTORICAL  CHIEF COMPLAINT:  Chief Complaint  Patient presents with   Room 11    Pt is here with her Aid. Pt states that her PT came to her house and never did her PT. Pt states that she wants an order for PT to come to her house so that she can get her mobility back.     HISTORY OF PRESENT ILLNESS:  Erin Berg is a 57 y.o. woman with relapsing SPMS  Update 12/30/2022: She started the Briumvi last month and had both partial doses of the load.     She tolerated it well  Currently, she has poor gait and can only do a couple steps with a walker - more pivoting than walking.  She uses a handicap chair in the shower and can get to it independently.   She takes dalfampridine with mild benefit in leg strength.  She is not at her baseline since before the last exacerbation.  She has leg spasticity, left > right.  She takes baclofen 10 mg 3 times a day.  She notes her trunk is also  weak but arms are strong though reduced RAM in left arm.    She felt numbness improved after she received steroids a few weeks ago.   She has some dysesthesias and takes gabapentin.    She has done PT with benefit and her PCP reportedly has requested 6 more weeks  She has urinary incontinence x years.  She wears Depends.  Vision is fine and she feels acuity and color saturation are symmetric.  She notes some fatigue.  She denies depression.  Sleep is variable but she sleeps well most nights.  She notes no recent changes in cognition.    MS history: She is a 57 year woman who ws diagnosed with MS in 1998.  First symptoms were in 1996 when she had trouble writing for a week or two.  In 1998, she had numbness and clumsiness on her left and she went to the ED.   She was  diagnosed with MS.   She saw Dr. Anne Hahn and was placed on Betaseron.   At some point she saw a doctor at [facility] (Dr. Leotis Shames) and srarted seeing Dr. Gerilyn Pilgrim when Dr. Leotis Shames moved away.    She switched to Tecfidera (unsure when but at least since before 2020).   She had generally done ok without relapse but her gait slowly worsened.  In early August, 2023,  she had numbness that worsened below her waist and spasms in her left leg associated with a lot of pain.   Of note, she was on Tecfidera at the time  Imaging  MRI of the brain with and without contrast 03/20/2022 showed multiple T2/FLAIR hyperintense foci with confluencies in the periatrial and deep white matter.  There is an enhancing lesion in the right temporal lobe and smaller focus in the right frontal lobe.  More than expected atrophy for age.  This study was compared to the MRI from 02/08/2020 and there are just the enhancing foci that appear to be new in that interim  MRI cervical and thoracic spine with and without contrast show T2 hyperintense foci posteriorly adjacent to C2-C3, possibly to the left adjacent to C3, anterolaterally to the left adjacent to T3, centrally adjacent  to T11.  Movement artifact is noted on the spine images.  None of the foci appear to be acute.   There are also degenerative changes with protrusion at C5-C6.  No spinal stenosis.  REVIEW OF SYSTEMS: Constitutional: No fevers, chills, sweats, or change in appetite Eyes: No visual changes, double vision, eye pain Ear, nose and throat: No hearing loss, ear pain, nasal congestion, sore throat Cardiovascular: No chest pain, palpitations Respiratory:  No shortness of breath at rest or with exertion.   No wheezes GastrointestinaI: No nausea, vomiting, diarrhea, abdominal pain, fecal incontinence Genitourinary: She has incontinence. Musculoskeletal:  No neck pain, back pain Integumentary: No rash, pruritus, skin lesions Neurological: as above Psychiatric: No  depression at this time.  No anxiety Endocrine: No palpitations, diaphoresis, change in appetite, change in weigh or increased thirst Hematologic/Lymphatic:  No anemia, purpura, petechiae. Allergic/Immunologic: No itchy/runny eyes, nasal congestion, recent allergic reactions, rashes  ALLERGIES: Allergies  Allergen Reactions   Sulfamethoxazole-Trimethoprim Hives    HOME MEDICATIONS:  Current Outpatient Medications:    acetaminophen (TYLENOL) 500 MG tablet, Take 1,000 mg by mouth every 6 (six) hours as needed for moderate pain., Disp: , Rfl:    dalfampridine 10 MG TB12, Take 1 tablet by mouth 2 (two) times daily., Disp: , Rfl:    FLUoxetine (PROZAC) 20 MG capsule, Take 20 mg by mouth daily., Disp: , Rfl:    gabapentin (NEURONTIN) 300 MG capsule, Take 300 mg by mouth 3 (three) times daily., Disp: , Rfl:    rosuvastatin (CRESTOR) 5 MG tablet, Take 5 mg by mouth at bedtime., Disp: , Rfl:    baclofen (LIORESAL) 10 MG tablet, Take 1 tablet (10 mg total) by mouth 3 (three) times daily., Disp: 270 tablet, Rfl: 4   ergocalciferol (VITAMIN D2) 1.25 MG (50000 UT) capsule, Take 1 capsule (50,000 Units total) by mouth once a week., Disp: 13 capsule, Rfl: 3  PAST MEDICAL HISTORY: Past Medical History:  Diagnosis Date   Anxiety and depression    BMI 24.0-24.9, adult    MS (multiple sclerosis) (HCC)    Multiple sclerosis (HCC)     PAST SURGICAL HISTORY: Past Surgical History:  Procedure Laterality Date   COLONOSCOPY WITH PROPOFOL N/A 12/22/2021   Procedure: COLONOSCOPY WITH PROPOFOL;  Surgeon: Corbin Ade, MD;  [facility];  Service: Endoscopy;  Laterality: N/A;  12:30 / ASA 2   TONSILLECTOMY AND ADENOIDECTOMY      FAMILY HISTORY: Family History  Problem Relation Age of Onset   Breast cancer Mother    Colon cancer Maternal Grandmother    Breast cancer Paternal Grandmother     SOCIAL HISTORY:  Social History   Socioeconomic History   Marital status: Divorced    Spouse  name: Not on file   Number of children: Not on file   Years of education: Not on file   Highest education level: Not on file  Occupational History   Not on file  Tobacco Use   Smoking status: Former    Types: Cigarettes   Smokeless tobacco: Never  Substance and Sexual Activity   Alcohol use: No    Alcohol/week: 0.0 standard drinks of alcohol   Drug use: No   Sexual activity: Not on file  Other Topics Concern   Not on file  Social History Narrative   Right Handed   1 Cup of Coffee every now and then    Social Determinants of Health   Financial Resource Strain: Not on file  Food  Insecurity: Not on file  Transportation Needs: Not on file  Physical Activity: Not on file  Stress: Not on file  Social Connections: Not on file  Intimate Partner Violence: Not on file     PHYSICAL EXAM  Vitals:   12/30/22 1527  BP: 126/69  Pulse: 79    There is no height or weight on file to calculate BMI.   General: The patient is well-developed and well-nourished and in no acute distress  HEENT:  Head is [place]/AT.  Sclera are anicteric.  Skin/Ext: Extremities are without rash or  edema.   Neurologic Exam  Mental status: The patient is alert and oriented x 3 at the time of the examination. The patient has apparent normal recent and remote memory, with an apparently normal attention span and concentration ability.   Speech is normal.  Cranial nerves: Extraocular movements are full.  Facial strength and sensation was normal.  No dysarthria.. No obvious hearing deficits are noted.  Motor:  Muscle bulk is normal.   Tone is increased in the legs, left greater than right. Strength is  5 / 5 in the right arm and 4+/5 predominantly in the left arm with 5 - grip.   Strength is 4+/5 in the right leg and 4/5 in the left iliopsoas, 4/+5 left quadriceps and 3/5 EHL/ankle extension.    Sensory: Sensory testing is intact to pinprick, soft touch and vibration sensation in all 4  extremities.  Coordination: Cerebellar testing reveals good finger-nose-finger on the right and reduced on the left.  She has difficulty with heel-to-shin.  Gait and station: She needs strong bilateral support to stand up and has difficulty raising her left leg.  She cannot tandem walk.  Romberg cannot be safely tested  Reflexes: Deep tendon reflexes are symmetric and normal bilaterally.   Plantar responses are flexor.    DIAGNOSTIC DATA (LABS, IMAGING, TESTING) - I reviewed patient records, labs, notes, testing and imaging myself where available.  Lab Results  Component Value Date   WBC 5.9 04/16/2022   HGB 14.4 04/16/2022   HCT 43.8 04/16/2022   MCV 84 04/16/2022   PLT 305 04/16/2022      Component Value Date/Time   NA 142 04/16/2022 1454   K 4.7 04/16/2022 1454   CL 100 04/16/2022 1454   CO2 25 04/16/2022 1454   GLUCOSE 111 (H) 04/16/2022 1454   GLUCOSE 154 (H) 03/21/2022 0421   BUN 20 04/16/2022 1454   CREATININE 0.56 (L) 04/16/2022 1454   CALCIUM 9.7 04/16/2022 1454   PROT 7.1 04/16/2022 1454   ALBUMIN 4.8 04/16/2022 1454   AST 21 04/16/2022 1454   ALT 31 04/16/2022 1454   ALKPHOS 81 04/16/2022 1454   BILITOT 0.4 04/16/2022 1454   GFRNONAA >60 03/21/2022 0421       ASSESSMENT AND PLAN   Multiple sclerosis exacerbation (HCC)  High risk medication use  Vitamin D deficiency  Gait disturbance  Spastic diplegia (HCC)   Continue Briumvi.  She tolerated the first course of 2 dises well and next infusion in 5 months At next visit, check IgG/IgM, Vit D and CBC/D.  Consider MRI brain and cervical spine at that time as well Continue Vit D 50000 weekly Renew baclofen and take tid.   Rtc 6 months  This visit is part of a comprehensive longitudinal care medical relationship regarding the patients primary diagnosis of multiple sclerosis and related concerns.   Ermina Oberman A. Epimenio Foot, MD, [facility] 12/30/2022, 4:03 PM Certified in Neurology, Clinical Neurophysiology,  Sleep Medicine and Neuroimaging  [facility] [address] [place], [place] [address] [phone]

## 2022-12-31 ENCOUNTER — Ambulatory Visit: Payer: Medicare HMO | Admitting: Neurology

## 2022-12-31 DIAGNOSIS — F419 Anxiety disorder, unspecified: Secondary | ICD-10-CM | POA: Diagnosis not present

## 2022-12-31 DIAGNOSIS — G35 Multiple sclerosis: Secondary | ICD-10-CM | POA: Diagnosis not present

## 2022-12-31 DIAGNOSIS — F330 Major depressive disorder, recurrent, mild: Secondary | ICD-10-CM | POA: Diagnosis not present

## 2022-12-31 DIAGNOSIS — Z5982 Transportation insecurity: Secondary | ICD-10-CM | POA: Diagnosis not present

## 2023-01-06 ENCOUNTER — Telehealth: Payer: Self-pay | Admitting: Neurology

## 2023-01-06 DIAGNOSIS — G35 Multiple sclerosis: Secondary | ICD-10-CM | POA: Diagnosis not present

## 2023-01-06 DIAGNOSIS — F330 Major depressive disorder, recurrent, mild: Secondary | ICD-10-CM | POA: Diagnosis not present

## 2023-01-06 DIAGNOSIS — Z5982 Transportation insecurity: Secondary | ICD-10-CM | POA: Diagnosis not present

## 2023-01-06 DIAGNOSIS — F419 Anxiety disorder, unspecified: Secondary | ICD-10-CM | POA: Diagnosis not present

## 2023-01-06 NOTE — Telephone Encounter (Signed)
Pt is asking for a call to discuss the headache she has had since her last infusion, please call pt to discuss.

## 2023-01-06 NOTE — Telephone Encounter (Signed)
Called pt and she stated that she had a headache that lasted 3 days. Pt states that she took Tylenol and Dalfampridine for her headache. Pt states that her aid told her the top of her head was red. Asked pt if she still had the headache,pt states that her headache is gone completely. Told pt Dr. Epimenio Foot will be notified about this manner. Pt verbalized understanding.

## 2023-01-07 NOTE — Telephone Encounter (Signed)
Called pt and let her know that Dr. Epimenio Foot said "that some people do experience headaches with the infusion.  Most of the time they last less than 1 day.  If this happens again with the next infusion she should take Tylenol as soon as it starts and 3 times a day while it is occurring" Pt verbalized understanding.

## 2023-01-16 DIAGNOSIS — G35 Multiple sclerosis: Secondary | ICD-10-CM | POA: Diagnosis not present

## 2023-01-16 DIAGNOSIS — F330 Major depressive disorder, recurrent, mild: Secondary | ICD-10-CM | POA: Diagnosis not present

## 2023-01-16 DIAGNOSIS — R5381 Other malaise: Secondary | ICD-10-CM | POA: Diagnosis not present

## 2023-01-16 DIAGNOSIS — Z9181 History of falling: Secondary | ICD-10-CM | POA: Diagnosis not present

## 2023-02-10 ENCOUNTER — Telehealth: Payer: Self-pay | Admitting: *Deleted

## 2023-02-10 NOTE — Telephone Encounter (Signed)
Called pt back. Pt agreeable for [place] to send referral to [facility] to see if insurance will cover home infusion instead.   Faxed referral to them at [phone]. Received fax confirmation.

## 2023-02-10 NOTE — Telephone Encounter (Signed)
LVM for pt to call office.  I was trying to contact because the infusion suite mentioned she has transportation issues for Briumvi infusions. Wanted to offer to refer her to home infusion if insurance will cover. The company we would refer to is [facility].

## 2023-02-10 NOTE — Telephone Encounter (Signed)
Pt returned your phone call and would like a call back.

## 2023-02-15 DIAGNOSIS — G35 Multiple sclerosis: Secondary | ICD-10-CM | POA: Diagnosis not present

## 2023-02-15 DIAGNOSIS — Z9181 History of falling: Secondary | ICD-10-CM | POA: Diagnosis not present

## 2023-02-15 DIAGNOSIS — F330 Major depressive disorder, recurrent, mild: Secondary | ICD-10-CM | POA: Diagnosis not present

## 2023-02-15 DIAGNOSIS — R5381 Other malaise: Secondary | ICD-10-CM | POA: Diagnosis not present

## 2023-02-23 DIAGNOSIS — Z6835 Body mass index (BMI) 35.0-35.9, adult: Secondary | ICD-10-CM | POA: Diagnosis not present

## 2023-02-23 DIAGNOSIS — F329 Major depressive disorder, single episode, unspecified: Secondary | ICD-10-CM | POA: Diagnosis not present

## 2023-02-23 DIAGNOSIS — G9332 Myalgic encephalomyelitis/chronic fatigue syndrome: Secondary | ICD-10-CM | POA: Diagnosis not present

## 2023-02-23 DIAGNOSIS — E559 Vitamin D deficiency, unspecified: Secondary | ICD-10-CM | POA: Diagnosis not present

## 2023-02-23 DIAGNOSIS — D518 Other vitamin B12 deficiency anemias: Secondary | ICD-10-CM | POA: Diagnosis not present

## 2023-02-23 DIAGNOSIS — Z0000 Encounter for general adult medical examination without abnormal findings: Secondary | ICD-10-CM | POA: Diagnosis not present

## 2023-02-23 DIAGNOSIS — Z1331 Encounter for screening for depression: Secondary | ICD-10-CM | POA: Diagnosis not present

## 2023-02-23 DIAGNOSIS — E6609 Other obesity due to excess calories: Secondary | ICD-10-CM | POA: Diagnosis not present

## 2023-02-23 DIAGNOSIS — G35 Multiple sclerosis: Secondary | ICD-10-CM | POA: Diagnosis not present

## 2023-03-09 ENCOUNTER — Ambulatory Visit (HOSPITAL_COMMUNITY): Payer: Medicare HMO | Attending: Internal Medicine | Admitting: Occupational Therapy

## 2023-03-09 DIAGNOSIS — Z9181 History of falling: Secondary | ICD-10-CM | POA: Diagnosis not present

## 2023-03-09 DIAGNOSIS — G35 Multiple sclerosis: Secondary | ICD-10-CM | POA: Diagnosis not present

## 2023-03-09 NOTE — Therapy (Signed)
[facility] at [facility] [address] [place], [place], [address] Phone: [phone]   Fax:  [phone]  Occupational Therapy Evaluation  Patient Details  Name: Erin Berg MRN: [id number] Date of Birth: April 27, 1966 No data recorded  Encounter Date: 03/09/2023   OT End of Session - 03/09/23 1736     Visit Number 1    Number of Visits 1    Date for OT Re-Evaluation 03/09/23    Authorization Type [employer] Medicare    OT Start Time 1432    OT Stop Time 1526    OT Time Calculation (min) 54 min    Activity Tolerance Patient tolerated treatment well    Behavior During Therapy [facility] for tasks assessed/performed             Past Medical History:  Diagnosis Date   Anxiety and depression    BMI 24.0-24.9, adult    MS (multiple sclerosis) (HCC)    Multiple sclerosis (HCC)     Past Surgical History:  Procedure Laterality Date   COLONOSCOPY WITH PROPOFOL N/A 12/22/2021   Procedure: COLONOSCOPY WITH PROPOFOL;  Surgeon: Corbin Ade, MD;  [facility];  Service: Endoscopy;  Laterality: N/A;  12:30 / ASA 2   TONSILLECTOMY AND ADENOIDECTOMY      There were no vitals filed for this visit.    Visit Diagnosis: Personal history of fall  Multiple sclerosis (HCC)    Problem List Patient Active Problem List   Diagnosis Date Noted   High risk medication use 04/16/2022   Multiple sclerosis exacerbation (HCC) 03/20/2022   Hyperlipidemia 03/20/2022   Hypokalemia 03/20/2022   Pyuria 03/20/2022   Taking multiple medications for chronic disease 10/17/2018   ALLERGIC RHINITIS 10/14/2007    Pt was seen by Outpatient OT for a Wheel Chair Evaluation. All Information and WC Assessment have been sent to [employer] for submission to insurance, as well as a copy placed in patients file. Please do not hesitate to reach out with further concerns or questions.   Trish Mage, OTR/L [facility] [phone]  [place], [place] 03/09/2023, 5:37 PM  [facility] at [facility] [address] [place], [place], [address] Phone: [phone]   Fax:  [phone]  Name: Erin Berg MRN: [id number] Date of Birth: March 18, 1966

## 2023-03-16 NOTE — Telephone Encounter (Signed)
Faxed approval Briumvi Public relations account executive) to [facility] with fax confirmation received. [phone], [phone].

## 2023-03-18 DIAGNOSIS — R5381 Other malaise: Secondary | ICD-10-CM | POA: Diagnosis not present

## 2023-03-18 DIAGNOSIS — G35 Multiple sclerosis: Secondary | ICD-10-CM | POA: Diagnosis not present

## 2023-03-18 DIAGNOSIS — Z9181 History of falling: Secondary | ICD-10-CM | POA: Diagnosis not present

## 2023-03-18 DIAGNOSIS — F330 Major depressive disorder, recurrent, mild: Secondary | ICD-10-CM | POA: Diagnosis not present

## 2023-04-18 DIAGNOSIS — Z9181 History of falling: Secondary | ICD-10-CM | POA: Diagnosis not present

## 2023-04-18 DIAGNOSIS — F330 Major depressive disorder, recurrent, mild: Secondary | ICD-10-CM | POA: Diagnosis not present

## 2023-04-18 DIAGNOSIS — R5381 Other malaise: Secondary | ICD-10-CM | POA: Diagnosis not present

## 2023-04-18 DIAGNOSIS — G35 Multiple sclerosis: Secondary | ICD-10-CM | POA: Diagnosis not present

## 2023-04-27 ENCOUNTER — Other Ambulatory Visit: Payer: Self-pay | Admitting: Internal Medicine

## 2023-04-27 DIAGNOSIS — Z1231 Encounter for screening mammogram for malignant neoplasm of breast: Secondary | ICD-10-CM

## 2023-05-03 ENCOUNTER — Other Ambulatory Visit: Payer: Self-pay

## 2023-05-03 DIAGNOSIS — G35 Multiple sclerosis: Secondary | ICD-10-CM

## 2023-05-11 DIAGNOSIS — G35 Multiple sclerosis: Secondary | ICD-10-CM | POA: Diagnosis not present

## 2023-05-11 DIAGNOSIS — R609 Edema, unspecified: Secondary | ICD-10-CM | POA: Diagnosis not present

## 2023-05-11 DIAGNOSIS — G2582 Stiff-man syndrome: Secondary | ICD-10-CM | POA: Diagnosis not present

## 2023-05-11 DIAGNOSIS — R5381 Other malaise: Secondary | ICD-10-CM | POA: Diagnosis not present

## 2023-05-11 DIAGNOSIS — Z9181 History of falling: Secondary | ICD-10-CM | POA: Diagnosis not present

## 2023-05-12 NOTE — Telephone Encounter (Signed)
I called and spoke to [facility] with [facility].  Pt to start 05/30/2022.  She has been hard to reach pt.

## 2023-05-14 ENCOUNTER — Ambulatory Visit: Payer: Medicare HMO

## 2023-05-18 DIAGNOSIS — R5381 Other malaise: Secondary | ICD-10-CM | POA: Diagnosis not present

## 2023-05-18 DIAGNOSIS — G35 Multiple sclerosis: Secondary | ICD-10-CM | POA: Diagnosis not present

## 2023-05-18 DIAGNOSIS — Z9181 History of falling: Secondary | ICD-10-CM | POA: Diagnosis not present

## 2023-05-18 DIAGNOSIS — F330 Major depressive disorder, recurrent, mild: Secondary | ICD-10-CM | POA: Diagnosis not present

## 2023-05-31 DIAGNOSIS — G35 Multiple sclerosis: Secondary | ICD-10-CM | POA: Diagnosis not present

## 2023-06-03 DIAGNOSIS — F329 Major depressive disorder, single episode, unspecified: Secondary | ICD-10-CM | POA: Diagnosis not present

## 2023-06-03 DIAGNOSIS — R5381 Other malaise: Secondary | ICD-10-CM | POA: Diagnosis not present

## 2023-06-03 DIAGNOSIS — E6609 Other obesity due to excess calories: Secondary | ICD-10-CM | POA: Diagnosis not present

## 2023-06-03 DIAGNOSIS — G35 Multiple sclerosis: Secondary | ICD-10-CM | POA: Diagnosis not present

## 2023-06-03 DIAGNOSIS — R7309 Other abnormal glucose: Secondary | ICD-10-CM | POA: Diagnosis not present

## 2023-06-03 DIAGNOSIS — Z6838 Body mass index (BMI) 38.0-38.9, adult: Secondary | ICD-10-CM | POA: Diagnosis not present

## 2023-06-17 DIAGNOSIS — E785 Hyperlipidemia, unspecified: Secondary | ICD-10-CM | POA: Diagnosis not present

## 2023-06-17 DIAGNOSIS — G35 Multiple sclerosis: Secondary | ICD-10-CM | POA: Diagnosis not present

## 2023-06-18 DIAGNOSIS — F330 Major depressive disorder, recurrent, mild: Secondary | ICD-10-CM | POA: Diagnosis not present

## 2023-06-18 DIAGNOSIS — Z9181 History of falling: Secondary | ICD-10-CM | POA: Diagnosis not present

## 2023-06-18 DIAGNOSIS — R5381 Other malaise: Secondary | ICD-10-CM | POA: Diagnosis not present

## 2023-06-18 DIAGNOSIS — G35 Multiple sclerosis: Secondary | ICD-10-CM | POA: Diagnosis not present

## 2023-07-07 NOTE — Progress Notes (Deleted)
No chief complaint on file.   HISTORY OF PRESENT ILLNESS:  07/07/23 ALL:  Erin Berg is a 57 y.o. female here today for follow up for SPMS. She continues Briumvi, loading doses administered in 12/2022.    Baclofen  Gabapentin  Dalfampridine   Mood is stable on fluoxetine.   HISTORY (copied from Dr Bonnita Hollow previous note)  Erin Berg is a 57 y.o. woman with relapsing SPMS   Update 12/30/2022: She started the Briumvi last month and had both partial doses of the load.     She tolerated it well   Currently, she has poor gait and can only do a couple steps with a walker - more pivoting than walking.  She uses a handicap chair in the shower and can get to it independently.   She takes dalfampridine with mild benefit in leg strength.  She is not at her baseline since before the last exacerbation.  She has leg spasticity, left > right.  She takes baclofen 10 mg 3 times a day.  She notes her trunk is also  weak but arms are strong though reduced RAM in left arm.    She felt numbness improved after she received steroids a few weeks ago.   She has some dysesthesias and takes gabapentin.     She has done PT with benefit and her PCP reportedly has requested 6 more weeks   She has urinary incontinence x years.  She wears Depends.  Vision is fine and she feels acuity and color saturation are symmetric.   She notes some fatigue.  She denies depression.  Sleep is variable but she sleeps well most nights.  She notes no recent changes in cognition.   MS history: She is a 32 year woman who ws diagnosed with MS in 1998.  First symptoms were in 1996 when she had trouble writing for a week or two.  In 1998, she had numbness and clumsiness on her left and she went to the ED.   She was diagnosed with MS.   She saw Dr. Anne Hahn and was placed on Betaseron.   At some point she saw a doctor at [facility] (Dr. Leotis Shames) and srarted seeing Dr. Gerilyn Pilgrim when Dr. Leotis Shames moved away.    She switched to  Tecfidera (unsure when but at least since before 2020).   She had generally done ok without relapse but her gait slowly worsened.   In early August, 2023,  she had numbness that worsened below her waist and spasms in her left leg associated with a lot of pain.   Of note, she was on Tecfidera at the time   Imaging  MRI of the brain with and without contrast 03/20/2022 showed multiple T2/FLAIR hyperintense foci with confluencies in the periatrial and deep white matter.  There is an enhancing lesion in the right temporal lobe and smaller focus in the right frontal lobe.  More than expected atrophy for age.  This study was compared to the MRI from 02/08/2020 and there are just the enhancing foci that appear to be new in that interim   MRI cervical and thoracic spine with and without contrast show T2 hyperintense foci posteriorly adjacent to C2-C3, possibly to the left adjacent to C3, anterolaterally to the left adjacent to T3, centrally adjacent to T11.  Movement artifact is noted on the spine images.  None of the foci appear to be acute.   There are also degenerative changes with protrusion at C5-C6.  No  spinal stenosis.   REVIEW OF SYSTEMS: Out of a complete 14 system review of symptoms, the patient complains only of the following symptoms, and all other reviewed systems are negative.   ALLERGIES: Allergies  Allergen Reactions   Sulfamethoxazole-Trimethoprim Hives     HOME MEDICATIONS: Outpatient Medications Prior to Visit  Medication Sig Dispense Refill   acetaminophen (TYLENOL) 500 MG tablet Take 1,000 mg by mouth every 6 (six) hours as needed for moderate pain.     baclofen (LIORESAL) 10 MG tablet Take 1 tablet (10 mg total) by mouth 3 (three) times daily. 270 tablet 4   dalfampridine 10 MG TB12 Take 1 tablet by mouth 2 (two) times daily.     ergocalciferol (VITAMIN D2) 1.25 MG (50000 UT) capsule Take 1 capsule (50,000 Units total) by mouth once a week. 13 capsule 3   FLUoxetine (PROZAC) 20  MG capsule Take 20 mg by mouth daily.     gabapentin (NEURONTIN) 300 MG capsule Take 300 mg by mouth 3 (three) times daily.     rosuvastatin (CRESTOR) 5 MG tablet Take 5 mg by mouth at bedtime.     No facility-administered medications prior to visit.     PAST MEDICAL HISTORY: Past Medical History:  Diagnosis Date   Anxiety and depression    BMI 24.0-24.9, adult    MS (multiple sclerosis) (HCC)    Multiple sclerosis (HCC)      PAST SURGICAL HISTORY: Past Surgical History:  Procedure Laterality Date   COLONOSCOPY WITH PROPOFOL N/A 12/22/2021   Procedure: COLONOSCOPY WITH PROPOFOL;  Surgeon: Corbin Ade, MD;  [facility];  Service: Endoscopy;  Laterality: N/A;  12:30 / ASA 2   TONSILLECTOMY AND ADENOIDECTOMY       FAMILY HISTORY: Family History  Problem Relation Age of Onset   Breast cancer Mother    Colon cancer Maternal Grandmother    Breast cancer Paternal Grandmother      SOCIAL HISTORY: Social History   Socioeconomic History   Marital status: Divorced    Spouse name: Not on file   Number of children: Not on file   Years of education: Not on file   Highest education level: Not on file  Occupational History   Not on file  Tobacco Use   Smoking status: Former    Types: Cigarettes   Smokeless tobacco: Never  Substance and Sexual Activity   Alcohol use: No    Alcohol/week: 0.0 standard drinks of alcohol   Drug use: No   Sexual activity: Not on file  Other Topics Concern   Not on file  Social History Narrative   Right Handed   1 Cup of Coffee every now and then    Social Determinants of Health   Financial Resource Strain: Not on file  Food Insecurity: Not on file  Transportation Needs: Not on file  Physical Activity: Not on file  Stress: Not on file  Social Connections: Not on file  Intimate Partner Violence: Not on file     PHYSICAL EXAM  There were no vitals filed for this visit. There is no height or weight on file to calculate  BMI.  Generalized: Well developed, in no acute distress  Cardiology: normal rate and rhythm, no murmur auscultated  Respiratory: clear to auscultation bilaterally    Neurological examination  Mentation: Alert oriented to time, place, history taking. Follows all commands speech and language fluent Cranial nerve II-XII: Pupils were equal round reactive to light. Extraocular movements were full, visual  field were full on confrontational test. Facial sensation and strength were normal. Uvula tongue midline. Head turning and shoulder shrug  were normal and symmetric. Motor: The motor testing reveals 5 over 5 strength of all 4 extremities. Good symmetric motor tone is noted throughout.  Sensory: Sensory testing is intact to soft touch on all 4 extremities. No evidence of extinction is noted.  Coordination: Cerebellar testing reveals good finger-nose-finger and heel-to-shin bilaterally.  Gait and station: Gait is normal. Tandem gait is normal. Romberg is negative. No drift is seen.  Reflexes: Deep tendon reflexes are symmetric and normal bilaterally.    DIAGNOSTIC DATA (LABS, IMAGING, TESTING) - I reviewed patient records, labs, notes, testing and imaging myself where available.  Lab Results  Component Value Date   WBC 5.9 04/16/2022   HGB 14.4 04/16/2022   HCT 43.8 04/16/2022   MCV 84 04/16/2022   PLT 305 04/16/2022      Component Value Date/Time   NA 142 04/16/2022 1454   K 4.7 04/16/2022 1454   CL 100 04/16/2022 1454   CO2 25 04/16/2022 1454   GLUCOSE 111 (H) 04/16/2022 1454   GLUCOSE 154 (H) 03/21/2022 0421   BUN 20 04/16/2022 1454   CREATININE 0.56 (L) 04/16/2022 1454   CALCIUM 9.7 04/16/2022 1454   PROT 7.1 04/16/2022 1454   ALBUMIN 4.8 04/16/2022 1454   AST 21 04/16/2022 1454   ALT 31 04/16/2022 1454   ALKPHOS 81 04/16/2022 1454   BILITOT 0.4 04/16/2022 1454   GFRNONAA >60 03/21/2022 0421   No results found for: "CHOL", "HDL", "LDLCALC", "LDLDIRECT", "TRIG",  "CHOLHDL" No results found for: "HGBA1C" No results found for: "VITAMINB12" No results found for: "TSH"      No data to display               No data to display           ASSESSMENT AND PLAN  57 y.o. year old female  has a past medical history of Anxiety and depression, BMI 24.0-24.9, adult, MS (multiple sclerosis) (HCC), and Multiple sclerosis (HCC). here with    No diagnosis found.  [place] ***.  Healthy lifestyle habits encouraged. *** will follow up with PCP as directed. *** will return to see me in ***, sooner if needed. *** verbalizes understanding and agreement with this plan.   No orders of the defined types were placed in this encounter.    No orders of the defined types were placed in this encounter.    Shawnie Dapper, MSN, FNP-C 07/07/2023, 7:59 AM  [facility] [address] [place], [place] [address] [phone]

## 2023-07-13 ENCOUNTER — Ambulatory Visit: Payer: Medicare HMO | Admitting: Family Medicine

## 2023-07-17 DIAGNOSIS — F419 Anxiety disorder, unspecified: Secondary | ICD-10-CM | POA: Diagnosis not present

## 2023-07-17 DIAGNOSIS — E785 Hyperlipidemia, unspecified: Secondary | ICD-10-CM | POA: Diagnosis not present

## 2023-07-18 DIAGNOSIS — G35 Multiple sclerosis: Secondary | ICD-10-CM | POA: Diagnosis not present

## 2023-07-18 DIAGNOSIS — F330 Major depressive disorder, recurrent, mild: Secondary | ICD-10-CM | POA: Diagnosis not present

## 2023-07-18 DIAGNOSIS — Z9181 History of falling: Secondary | ICD-10-CM | POA: Diagnosis not present

## 2023-07-18 DIAGNOSIS — R5381 Other malaise: Secondary | ICD-10-CM | POA: Diagnosis not present

## 2023-08-13 ENCOUNTER — Ambulatory Visit
Admission: RE | Admit: 2023-08-13 | Discharge: 2023-08-13 | Disposition: A | Discharge status: Home / Self Care | Payer: Medicare HMO | Source: Ambulatory Visit | Attending: Internal Medicine | Admitting: Internal Medicine

## 2023-08-13 DIAGNOSIS — Z1231 Encounter for screening mammogram for malignant neoplasm of breast: Secondary | ICD-10-CM

## 2023-08-17 DIAGNOSIS — G35 Multiple sclerosis: Secondary | ICD-10-CM | POA: Diagnosis not present

## 2023-08-17 DIAGNOSIS — R5381 Other malaise: Secondary | ICD-10-CM | POA: Diagnosis not present

## 2023-08-17 DIAGNOSIS — F419 Anxiety disorder, unspecified: Secondary | ICD-10-CM | POA: Diagnosis not present

## 2023-08-18 DIAGNOSIS — R5381 Other malaise: Secondary | ICD-10-CM | POA: Diagnosis not present

## 2023-08-18 DIAGNOSIS — F330 Major depressive disorder, recurrent, mild: Secondary | ICD-10-CM | POA: Diagnosis not present

## 2023-08-18 DIAGNOSIS — G35 Multiple sclerosis: Secondary | ICD-10-CM | POA: Diagnosis not present

## 2023-08-18 DIAGNOSIS — Z9181 History of falling: Secondary | ICD-10-CM | POA: Diagnosis not present

## 2023-09-18 DIAGNOSIS — R5381 Other malaise: Secondary | ICD-10-CM | POA: Diagnosis not present

## 2023-09-18 DIAGNOSIS — G35 Multiple sclerosis: Secondary | ICD-10-CM | POA: Diagnosis not present

## 2023-09-18 DIAGNOSIS — F330 Major depressive disorder, recurrent, mild: Secondary | ICD-10-CM | POA: Diagnosis not present

## 2023-09-18 DIAGNOSIS — Z9181 History of falling: Secondary | ICD-10-CM | POA: Diagnosis not present

## 2023-09-23 DIAGNOSIS — E6609 Other obesity due to excess calories: Secondary | ICD-10-CM | POA: Diagnosis not present

## 2023-09-23 DIAGNOSIS — G35 Multiple sclerosis: Secondary | ICD-10-CM | POA: Diagnosis not present

## 2023-09-23 DIAGNOSIS — Z6835 Body mass index (BMI) 35.0-35.9, adult: Secondary | ICD-10-CM | POA: Diagnosis not present

## 2023-09-23 DIAGNOSIS — F330 Major depressive disorder, recurrent, mild: Secondary | ICD-10-CM | POA: Diagnosis not present

## 2023-10-14 NOTE — Patient Instructions (Signed)
 Below is our plan:  We will continue baclofen 10mg  three times daily and gabapentin per PCP direction. I will ask Dr Epimenio Foot about dalfampridine. We will update labs, today. I will place orders for repeat imaging of your brain and cervical spine.   Please make sure you are staying well hydrated. I recommend 50-60 ounces daily. Well balanced diet and regular exercise encouraged. Consistent sleep schedule with 6-8 hours recommended.   Please continue follow up with care team as directed.   Follow up with Dr Epimenio Foot in 6 months   You may receive a survey regarding today's visit. I encourage you to leave honest feed back as I do use this information to improve patient care. Thank you for seeing me today!

## 2023-10-14 NOTE — Progress Notes (Signed)
 Chief Complaint  Patient presents with   Multiple Sclerosis    Rm1, caregiver cna from [facility] present,  Multiple sclerosis exacerbation: Gait disturbance:uses wheelchair fulltime,  Spastic diplegia:pt stated doesn't feel like she's gotten any worse since last visit.     HISTORY OF PRESENT ILLNESS:  10/20/23 ALL:  Erin Berg is a 58 y.o. female here today for follow up for SPMS. She continues Briumvi infusions. MRI brain 03/2022 showed an enhancing lesion in the right temporal lobe and smaller focus in the right frontal lobe.  More than expected atrophy for age. Cervical imaging did not show any acute lesions. Briumvi started 11/2022. She seems to tolerate it well. Has home infusions. Labs have been stable.   She reports symptoms are stable. No new or worsening symptoms. She does not walk much. She is able to transfer from wheelchair to toilet. Has walker for transfers. Has power chair at home but is currently broken. She has appt for repairs in April. HH with [facility]. She Stopped dalfampridine last year after starting Briumvi. She is not sure she has noticed any change in symptoms.   Gabapentin 300mg  TID currently prescribed by PCP helps with dysesthesias. Mood stable on fluoxetine.   She continues to have urinary urgency and incontinence. Wears depends.    Vitamin D rx continued. She denies changes in vision.   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     She tolerated it well   Currently, she has poor gait and can only do a couple steps with a walker - more pivoting than walking.  She uses a handicap chair in the shower and can get to it independently.   She takes dalfampridine with mild benefit in leg strength.  She is not at her baseline since before the last exacerbation.  She has leg spasticity, left > right.  She takes  baclofen 10 mg 3 times a day.  She notes her trunk is also  weak but arms are strong though reduced RAM in left arm.    She felt numbness improved after she received steroids a few weeks ago.   She has some dysesthesias and takes gabapentin.     She has done PT with benefit and her PCP reportedly has requested 6 more weeks   She has urinary incontinence x years.  She wears Depends.  Vision is fine and she feels acuity and color saturation are symmetric.   She notes some fatigue.  She denies depression.  Sleep is variable but she sleeps well most nights.  She notes no recent changes in cognition.     MS history: She is a 29 year woman who ws diagnosed with MS in 1998.  First symptoms were in 1996 when she had trouble writing for a week or two.  In 1998, she had numbness and clumsiness on her left and she went to the ED.   She was diagnosed with MS.   She saw Dr. Anne Hahn and was placed on Betaseron.   At some point she saw a doctor at [facility] (Dr. Leotis Shames) and srarted seeing Dr. Gerilyn Pilgrim when Dr. Leotis Shames moved away.    She switched to Tecfidera (unsure when but at least since before 2020).   She had generally done ok without relapse but her gait slowly worsened.   In early  August, 2023,  she had numbness that worsened below her waist and spasms in her left leg associated with a lot of pain.   Of note, she was on Tecfidera at the time   Imaging  MRI of the brain with and without contrast 03/20/2022 showed multiple T2/FLAIR hyperintense foci with confluencies in the periatrial and deep white matter.  There is an enhancing lesion in the right temporal lobe and smaller focus in the right frontal lobe.  More than expected atrophy for age.  This study was compared to the MRI from 02/08/2020 and there are just the enhancing foci that appear to be new in that interim   MRI cervical and thoracic spine with and without contrast show T2 hyperintense foci posteriorly adjacent to C2-C3, possibly to the left  adjacent to C3, anterolaterally to the left adjacent to T3, centrally adjacent to T11.  Movement artifact is noted on the spine images.  None of the foci appear to be acute.   There are also degenerative changes with protrusion at C5-C6.  No spinal stenosis.   REVIEW OF SYSTEMS: Out of a complete 14 system review of symptoms, the patient complains only of the following symptoms, weakness lower ext, gait difficulty, depression, pain, and all other reviewed systems are negative.   ALLERGIES: Allergies  Allergen Reactions   Sulfamethoxazole-Trimethoprim Hives     HOME MEDICATIONS: Outpatient Medications Prior to Visit  Medication Sig Dispense Refill   acetaminophen (TYLENOL) 500 MG tablet Take 1,000 mg by mouth every 6 (six) hours as needed for moderate pain.     cetirizine (ZYRTEC) 10 MG tablet Take 10 mg by mouth daily.     dalfampridine 10 MG TB12 Take 1 tablet by mouth 2 (two) times daily.     ergocalciferol (VITAMIN D2) 1.25 MG (50000 UT) capsule Take 1 capsule (50,000 Units total) by mouth once a week. 13 capsule 3   FLUoxetine (PROZAC) 20 MG capsule Take 20 mg by mouth daily.     gabapentin (NEURONTIN) 300 MG capsule Take 300 mg by mouth 3 (three) times daily.     Magnesium 200 MG TABS Take 1 tablet by mouth daily.     rosuvastatin (CRESTOR) 5 MG tablet Take 5 mg by mouth at bedtime.     baclofen (LIORESAL) 10 MG tablet Take 1 tablet (10 mg total) by mouth 3 (three) times daily. 270 tablet 4   No facility-administered medications prior to visit.     PAST MEDICAL HISTORY: Past Medical History:  Diagnosis Date   Anxiety and depression    BMI 24.0-24.9, adult    MS (multiple sclerosis) (HCC)    Multiple sclerosis (HCC)      PAST SURGICAL HISTORY: Past Surgical History:  Procedure Laterality Date   COLONOSCOPY WITH PROPOFOL N/A 12/22/2021   Procedure: COLONOSCOPY WITH PROPOFOL;  Surgeon: Corbin Ade, MD;  Location: [facility];  Service: Endoscopy;  Laterality: N/A;   12:30 / ASA 2   TONSILLECTOMY AND ADENOIDECTOMY       FAMILY HISTORY: Family History  Problem Relation Age of Onset   Breast cancer Mother    Colon cancer Maternal Grandmother    Breast cancer Paternal Grandmother      SOCIAL HISTORY: Social History   Socioeconomic History   Marital status: Divorced    Spouse name: Not on file   Number of children: Not on file   Years of education: Not on file   Highest education level: Not on file  Occupational History  Not on file  Tobacco Use   Smoking status: Former    Types: Cigarettes   Smokeless tobacco: Never  Substance and Sexual Activity   Alcohol use: No    Alcohol/week: 0.0 standard drinks of alcohol   Drug use: No   Sexual activity: Not on file  Other Topics Concern   Not on file  Social History Narrative   Right Handed   1 Cup of Coffee every now and then    Social Drivers of Health   Financial Resource Strain: Not on file  Food Insecurity: Not on file  Transportation Needs: Not on file  Physical Activity: Not on file  Stress: Not on file  Social Connections: Not on file  Intimate Partner Violence: Not on file     PHYSICAL EXAM  Vitals:   10/20/23 1426  BP: 117/75  Pulse: 81  Resp: 15  Height: 5\' 5"  (1.651 m)   Body mass index is 35.94 kg/m.  Generalized: Well developed, in no acute distress  Cardiology: normal rate and rhythm, no murmur auscultated  Respiratory: clear to auscultation bilaterally    Neurological examination  Mentation: Alert oriented to time, place, history taking. Follows all commands speech and language fluent Cranial nerve II-XII: Pupils were equal round reactive to light. Extraocular movements were full, visual field were full on confrontational test. Facial sensation and strength were normal. Uvula tongue midline. Head turning and shoulder shrug  were normal and symmetric. Motor: The motor testing reveals 5 over 5 strength of bilateral upper ext, 4+/5 in the right leg and 4/5  in the left iliopsoas, 4/+5 left quadriceps and 3/5 EHL/ankle extension. Tone increased in lower ext.  Sensory: Sensory testing is intact to soft touch on all 4 extremities. No evidence of extinction is noted.  Coordination: Cerebellar testing reveals good finger-nose-finger, unable to perform heel-to-shin bilaterally.  Gait and station: in wheelchair today with no assistive device.  Reflexes: Deep tendon reflexes are brisk but symmetric    DIAGNOSTIC DATA (LABS, IMAGING, TESTING) - I reviewed patient records, labs, notes, testing and imaging myself where available.  Lab Results  Component Value Date   WBC 5.9 04/16/2022   HGB 14.4 04/16/2022   HCT 43.8 04/16/2022   MCV 84 04/16/2022   PLT 305 04/16/2022      Component Value Date/Time   NA 142 04/16/2022 1454   K 4.7 04/16/2022 1454   CL 100 04/16/2022 1454   CO2 25 04/16/2022 1454   GLUCOSE 111 (H) 04/16/2022 1454   GLUCOSE 154 (H) 03/21/2022 0421   BUN 20 04/16/2022 1454   CREATININE 0.56 (L) 04/16/2022 1454   CALCIUM 9.7 04/16/2022 1454   PROT 7.1 04/16/2022 1454   ALBUMIN 4.8 04/16/2022 1454   AST 21 04/16/2022 1454   ALT 31 04/16/2022 1454   ALKPHOS 81 04/16/2022 1454   BILITOT 0.4 04/16/2022 1454   GFRNONAA >60 03/21/2022 0421   No results found for: "CHOL", "HDL", "LDLCALC", "LDLDIRECT", "TRIG", "CHOLHDL" No results found for: "HGBA1C" No results found for: "VITAMINB12" No results found for: "TSH"      No data to display               No data to display           ASSESSMENT AND PLAN  58 y.o. year old female  has a past medical history of Anxiety and depression, BMI 24.0-24.9, adult, MS (multiple sclerosis) (HCC), and Multiple sclerosis (HCC). here with    Multiple sclerosis exacerbation (HCC) -  Plan: IgG, IgA, IgM, CBC with Differential/Platelets, MR BRAIN W WO CONTRAST, MR CERVICAL SPINE W WO CONTRAST  Spastic diplegia (HCC)  High risk medication use  Vitamin D deficiency - Plan: Vitamin D,  25-hydroxy  Urinary incontinence, unspecified type  Erin Berg reports symptoms are stable. We will continue Briumvi infusions in her home. I will update labs, today, and order updated imaging for monitoring. She will continue baclofen and gabapentin as prescribed. She requests to restart dalfampridine but unclear what symptoms she feels it helped with. Will discuss with Dr Epimenio Foot. Healthy lifestyle habits encouraged. She will follow up with PCP as directed. She will return to see Dr Epimenio Foot in 6 months, sooner if needed. She verbalizes understanding and agreement with this plan.   Orders Placed This Encounter  Procedures   MR BRAIN W WO CONTRAST    Standing Status:   Future    Expiration Date:   10/19/2024    If indicated for the ordered procedure, I authorize the administration of contrast media per Radiology protocol:   Yes    What is the patient's sedation requirement?:   No Sedation    Does the patient have a pacemaker or implanted devices?:   No    Preferred imaging location?:   External   MR CERVICAL SPINE W WO CONTRAST    Standing Status:   Future    Expiration Date:   10/19/2024    If indicated for the ordered procedure, I authorize the administration of contrast media per Radiology protocol:   Yes    What is the patient's sedation requirement?:   No Sedation    Does the patient have a pacemaker or implanted devices?:   No    Preferred imaging location?:   External   IgG, IgA, IgM   CBC with Differential/Platelets   Vitamin D, 25-hydroxy     Meds ordered this encounter  Medications   baclofen (LIORESAL) 10 MG tablet    Sig: Take 1 tablet (10 mg total) by mouth 3 (three) times daily.    Dispense:  270 tablet    Refill:  4    Supervising Provider:   Anson Fret [[id number]]     Shawnie Dapper, MSN, FNP-C 10/20/2023, 3:18 PM  [facility] [address], [address] [place], [place] [address] [phone]

## 2023-10-19 ENCOUNTER — Telehealth: Payer: Self-pay | Admitting: Neurology

## 2023-10-19 NOTE — Telephone Encounter (Signed)
 Pt called to verify appointment

## 2023-10-20 ENCOUNTER — Ambulatory Visit (INDEPENDENT_AMBULATORY_CARE_PROVIDER_SITE_OTHER): Payer: Medicare HMO | Admitting: Family Medicine

## 2023-10-20 ENCOUNTER — Encounter: Payer: Self-pay | Admitting: Family Medicine

## 2023-10-20 VITALS — BP 117/75 | HR 81 | Resp 15 | Ht 65.0 in

## 2023-10-20 DIAGNOSIS — G801 Spastic diplegic cerebral palsy: Secondary | ICD-10-CM | POA: Diagnosis not present

## 2023-10-20 DIAGNOSIS — Z79899 Other long term (current) drug therapy: Secondary | ICD-10-CM | POA: Diagnosis not present

## 2023-10-20 DIAGNOSIS — E559 Vitamin D deficiency, unspecified: Secondary | ICD-10-CM

## 2023-10-20 DIAGNOSIS — R32 Unspecified urinary incontinence: Secondary | ICD-10-CM | POA: Diagnosis not present

## 2023-10-20 DIAGNOSIS — G35 Multiple sclerosis: Secondary | ICD-10-CM

## 2023-10-20 MED ORDER — BACLOFEN 10 MG PO TABS: 10.0000 mg | ORAL_TABLET | Freq: Three times a day (TID) | ORAL | 4 refills | Status: AC

## 2023-10-21 ENCOUNTER — Telehealth: Payer: Self-pay

## 2023-10-21 LAB — CBC WITH DIFFERENTIAL/PLATELET
Basophils Absolute: 0.1 10*3/uL (ref 0.0–0.2)
Basos: 1 %
EOS (ABSOLUTE): 0.1 10*3/uL (ref 0.0–0.4)
Eos: 1 %
Hematocrit: 44.4 % (ref 34.0–46.6)
Hemoglobin: 13.9 g/dL (ref 11.1–15.9)
Immature Grans (Abs): 0 10*3/uL (ref 0.0–0.1)
Immature Granulocytes: 0 %
Lymphocytes Absolute: 1.2 10*3/uL (ref 0.7–3.1)
Lymphs: 14 %
MCH: 26.2 pg — ABNORMAL LOW (ref 26.6–33.0)
MCHC: 31.3 g/dL — ABNORMAL LOW (ref 31.5–35.7)
MCV: 84 fL (ref 79–97)
Monocytes Absolute: 0.7 10*3/uL (ref 0.1–0.9)
Monocytes: 8 %
Neutrophils Absolute: 6.7 10*3/uL (ref 1.4–7.0)
Neutrophils: 76 %
Platelets: 270 10*3/uL (ref 150–450)
RBC: 5.31 x10E6/uL — ABNORMAL HIGH (ref 3.77–5.28)
RDW: 14.3 % (ref 11.7–15.4)
WBC: 8.8 10*3/uL (ref 3.4–10.8)

## 2023-10-21 LAB — IGG, IGA, IGM
IgA/Immunoglobulin A, Serum: 207 mg/dL (ref 87–352)
IgG (Immunoglobin G), Serum: 858 mg/dL (ref 586–1602)
IgM (Immunoglobulin M), Srm: 86 mg/dL (ref 26–217)

## 2023-10-21 LAB — VITAMIN D 25 HYDROXY (VIT D DEFICIENCY, FRACTURES): Vit D, 25-Hydroxy: 68.8 ng/mL (ref 30.0–100.0)

## 2023-10-21 NOTE — Telephone Encounter (Signed)
-----   Message from Asa Lente sent at 10/21/2023 10:17 AM EST ----- Please let her know that the blood work looked fine.  The vitamin D level was also in the normal range.

## 2023-10-22 DIAGNOSIS — E669 Obesity, unspecified: Secondary | ICD-10-CM | POA: Diagnosis not present

## 2023-10-22 DIAGNOSIS — M6281 Muscle weakness (generalized): Secondary | ICD-10-CM | POA: Diagnosis not present

## 2023-10-22 DIAGNOSIS — G35 Multiple sclerosis: Secondary | ICD-10-CM | POA: Diagnosis not present

## 2023-10-22 DIAGNOSIS — F330 Major depressive disorder, recurrent, mild: Secondary | ICD-10-CM | POA: Diagnosis not present

## 2023-10-22 DIAGNOSIS — Z9181 History of falling: Secondary | ICD-10-CM | POA: Diagnosis not present

## 2023-10-22 DIAGNOSIS — F419 Anxiety disorder, unspecified: Secondary | ICD-10-CM | POA: Diagnosis not present

## 2023-10-22 DIAGNOSIS — Z6835 Body mass index (BMI) 35.0-35.9, adult: Secondary | ICD-10-CM | POA: Diagnosis not present

## 2023-10-22 DIAGNOSIS — R278 Other lack of coordination: Secondary | ICD-10-CM | POA: Diagnosis not present

## 2023-10-29 DIAGNOSIS — F419 Anxiety disorder, unspecified: Secondary | ICD-10-CM | POA: Diagnosis not present

## 2023-10-29 DIAGNOSIS — Z6835 Body mass index (BMI) 35.0-35.9, adult: Secondary | ICD-10-CM | POA: Diagnosis not present

## 2023-10-29 DIAGNOSIS — G35 Multiple sclerosis: Secondary | ICD-10-CM | POA: Diagnosis not present

## 2023-10-29 DIAGNOSIS — F330 Major depressive disorder, recurrent, mild: Secondary | ICD-10-CM | POA: Diagnosis not present

## 2023-10-29 DIAGNOSIS — R278 Other lack of coordination: Secondary | ICD-10-CM | POA: Diagnosis not present

## 2023-10-29 DIAGNOSIS — Z9181 History of falling: Secondary | ICD-10-CM | POA: Diagnosis not present

## 2023-10-29 DIAGNOSIS — M6281 Muscle weakness (generalized): Secondary | ICD-10-CM | POA: Diagnosis not present

## 2023-10-29 DIAGNOSIS — E669 Obesity, unspecified: Secondary | ICD-10-CM | POA: Diagnosis not present

## 2023-11-01 ENCOUNTER — Telehealth: Payer: Self-pay | Admitting: Neurology

## 2023-11-01 ENCOUNTER — Telehealth: Payer: Self-pay | Admitting: Family Medicine

## 2023-11-01 DIAGNOSIS — G35 Multiple sclerosis: Secondary | ICD-10-CM | POA: Diagnosis not present

## 2023-11-01 DIAGNOSIS — F419 Anxiety disorder, unspecified: Secondary | ICD-10-CM | POA: Diagnosis not present

## 2023-11-01 DIAGNOSIS — Z6835 Body mass index (BMI) 35.0-35.9, adult: Secondary | ICD-10-CM | POA: Diagnosis not present

## 2023-11-01 DIAGNOSIS — F330 Major depressive disorder, recurrent, mild: Secondary | ICD-10-CM | POA: Diagnosis not present

## 2023-11-01 DIAGNOSIS — Z9181 History of falling: Secondary | ICD-10-CM | POA: Diagnosis not present

## 2023-11-01 DIAGNOSIS — R278 Other lack of coordination: Secondary | ICD-10-CM | POA: Diagnosis not present

## 2023-11-01 DIAGNOSIS — M6281 Muscle weakness (generalized): Secondary | ICD-10-CM | POA: Diagnosis not present

## 2023-11-01 DIAGNOSIS — E669 Obesity, unspecified: Secondary | ICD-10-CM | POA: Diagnosis not present

## 2023-11-01 NOTE — Telephone Encounter (Signed)
 Magnesium was already on the chart and added the fish oil as requested

## 2023-11-01 NOTE — Telephone Encounter (Signed)
 Pt asked her RN be informed that she is taking 1400mg  fish oil and 200 mg of magnesium daily.

## 2023-11-01 NOTE — Telephone Encounter (Signed)
 Pt is asking to be called to discuss her request of her MRI being at [facility] because it is closer to where she lives.

## 2023-11-02 DIAGNOSIS — Z9181 History of falling: Secondary | ICD-10-CM | POA: Diagnosis not present

## 2023-11-02 DIAGNOSIS — E669 Obesity, unspecified: Secondary | ICD-10-CM | POA: Diagnosis not present

## 2023-11-02 DIAGNOSIS — M6281 Muscle weakness (generalized): Secondary | ICD-10-CM | POA: Diagnosis not present

## 2023-11-02 DIAGNOSIS — F330 Major depressive disorder, recurrent, mild: Secondary | ICD-10-CM | POA: Diagnosis not present

## 2023-11-02 DIAGNOSIS — G35 Multiple sclerosis: Secondary | ICD-10-CM | POA: Diagnosis not present

## 2023-11-02 DIAGNOSIS — F419 Anxiety disorder, unspecified: Secondary | ICD-10-CM | POA: Diagnosis not present

## 2023-11-02 DIAGNOSIS — Z6835 Body mass index (BMI) 35.0-35.9, adult: Secondary | ICD-10-CM | POA: Diagnosis not present

## 2023-11-02 DIAGNOSIS — R278 Other lack of coordination: Secondary | ICD-10-CM | POA: Diagnosis not present

## 2023-11-02 NOTE — Telephone Encounter (Signed)
 MRI brain [employer] auth: [id number] exp. 04/30/24  MRI cervical [employer] medicare auth: [id number] exp. 04/30/24  sent to [facility] [phone]

## 2023-11-03 ENCOUNTER — Telehealth: Payer: Self-pay | Admitting: Family Medicine

## 2023-11-03 NOTE — Telephone Encounter (Signed)
 She is mostly wheelchair bound with limited transfers so I am not sure it would be helpful. Any other recommendations?

## 2023-11-03 NOTE — Telephone Encounter (Signed)
 Pt calling to follow up on if Dr. Epimenio Foot  will put me back on Dalfampridine, both legs are numb. Would like a call from the nurse.

## 2023-11-03 NOTE — Telephone Encounter (Signed)
 Amy- were you able to discuss dalfampridine with Dr. Epimenio Foot? What would you recommend?  Called pt. Leg numbness started 3 days ago. They feel asleep. Sx started as intermittent but now constant in last 2 days. Painful to touch. No color changes/swelling. Describes as nerve pain. Still taking gabapentin 300mg  po TID and baclofen 10mg  po TID. She asked about dalfampridine. I made her aware this will not help w/ numbness, helps improve walking in MS pt. Aware I will send to NP to see if she got updated from MD on this and what else she would recommend.    Pt last saw AL,NP 10/20/23. MS DMT: Briumvi. Called [facility] at [phone]. Spoke w/ rep. Last home infusion: 05/31/2023. Next infusion: 12/01/2023. Updated pt snapshot to include this info.

## 2023-11-04 DIAGNOSIS — R278 Other lack of coordination: Secondary | ICD-10-CM | POA: Diagnosis not present

## 2023-11-04 DIAGNOSIS — F330 Major depressive disorder, recurrent, mild: Secondary | ICD-10-CM | POA: Diagnosis not present

## 2023-11-04 DIAGNOSIS — Z9181 History of falling: Secondary | ICD-10-CM | POA: Diagnosis not present

## 2023-11-04 DIAGNOSIS — M6281 Muscle weakness (generalized): Secondary | ICD-10-CM | POA: Diagnosis not present

## 2023-11-04 DIAGNOSIS — E669 Obesity, unspecified: Secondary | ICD-10-CM | POA: Diagnosis not present

## 2023-11-04 DIAGNOSIS — Z6835 Body mass index (BMI) 35.0-35.9, adult: Secondary | ICD-10-CM | POA: Diagnosis not present

## 2023-11-04 DIAGNOSIS — G35 Multiple sclerosis: Secondary | ICD-10-CM | POA: Diagnosis not present

## 2023-11-04 DIAGNOSIS — F419 Anxiety disorder, unspecified: Secondary | ICD-10-CM | POA: Diagnosis not present

## 2023-11-04 MED ORDER — DALFAMPRIDINE ER 10 MG PO TB12: 10.0000 mg | ORAL_TABLET | Freq: Two times a day (BID) | ORAL | 3 refills | Status: AC

## 2023-11-04 NOTE — Telephone Encounter (Signed)
Called and spoke w/ pt. Relayed AL,NP message. Pt verbalized understanding and appreciation.

## 2023-11-04 NOTE — Addendum Note (Signed)
 Addended by: Shawnie Dapper L on: 11/04/2023 08:01 AM   Modules accepted: Orders

## 2023-11-04 NOTE — Telephone Encounter (Signed)
 Called pt. Mailbox not accepting messages, VM not set up.

## 2023-11-08 DIAGNOSIS — G35 Multiple sclerosis: Secondary | ICD-10-CM | POA: Diagnosis not present

## 2023-11-08 DIAGNOSIS — R278 Other lack of coordination: Secondary | ICD-10-CM | POA: Diagnosis not present

## 2023-11-08 DIAGNOSIS — Z9181 History of falling: Secondary | ICD-10-CM | POA: Diagnosis not present

## 2023-11-08 DIAGNOSIS — F330 Major depressive disorder, recurrent, mild: Secondary | ICD-10-CM | POA: Diagnosis not present

## 2023-11-08 DIAGNOSIS — F419 Anxiety disorder, unspecified: Secondary | ICD-10-CM | POA: Diagnosis not present

## 2023-11-08 DIAGNOSIS — E669 Obesity, unspecified: Secondary | ICD-10-CM | POA: Diagnosis not present

## 2023-11-08 DIAGNOSIS — M6281 Muscle weakness (generalized): Secondary | ICD-10-CM | POA: Diagnosis not present

## 2023-11-08 DIAGNOSIS — Z6835 Body mass index (BMI) 35.0-35.9, adult: Secondary | ICD-10-CM | POA: Diagnosis not present

## 2023-11-09 ENCOUNTER — Ambulatory Visit (HOSPITAL_COMMUNITY)
Admission: RE | Admit: 2023-11-09 | Discharge: 2023-11-09 | Disposition: A | Discharge status: Home / Self Care | Source: Ambulatory Visit | Attending: Family Medicine | Admitting: Family Medicine

## 2023-11-09 DIAGNOSIS — G319 Degenerative disease of nervous system, unspecified: Secondary | ICD-10-CM | POA: Diagnosis not present

## 2023-11-09 DIAGNOSIS — G35 Multiple sclerosis: Secondary | ICD-10-CM | POA: Insufficient documentation

## 2023-11-09 DIAGNOSIS — M50223 Other cervical disc displacement at C6-C7 level: Secondary | ICD-10-CM | POA: Diagnosis not present

## 2023-11-09 DIAGNOSIS — M4802 Spinal stenosis, cervical region: Secondary | ICD-10-CM | POA: Diagnosis not present

## 2023-11-09 MED ORDER — GADOBUTROL 1 MMOL/ML IV SOLN
9.0000 mL | Freq: Once | INTRAVENOUS | Status: AC | PRN
  Administered 2023-11-09: 9 mL via INTRAVENOUS

## 2023-11-10 DIAGNOSIS — G35 Multiple sclerosis: Secondary | ICD-10-CM | POA: Diagnosis not present

## 2023-11-10 DIAGNOSIS — Z6835 Body mass index (BMI) 35.0-35.9, adult: Secondary | ICD-10-CM | POA: Diagnosis not present

## 2023-11-10 DIAGNOSIS — R278 Other lack of coordination: Secondary | ICD-10-CM | POA: Diagnosis not present

## 2023-11-10 DIAGNOSIS — F330 Major depressive disorder, recurrent, mild: Secondary | ICD-10-CM | POA: Diagnosis not present

## 2023-11-10 DIAGNOSIS — F419 Anxiety disorder, unspecified: Secondary | ICD-10-CM | POA: Diagnosis not present

## 2023-11-10 DIAGNOSIS — Z9181 History of falling: Secondary | ICD-10-CM | POA: Diagnosis not present

## 2023-11-10 DIAGNOSIS — M6281 Muscle weakness (generalized): Secondary | ICD-10-CM | POA: Diagnosis not present

## 2023-11-10 DIAGNOSIS — E669 Obesity, unspecified: Secondary | ICD-10-CM | POA: Diagnosis not present

## 2023-11-15 ENCOUNTER — Telehealth: Payer: Self-pay | Admitting: Family Medicine

## 2023-11-15 DIAGNOSIS — Z6835 Body mass index (BMI) 35.0-35.9, adult: Secondary | ICD-10-CM | POA: Diagnosis not present

## 2023-11-15 DIAGNOSIS — G35 Multiple sclerosis: Secondary | ICD-10-CM | POA: Diagnosis not present

## 2023-11-15 DIAGNOSIS — Z9181 History of falling: Secondary | ICD-10-CM | POA: Diagnosis not present

## 2023-11-15 DIAGNOSIS — F330 Major depressive disorder, recurrent, mild: Secondary | ICD-10-CM | POA: Diagnosis not present

## 2023-11-15 DIAGNOSIS — E669 Obesity, unspecified: Secondary | ICD-10-CM | POA: Diagnosis not present

## 2023-11-15 DIAGNOSIS — M6281 Muscle weakness (generalized): Secondary | ICD-10-CM | POA: Diagnosis not present

## 2023-11-15 DIAGNOSIS — R278 Other lack of coordination: Secondary | ICD-10-CM | POA: Diagnosis not present

## 2023-11-15 DIAGNOSIS — F419 Anxiety disorder, unspecified: Secondary | ICD-10-CM | POA: Diagnosis not present

## 2023-11-15 NOTE — Telephone Encounter (Signed)
 Pt is asking for a call with results to MRI

## 2023-11-15 NOTE — Telephone Encounter (Signed)
 Called pt to let her know results still pending. She completed imaging at [facility] 11/09/23. Aware we will call her once results available.

## 2023-11-16 DIAGNOSIS — R278 Other lack of coordination: Secondary | ICD-10-CM | POA: Diagnosis not present

## 2023-11-16 DIAGNOSIS — M6281 Muscle weakness (generalized): Secondary | ICD-10-CM | POA: Diagnosis not present

## 2023-11-16 DIAGNOSIS — G35 Multiple sclerosis: Secondary | ICD-10-CM | POA: Diagnosis not present

## 2023-11-16 DIAGNOSIS — F330 Major depressive disorder, recurrent, mild: Secondary | ICD-10-CM | POA: Diagnosis not present

## 2023-11-16 DIAGNOSIS — Z6835 Body mass index (BMI) 35.0-35.9, adult: Secondary | ICD-10-CM | POA: Diagnosis not present

## 2023-11-16 DIAGNOSIS — Z9181 History of falling: Secondary | ICD-10-CM | POA: Diagnosis not present

## 2023-11-16 DIAGNOSIS — E669 Obesity, unspecified: Secondary | ICD-10-CM | POA: Diagnosis not present

## 2023-11-16 DIAGNOSIS — F419 Anxiety disorder, unspecified: Secondary | ICD-10-CM | POA: Diagnosis not present

## 2023-11-17 DIAGNOSIS — F419 Anxiety disorder, unspecified: Secondary | ICD-10-CM | POA: Diagnosis not present

## 2023-11-17 DIAGNOSIS — G35 Multiple sclerosis: Secondary | ICD-10-CM | POA: Diagnosis not present

## 2023-11-17 DIAGNOSIS — F330 Major depressive disorder, recurrent, mild: Secondary | ICD-10-CM | POA: Diagnosis not present

## 2023-11-17 DIAGNOSIS — E669 Obesity, unspecified: Secondary | ICD-10-CM | POA: Diagnosis not present

## 2023-11-23 ENCOUNTER — Telehealth: Payer: Self-pay | Admitting: Family Medicine

## 2023-11-23 DIAGNOSIS — G35 Multiple sclerosis: Secondary | ICD-10-CM

## 2023-11-23 DIAGNOSIS — R278 Other lack of coordination: Secondary | ICD-10-CM | POA: Diagnosis not present

## 2023-11-23 DIAGNOSIS — Z6835 Body mass index (BMI) 35.0-35.9, adult: Secondary | ICD-10-CM | POA: Diagnosis not present

## 2023-11-23 DIAGNOSIS — E669 Obesity, unspecified: Secondary | ICD-10-CM | POA: Diagnosis not present

## 2023-11-23 DIAGNOSIS — Z9181 History of falling: Secondary | ICD-10-CM | POA: Diagnosis not present

## 2023-11-23 DIAGNOSIS — G801 Spastic diplegic cerebral palsy: Secondary | ICD-10-CM

## 2023-11-23 DIAGNOSIS — M6281 Muscle weakness (generalized): Secondary | ICD-10-CM | POA: Diagnosis not present

## 2023-11-23 DIAGNOSIS — F330 Major depressive disorder, recurrent, mild: Secondary | ICD-10-CM | POA: Diagnosis not present

## 2023-11-23 DIAGNOSIS — R269 Unspecified abnormalities of gait and mobility: Secondary | ICD-10-CM

## 2023-11-23 DIAGNOSIS — F419 Anxiety disorder, unspecified: Secondary | ICD-10-CM | POA: Diagnosis not present

## 2023-11-23 NOTE — Telephone Encounter (Signed)
 Pt states Dr Epimenio Foot needs to fax a Rx to [employer] for a manual wheelchair for her ph#[phone] xt [phone] fax [phone]

## 2023-11-23 NOTE — Telephone Encounter (Signed)
 Amy approved order, printed and signed,  I called the below # and left message for Jeoffrey Massed to confirm the fax # as the patient said was not sure if that number was correct.

## 2023-11-23 NOTE — Telephone Encounter (Addendum)
 Amy are you okay with placing order for manual wheelchair?    Patient was last seen on 11/09/23 per note "She does not walk much. She is able to transfer from wheelchair to toilet. Has walker for transfers. Has power chair at home but is currently broken. She has appt for repairs in April. "  Pt said she her daughter is graduating she lives in [place] and pt will need a manual wheelchair to travel. Pt said her old manual wheelchair is broke. Pt will be leaving on 12/15/23.

## 2023-11-24 ENCOUNTER — Other Ambulatory Visit (HOSPITAL_COMMUNITY): Payer: Self-pay

## 2023-11-24 ENCOUNTER — Telehealth: Payer: Self-pay | Admitting: Pharmacy Technician

## 2023-11-24 NOTE — Telephone Encounter (Signed)
 I called today to get the MRIs read.

## 2023-11-24 NOTE — Telephone Encounter (Signed)
 Pharmacy Patient Advocate Encounter   Received notification from CoverMyMeds that prior authorization for Dalfampridine ER 10MG  er tablets is required/requested.   Insurance verification completed.   The patient is insured through [facility] .   Per test claim: PA required; PA submitted to above mentioned insurance via CoverMyMeds Key/confirmation #/EOC [id number] Status is pending

## 2023-11-25 DIAGNOSIS — F419 Anxiety disorder, unspecified: Secondary | ICD-10-CM | POA: Diagnosis not present

## 2023-11-25 DIAGNOSIS — E669 Obesity, unspecified: Secondary | ICD-10-CM | POA: Diagnosis not present

## 2023-11-25 DIAGNOSIS — Z9181 History of falling: Secondary | ICD-10-CM | POA: Diagnosis not present

## 2023-11-25 DIAGNOSIS — R278 Other lack of coordination: Secondary | ICD-10-CM | POA: Diagnosis not present

## 2023-11-25 DIAGNOSIS — Z6835 Body mass index (BMI) 35.0-35.9, adult: Secondary | ICD-10-CM | POA: Diagnosis not present

## 2023-11-25 DIAGNOSIS — G35 Multiple sclerosis: Secondary | ICD-10-CM | POA: Diagnosis not present

## 2023-11-25 DIAGNOSIS — F330 Major depressive disorder, recurrent, mild: Secondary | ICD-10-CM | POA: Diagnosis not present

## 2023-11-25 DIAGNOSIS — M6281 Muscle weakness (generalized): Secondary | ICD-10-CM | POA: Diagnosis not present

## 2023-11-25 NOTE — Telephone Encounter (Signed)
 I never heard back from Erin Berg, I called again and received her voicemail. She left her email address to send order. I sent order to [email or web address].   I called patient and informed of this as well.

## 2023-11-26 DIAGNOSIS — G35 Multiple sclerosis: Secondary | ICD-10-CM | POA: Diagnosis not present

## 2023-11-26 NOTE — Telephone Encounter (Signed)
 Pharmacy Patient Advocate Encounter  Received notification from [facility] that Prior Authorization for Dalfampridine ER 10MG  er tablets  has been APPROVED from 11/24/2023 to 08/16/2024   PA #/Case ID/Reference #: [id number]

## 2023-11-29 NOTE — Telephone Encounter (Signed)
 Called pt. Relayed per AL,NP: "Please let her know that her MRI of the brain and cervical spine are stable from the imaging in 2023. No new MS lesions."  Pt verbalized understanding and appreciation.

## 2023-12-01 DIAGNOSIS — G35 Multiple sclerosis: Secondary | ICD-10-CM | POA: Diagnosis not present

## 2023-12-01 DIAGNOSIS — F419 Anxiety disorder, unspecified: Secondary | ICD-10-CM | POA: Diagnosis not present

## 2023-12-01 DIAGNOSIS — Z9181 History of falling: Secondary | ICD-10-CM | POA: Diagnosis not present

## 2023-12-01 DIAGNOSIS — R278 Other lack of coordination: Secondary | ICD-10-CM | POA: Diagnosis not present

## 2023-12-01 DIAGNOSIS — Z6835 Body mass index (BMI) 35.0-35.9, adult: Secondary | ICD-10-CM | POA: Diagnosis not present

## 2023-12-01 DIAGNOSIS — F330 Major depressive disorder, recurrent, mild: Secondary | ICD-10-CM | POA: Diagnosis not present

## 2023-12-01 DIAGNOSIS — E669 Obesity, unspecified: Secondary | ICD-10-CM | POA: Diagnosis not present

## 2023-12-01 DIAGNOSIS — M6281 Muscle weakness (generalized): Secondary | ICD-10-CM | POA: Diagnosis not present

## 2023-12-02 DIAGNOSIS — F419 Anxiety disorder, unspecified: Secondary | ICD-10-CM | POA: Diagnosis not present

## 2023-12-02 DIAGNOSIS — F330 Major depressive disorder, recurrent, mild: Secondary | ICD-10-CM | POA: Diagnosis not present

## 2023-12-02 DIAGNOSIS — Z9181 History of falling: Secondary | ICD-10-CM | POA: Diagnosis not present

## 2023-12-02 DIAGNOSIS — Z6835 Body mass index (BMI) 35.0-35.9, adult: Secondary | ICD-10-CM | POA: Diagnosis not present

## 2023-12-02 DIAGNOSIS — H5203 Hypermetropia, bilateral: Secondary | ICD-10-CM | POA: Diagnosis not present

## 2023-12-02 DIAGNOSIS — R278 Other lack of coordination: Secondary | ICD-10-CM | POA: Diagnosis not present

## 2023-12-02 DIAGNOSIS — E669 Obesity, unspecified: Secondary | ICD-10-CM | POA: Diagnosis not present

## 2023-12-02 DIAGNOSIS — M6281 Muscle weakness (generalized): Secondary | ICD-10-CM | POA: Diagnosis not present

## 2023-12-02 DIAGNOSIS — G35 Multiple sclerosis: Secondary | ICD-10-CM | POA: Diagnosis not present

## 2023-12-09 ENCOUNTER — Other Ambulatory Visit: Payer: Self-pay | Admitting: Neurology

## 2023-12-09 DIAGNOSIS — M6281 Muscle weakness (generalized): Secondary | ICD-10-CM | POA: Diagnosis not present

## 2023-12-09 DIAGNOSIS — F330 Major depressive disorder, recurrent, mild: Secondary | ICD-10-CM | POA: Diagnosis not present

## 2023-12-09 DIAGNOSIS — R278 Other lack of coordination: Secondary | ICD-10-CM | POA: Diagnosis not present

## 2023-12-09 DIAGNOSIS — F419 Anxiety disorder, unspecified: Secondary | ICD-10-CM | POA: Diagnosis not present

## 2023-12-09 DIAGNOSIS — Z6835 Body mass index (BMI) 35.0-35.9, adult: Secondary | ICD-10-CM | POA: Diagnosis not present

## 2023-12-09 DIAGNOSIS — E669 Obesity, unspecified: Secondary | ICD-10-CM | POA: Diagnosis not present

## 2023-12-09 DIAGNOSIS — Z9181 History of falling: Secondary | ICD-10-CM | POA: Diagnosis not present

## 2023-12-09 DIAGNOSIS — G35 Multiple sclerosis: Secondary | ICD-10-CM | POA: Diagnosis not present

## 2023-12-09 NOTE — Telephone Encounter (Signed)
 Last seen on 10/20/23 Follow up scheduled on 05/24/24  Last vitamin d  level was normal at 68.8 did you want her to continue vitamin d  Rx or start OTC vitamin d ?

## 2023-12-13 DIAGNOSIS — R609 Edema, unspecified: Secondary | ICD-10-CM | POA: Diagnosis not present

## 2023-12-13 DIAGNOSIS — Z9181 History of falling: Secondary | ICD-10-CM | POA: Diagnosis not present

## 2023-12-13 DIAGNOSIS — G35 Multiple sclerosis: Secondary | ICD-10-CM | POA: Diagnosis not present

## 2023-12-13 DIAGNOSIS — F330 Major depressive disorder, recurrent, mild: Secondary | ICD-10-CM | POA: Diagnosis not present

## 2023-12-13 DIAGNOSIS — R5381 Other malaise: Secondary | ICD-10-CM | POA: Diagnosis not present

## 2023-12-13 DIAGNOSIS — G2582 Stiff-man syndrome: Secondary | ICD-10-CM | POA: Diagnosis not present

## 2023-12-15 DIAGNOSIS — G35 Multiple sclerosis: Secondary | ICD-10-CM | POA: Diagnosis not present

## 2023-12-15 DIAGNOSIS — R5381 Other malaise: Secondary | ICD-10-CM | POA: Diagnosis not present

## 2023-12-15 DIAGNOSIS — F329 Major depressive disorder, single episode, unspecified: Secondary | ICD-10-CM | POA: Diagnosis not present

## 2024-01-15 DIAGNOSIS — E785 Hyperlipidemia, unspecified: Secondary | ICD-10-CM | POA: Diagnosis not present

## 2024-01-15 DIAGNOSIS — G35 Multiple sclerosis: Secondary | ICD-10-CM | POA: Diagnosis not present

## 2024-01-17 ENCOUNTER — Other Ambulatory Visit: Payer: Self-pay | Admitting: Neurology

## 2024-01-18 NOTE — Telephone Encounter (Signed)
 Last seen on 10/20/23 Follow up scheduled on 05/24/24

## 2024-02-11 DIAGNOSIS — R5381 Other malaise: Secondary | ICD-10-CM | POA: Diagnosis not present

## 2024-02-11 DIAGNOSIS — G2582 Stiff-man syndrome: Secondary | ICD-10-CM | POA: Diagnosis not present

## 2024-02-11 DIAGNOSIS — R609 Edema, unspecified: Secondary | ICD-10-CM | POA: Diagnosis not present

## 2024-02-11 DIAGNOSIS — G35 Multiple sclerosis: Secondary | ICD-10-CM | POA: Diagnosis not present

## 2024-02-11 DIAGNOSIS — F330 Major depressive disorder, recurrent, mild: Secondary | ICD-10-CM | POA: Diagnosis not present

## 2024-02-11 DIAGNOSIS — Z9181 History of falling: Secondary | ICD-10-CM | POA: Diagnosis not present

## 2024-02-16 DIAGNOSIS — Z6835 Body mass index (BMI) 35.0-35.9, adult: Secondary | ICD-10-CM | POA: Diagnosis not present

## 2024-02-16 DIAGNOSIS — F419 Anxiety disorder, unspecified: Secondary | ICD-10-CM | POA: Diagnosis not present

## 2024-02-16 DIAGNOSIS — E559 Vitamin D deficiency, unspecified: Secondary | ICD-10-CM | POA: Diagnosis not present

## 2024-02-16 DIAGNOSIS — E782 Mixed hyperlipidemia: Secondary | ICD-10-CM | POA: Diagnosis not present

## 2024-02-16 DIAGNOSIS — E7849 Other hyperlipidemia: Secondary | ICD-10-CM | POA: Diagnosis not present

## 2024-02-16 DIAGNOSIS — G35 Multiple sclerosis: Secondary | ICD-10-CM | POA: Diagnosis not present

## 2024-03-13 ENCOUNTER — Telehealth: Payer: Self-pay | Admitting: Family Medicine

## 2024-03-13 NOTE — Telephone Encounter (Signed)
 Tanya(pharmacist) @ [facility] fax#[phone] is asking for a new Rx for pt's Ocrevus, she is asking it be faxed along with any medical visit notes from this year 2025.  [phone] is Tanya's personal cell#

## 2024-03-13 NOTE — Telephone Encounter (Signed)
 Took call from [place]/phone room and spoke w/ Glenys. Connection was bad. I ended call and called back.  Pt order for Briumvi expired 01/2024. They are needing new orders faxed with office notes. Aware we will work on this.  Order filled out/printed and waiting on MD signature

## 2024-03-13 NOTE — Telephone Encounter (Signed)
 Called Tanya/[facility] back. LVM

## 2024-03-14 NOTE — Telephone Encounter (Signed)
 Faxed order below to [facility] at [phone]. Received fax confirmation.

## 2024-03-16 NOTE — Telephone Encounter (Signed)
 Called Tanya/[facility] at [phone]. Confirmed they received updated orders and nothing further needed at this time. Pt not due for next infusion until 05/2024.

## 2024-03-29 ENCOUNTER — Telehealth: Payer: Self-pay | Admitting: Family Medicine

## 2024-03-29 NOTE — Telephone Encounter (Signed)
 See 03/13/24 telephone encounter.

## 2024-03-29 NOTE — Telephone Encounter (Addendum)
 Took call from phone room and spoke w/ Gina/[facility]. They received referral yesterday for pt since [facility] closed a couple weeks ago. Aware we were not aware and just sent recent orders. Aware I will call back once I look further into options for pt.  I called Delon Schmitz with [facility] who confirmed she can help transition pt to them. I provided pt info and she will go ahead and start processing referral.   I called [facility] back at [phone] and spoke w/ Tillman. Relayed we have decided for pt to stay with [facility] just different site and will be transitioning her. She verbalized understanding.  I called pt at [phone]. Updated her about change. She confirmed she got letter in mail yesterday about [place] location being closed. She is agreeable to change. She will be on look out for phone call from [facility] location. She is unsure of exact date of last infusion. Feels 12/01/23 sounds correct.   I called [facility] at [phone] option 2 for pharmacy staff. Forwarded me to VM. I called back. Chose option 1 and LVM asking for call back to try and confirm last infusion date.

## 2024-03-29 NOTE — Telephone Encounter (Signed)
 Delon Lorilee Stann Vikki asked that I call her via email. I called and she states corporate stating pt already established with [facility]. I relayed I spoke with that location and told them we do not need their service and already establishing with another location [facility]). She will f/u with corporate and get things updated. Nothing further needed.

## 2024-03-29 NOTE — Telephone Encounter (Signed)
 Called [facility] back and spoke w/ Delon. [facility] sent referral to them. I relayed [facility] already agreed to take on pt and pt will be serviced by them. She verbalized understanding.

## 2024-03-29 NOTE — Telephone Encounter (Signed)
 [facility] is closing and [facility]. We will service her at her home. Patient is home bound and in home would work best for her. Contact Information:[phone]

## 2024-03-29 NOTE — Telephone Encounter (Signed)
 Jena @ [facility] has called asking for Dr Duncan RN

## 2024-03-29 NOTE — Telephone Encounter (Signed)
 Took call from referrals and spoke w/ Glenys from [facility]. States referral sent to [facility] in error, they were handling referring to other [facility] locations. I relayed we already started process for pt and no need. She states pt last infusion 11/26/23. I forwarded to [employer].

## 2024-04-03 NOTE — Telephone Encounter (Signed)
 Faxed signed order to [facility] at [phone]. Received fax confirmation.

## 2024-04-25 ENCOUNTER — Telehealth: Payer: Self-pay | Admitting: *Deleted

## 2024-04-25 DIAGNOSIS — G35 Multiple sclerosis: Secondary | ICD-10-CM | POA: Diagnosis not present

## 2024-05-16 ENCOUNTER — Telehealth: Payer: Self-pay | Admitting: *Deleted

## 2024-05-16 NOTE — Telephone Encounter (Signed)
 Faxed updated order below to [facility]. Received fax confirmation.

## 2024-05-24 ENCOUNTER — Encounter: Payer: Self-pay | Admitting: Neurology

## 2024-05-24 ENCOUNTER — Ambulatory Visit: Admitting: Neurology

## 2024-05-24 VITALS — BP 115/73 | HR 73 | Ht 65.0 in

## 2024-05-24 DIAGNOSIS — G801 Spastic diplegic cerebral palsy: Secondary | ICD-10-CM

## 2024-05-24 DIAGNOSIS — R32 Unspecified urinary incontinence: Secondary | ICD-10-CM | POA: Diagnosis not present

## 2024-05-24 DIAGNOSIS — Z79899 Other long term (current) drug therapy: Secondary | ICD-10-CM | POA: Diagnosis not present

## 2024-05-24 DIAGNOSIS — R269 Unspecified abnormalities of gait and mobility: Secondary | ICD-10-CM

## 2024-05-24 DIAGNOSIS — G35C1 Active secondary progressive multiple sclerosis: Secondary | ICD-10-CM

## 2024-05-24 DIAGNOSIS — E559 Vitamin D deficiency, unspecified: Secondary | ICD-10-CM | POA: Diagnosis not present

## 2024-05-24 MED ORDER — VITAMIN D (ERGOCALCIFEROL) 50000 UNITS PO CAPS: 1.0000 | ORAL_CAPSULE | ORAL | 3 refills | Status: AC

## 2024-05-24 NOTE — Progress Notes (Signed)
 [facility]  PATIENT: Erin Berg DOB: 1966-04-01  REFERRING DOCTOR OR PCP: Dr. Afton Louder; Dr. Jerilynn Carnes SOURCE: Patient, notes from primary care, notes from Dr. Jenel  _________________________________   HISTORICAL  CHIEF COMPLAINT:  Chief Complaint  Patient presents with   Follow-up    Pt in room 11. Erin Berg in room. Here for MS follow up.    HISTORY OF PRESENT ILLNESS:  Erin Berg is a 58 y.o. woman with active SPMS  05/25/2023: She now is on Briumvi since 2024 (previously was on Tecfidera ) and had her last infusion a week ago (home health).    She had had an MS exacerbation in 2023 she tolerated it well.  She felt it 'wearing off'  for a month.  MRI 11/09/2023 was stable  Currently, she has a very poor gait and can only do a couple steps with a walker - really just pivoting than walking.  The right leg  seems a little weaker than left    She uses a transport chair and can get to the toilet.  .   She takes dalfampridine  with mild benefit in leg strength.  She never recovered much since the last exacerbation.  Although right leg seems a little weaker, she has leg spasticity, left > right.  She takes baclofen  10 mg 3 times a day. Her trunk is also  weak but arms are strong .  However reduced RAM on left.    She has some dysesthesias and takes gabapentin  with benefit  She had done PT with benefit and her PCP reportedly has requested 6 more weeks  She has urinary incontinence x years.  She wears Depends.    Vision is unchanged and she feels acuity and color saturation are symmetric.  She notes some fatigue.  She denies depression.  Sleep is variable but she sleeps well most nights.  She notes no recent changes in cognition.    MS history: She is a 15 year woman who ws diagnosed with MS in 1998.  First symptoms were in 1996 when she had trouble writing for a week or two.  In 1998, she had numbness and clumsiness on her left and she went  to the ED.   She was diagnosed with MS.   She saw Dr. Jenel and was placed on Betaseron.   At some point she saw a doctor at [facility] (Dr. Juliane) and srarted seeing Dr. Milton when Dr. Juliane moved away.    She switched to Tecfidera  (unsure when but at least since before 2020).   She had generally done ok without relapse but her gait slowly worsened.  Switched to Briumvi in 2024.     In early August, 2023,  she had numbness that worsened below her waist and spasms in her left leg associated with a lot of pain.   Of note, she was on Tecfidera  at the time  Imaging  MRI of the brain nad cervicals spine 11/09/2023 showed no new lesions  MRI of the brain with and without contrast 03/20/2022 showed multiple T2/FLAIR hyperintense foci with confluencies in the periatrial and deep white matter.  There is an enhancing lesion in the right temporal lobe and smaller focus in the right frontal lobe.  More than expected atrophy for age.  This study was compared to the MRI from 02/08/2020 and there are just the enhancing foci that appear to be new in that interim  MRI cervical and thoracic spine 8/4/2023with and without contrast  show T2 hyperintense foci posteriorly adjacent to C2-C3, possibly to the left adjacent to C3, anterolaterally to the left adjacent to T3, centrally adjacent to T11.  Movement artifact is noted on the spine images.  None of the foci appear to be acute.   There are also degenerative changes with protrusion at C5-C6.  No spinal stenosis.  REVIEW OF SYSTEMS: Constitutional: No fevers, chills, sweats, or change in appetite Eyes: No visual changes, double vision, eye pain Ear, nose and throat: No hearing loss, ear pain, nasal congestion, sore throat Cardiovascular: No chest pain, palpitations Respiratory:  No shortness of breath at rest or with exertion.   No wheezes GastrointestinaI: No nausea, vomiting, diarrhea, abdominal pain, fecal incontinence Genitourinary: She has  incontinence. Musculoskeletal:  No neck pain, back pain Integumentary: No rash, pruritus, skin lesions Neurological: as above Psychiatric: No depression at this time.  No anxiety Endocrine: No palpitations, diaphoresis, change in appetite, change in weigh or increased thirst Hematologic/Lymphatic:  No anemia, purpura, petechiae. Allergic/Immunologic: No itchy/runny eyes, nasal congestion, recent allergic reactions, rashes  ALLERGIES: Allergies  Allergen Reactions   Sulfamethoxazole-Trimethoprim Hives    HOME MEDICATIONS:  Current Outpatient Medications:    acetaminophen  (TYLENOL ) 500 MG tablet, Take 1,000 mg by mouth every 6 (six) hours as needed for moderate pain., Disp: , Rfl:    baclofen  (LIORESAL ) 10 MG tablet, Take 1 tablet (10 mg total) by mouth 3 (three) times daily., Disp: 270 tablet, Rfl: 4   cetirizine (ZYRTEC) 10 MG tablet, Take 10 mg by mouth daily., Disp: , Rfl:    Cranberry-Vitamin C-Probiotic (AZO CRANBERRY PO), Take by mouth. 2 tablets by mouth daily, Disp: , Rfl:    dalfampridine  10 MG TB12, Take 1 tablet (10 mg total) by mouth 2 (two) times daily., Disp: 180 tablet, Rfl: 3   Fish Oil-Cholecalciferol (FISH OIL + D3 PO), Take 1,400 mg by mouth daily., Disp: , Rfl:    FLUoxetine  (PROZAC ) 20 MG capsule, Take 20 mg by mouth daily., Disp: , Rfl:    gabapentin  (NEURONTIN ) 300 MG capsule, Take 300 mg by mouth 3 (three) times daily., Disp: , Rfl:    Magnesium 200 MG TABS, Take 1 tablet by mouth daily., Disp: , Rfl:    rosuvastatin  (CRESTOR ) 5 MG tablet, Take 5 mg by mouth at bedtime., Disp: , Rfl:    Vitamin D , Ergocalciferol , 50000 units CAPS, Take 1 capsule (50,000 Units total) by mouth once a week., Disp: 13 capsule, Rfl: 3  PAST MEDICAL HISTORY: Past Medical History:  Diagnosis Date   Anxiety and depression    BMI 24.0-24.9, adult    MS (multiple sclerosis)    Multiple sclerosis     PAST SURGICAL HISTORY: Past Surgical History:  Procedure Laterality Date    COLONOSCOPY WITH PROPOFOL  N/A 12/22/2021   Procedure: COLONOSCOPY WITH PROPOFOL ;  Surgeon: Shaaron Lamar HERO, MD;  [facility];  Service: Endoscopy;  Laterality: N/A;  12:30 / ASA 2   TONSILLECTOMY AND ADENOIDECTOMY      FAMILY HISTORY: Family History  Problem Relation Age of Onset   Breast cancer Mother    Colon cancer Maternal Grandmother    Breast cancer Paternal Grandmother     SOCIAL HISTORY:  Social History   Socioeconomic History   Marital status: Divorced    Spouse name: Not on file   Number of children: Not on file   Years of education: Not on file   Highest education level: Not on file  Occupational History   Not  on file  Tobacco Use   Smoking status: Former    Types: Cigarettes   Smokeless tobacco: Never  Vaping Use   Vaping status: Never Used  Substance and Sexual Activity   Alcohol use: No    Alcohol/week: 0.0 standard drinks of alcohol   Drug use: No   Sexual activity: Not on file  Other Topics Concern   Not on file  Social History Narrative   Right Handed   1 Cup of Coffee every now and then    Social Drivers of Health   Financial Resource Strain: Not on file  Food Insecurity: Not on file  Transportation Needs: Not on file  Physical Activity: Not on file  Stress: Not on file  Social Connections: Not on file  Intimate Partner Violence: Not on file     PHYSICAL EXAM  Vitals:   05/24/24 1525  BP: 115/73  Pulse: 73  SpO2: 99%  Height: 5' 5 (1.651 m)    Body mass index is 35.94 kg/m.   General: The patient is well-developed and well-nourished and in no acute distress  HEENT:  Head is [place]/AT.  Sclera are anicteric.  Skin/Ext: Extremities are without rash or  edema.   Neurologic Exam  Mental status: The patient is alert and oriented x 3 at the time of the examination. The patient has apparent normal recent and remote memory, with an apparently normal attention span and concentration ability.   Speech is normal.  Cranial  nerves: Extraocular movements are full.  Facial strength and sensation was normal.  No dysarthria.. No obvious hearing deficits are noted.  Motor:  Muscle bulk is normal.   Tone is increased in the legs, left greater than right. Strength is  5 / 5 in the right arm and 4+/5 predominantly in the left arm with 5 - grip.   Strength is 4+/5 in the right leg and 4/5 in the left iliopsoas, 4/+5 left quadriceps and 3/5 EHL/ankle extension.    Sensory: She has normal and symmetric sensation in the arms and legs.  Coordination: Cerebellar testing reveals good finger-nose-finger on the right and reduced on the left.  She has difficulty with heel-to-shin.  Gait and station: She needs strong bilateral support to stand up and has difficulty raising her left leg.  She cannot tandem walk.  Romberg cannot be safely tested  Reflexes: Deep tendon reflexes are symmetric and normal bilaterally.   Plantar responses are flexor.    DIAGNOSTIC DATA (LABS, IMAGING, TESTING) - I reviewed patient records, labs, notes, testing and imaging myself where available.  Lab Results  Component Value Date   WBC 8.8 10/20/2023   HGB 13.9 10/20/2023   HCT 44.4 10/20/2023   MCV 84 10/20/2023   PLT 270 10/20/2023      Component Value Date/Time   NA 142 04/16/2022 1454   K 4.7 04/16/2022 1454   CL 100 04/16/2022 1454   CO2 25 04/16/2022 1454   GLUCOSE 111 (H) 04/16/2022 1454   GLUCOSE 154 (H) 03/21/2022 0421   BUN 20 04/16/2022 1454   CREATININE 0.56 (L) 04/16/2022 1454   CALCIUM  9.7 04/16/2022 1454   PROT 7.1 04/16/2022 1454   ALBUMIN 4.8 04/16/2022 1454   AST 21 04/16/2022 1454   ALT 31 04/16/2022 1454   ALKPHOS 81 04/16/2022 1454   BILITOT 0.4 04/16/2022 1454   GFRNONAA >60 03/21/2022 0421       ASSESSMENT AND PLAN   Active secondary progressive multiple sclerosis  Spastic diplegia (HCC)  Gait disturbance  High risk medication use  Vitamin D  deficiency  Urinary incontinence, unspecified  type   Continue Briumvi for her active SPMS.  Check IgG/IgM, and CBC/D.   Continue Vit D 50000 weekly Renew baclofen  and take tid.   Rtc 6 months  This visit is part of a comprehensive longitudinal care medical relationship regarding the patients primary diagnosis of multiple sclerosis and related concerns.   Michalene Debruler A. Vear, MD, [facility] 05/24/2024, 3:47 PM Certified in Neurology, Clinical Neurophysiology, Sleep Medicine and Neuroimaging  [facility] [address], [address] [place], [place] [address] [phone]

## 2024-05-25 ENCOUNTER — Ambulatory Visit: Payer: Self-pay | Admitting: Neurology

## 2024-05-25 LAB — CBC WITH DIFFERENTIAL/PLATELET
Basophils Absolute: 0.1 x10E3/uL (ref 0.0–0.2)
Basos: 1 %
EOS (ABSOLUTE): 0 x10E3/uL (ref 0.0–0.4)
Eos: 0 %
Hematocrit: 42.3 % (ref 34.0–46.6)
Hemoglobin: 13 g/dL (ref 11.1–15.9)
Immature Grans (Abs): 0 x10E3/uL (ref 0.0–0.1)
Immature Granulocytes: 0 %
Lymphocytes Absolute: 1.5 x10E3/uL (ref 0.7–3.1)
Lymphs: 17 %
MCH: 26.3 pg — ABNORMAL LOW (ref 26.6–33.0)
MCHC: 30.7 g/dL — ABNORMAL LOW (ref 31.5–35.7)
MCV: 86 fL (ref 79–97)
Monocytes Absolute: 0.8 x10E3/uL (ref 0.1–0.9)
Monocytes: 9 %
Neutrophils Absolute: 6.6 x10E3/uL (ref 1.4–7.0)
Neutrophils: 73 %
Platelets: 263 x10E3/uL (ref 150–450)
RBC: 4.95 x10E6/uL (ref 3.77–5.28)
RDW: 13.6 % (ref 11.7–15.4)
WBC: 9.1 x10E3/uL (ref 3.4–10.8)

## 2024-05-25 LAB — IGG, IGA, IGM
IgA/Immunoglobulin A, Serum: 166 mg/dL (ref 87–352)
IgG (Immunoglobin G), Serum: 828 mg/dL (ref 586–1602)
IgM (Immunoglobulin M), Srm: 86 mg/dL (ref 26–217)

## 2024-05-25 NOTE — Telephone Encounter (Addendum)
 Called pt and let her know the below Lab Results. Pt Thanked me for calling.   ----- Message from Charlie DELENA Crete sent at 05/25/2024 12:27 PM EDT ----- Please let her know that the blood work was fine ----- Message ----- From: Interface, [facility] Lab Results In Sent: 05/25/2024   5:37 AM EDT To: Charlie DELENA Crete, MD

## 2024-06-20 NOTE — Therapy (Signed)
 OUTPATIENT PHYSICAL THERAPY NEURO EVALUATION   Patient Name: Erin Berg MRN: [id number] DOB:May 01, 1966, 58 y.o., female Today's Date: 06/21/2024    END OF SESSION:  PT End of Session - 06/21/24 1606     Visit Number 1    Date for Recertification  08/02/24    Authorization Type [employer] MEDICARE HMO/PPO    Authorization Time Period no auth required    Authorization - Visit Number 1    Progress Note Due on Visit 10    PT Start Time 1330    PT Stop Time 1412    PT Time Calculation (min) 42 min    Activity Tolerance Patient tolerated treatment well    Behavior During Therapy WFL for tasks assessed/performed          Past Medical History:  Diagnosis Date   Anxiety and depression    BMI 24.0-24.9, adult    MS (multiple sclerosis)    Multiple sclerosis    Past Surgical History:  Procedure Laterality Date   COLONOSCOPY WITH PROPOFOL  N/A 12/22/2021   Procedure: COLONOSCOPY WITH PROPOFOL ;  Surgeon: Shaaron Lamar HERO, MD;  [facility];  Service: Endoscopy;  Laterality: N/A;  12:30 / ASA 2   TONSILLECTOMY AND ADENOIDECTOMY     Patient Active Problem List   Diagnosis Date Noted   High risk medication use 04/16/2022   Multiple sclerosis exacerbation 03/20/2022   Hyperlipidemia 03/20/2022   Hypokalemia 03/20/2022   Pyuria 03/20/2022   Taking multiple medications for chronic disease 10/17/2018   Allergic rhinitis 10/14/2007    PCP: Bertell Satterfield, MD  REFERRING PROVIDER: Vear Charlie LABOR, MD ONSET DATE: several months  REFERRING DIAG: 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 R26.9 (ICD-10-CM) - Gait disturbance  THERAPY DIAG:  Multiple sclerosis, secondary progressive  Impaired functional mobility, balance, gait, and endurance  Weakness of both lower extremities  History of falling  Rationale for Evaluation and Treatment: Rehabilitation  SUBJECTIVE:                                                                                                                                                                                              SUBJECTIVE STATEMENT: Pt states she was diagnoses with MS in the 90s after her left side went completely numb. Pt and caregiver states there has been a decreased amount of strength noted with bathroom transfers. Pt has fallen in the last 6 months, 4-5 falls during transfers. Struggling with transfers especially with toilet transfer. Pt and caregiver did not like home health.  Pt accompanied by: home health aide  PERTINENT HISTORY:  -Multiple Sclerosis, 1998  PAIN:  Are you having pain? No  PRECAUTIONS: Fall  RED FLAGS: None   WEIGHT BEARING RESTRICTIONS: No  FALLS: Has patient fallen in last 6 months? Yes. Number of falls 4-5  LIVING ENVIRONMENT: Lives with: lives alone and has aide 55 hours a week Lives in: House/apartment Stairs: ramped entrance Has following equipment at home: Environmental Consultant - 2 wheeled, Wheelchair (power), Wheelchair (manual), shower chair, bed side commode, and Ramped entry  PLOF: Needs assistance with ADLs, Needs assistance with homemaking, Needs assistance with gait, and Needs assistance with transfers  PATIENT GOALS: strengthen core, be more independent with transfers, decrease the feeling of feet sticking to floor (coordination)  OBJECTIVE:  Note: Objective measures were completed at Evaluation unless otherwise noted.  DIAGNOSTIC FINDINGS: CLINICAL DATA:  Multiple sclerosis   EXAM: MRI HEAD WITHOUT AND WITH CONTRAST   MRI CERVICAL SPINE WITHOUT AND WITH CONTRAST   TECHNIQUE: Multiplanar, multiecho pulse sequences of the brain and surrounding structures, and cervical spine, to include the craniocervical junction and cervicothoracic junction, were obtained without and with intravenous contrast.   CONTRAST:  9mL GADAVIST  GADOBUTROL  1 MMOL/ML IV SOLN   COMPARISON:  03/20/2022   FINDINGS: MRI HEAD FINDINGS   Brain: Confluent FLAIR  hyperintensity in the cerebral white matter attributed to history of multiple sclerosis with premature generalized brain atrophy especially affecting the white matter with marked corpus callosum thinning. No enhancing plaque or progressive white matter disease. No evidence of superimposed mass, opportunistic infection, or interval infarction.   Vascular: Major flow voids and vascular enhancements are preserved   Skull and upper cervical spine: Normal marrow signal.   Sinuses/Orbits: Unremarkable   MRI CERVICAL SPINE FINDINGS   Alignment: Reversal of cervical lordosis.   Vertebrae: No fracture, evidence of discitis, or bone lesion.   Cord: Assessment Hendrick by motion artifact. No convincing plaque seen today. No abnormal intrathecal enhancement.   Posterior Fossa, vertebral arteries, paraspinal tissues: No perispinal mass or inflammation.   Disc levels: Focal disc space narrowing and endplate degeneration at C5-6 and C6-7 with uncovertebral spurring causing mild bilateral foraminal narrowing based on postcontrast axial images. Minor disc bulging and C6-7.   IMPRESSION: Brain MRI:   Confluent chronic demyelination with white matter atrophy. No enhancing or progressive disease when compared to 2023.   Cervical MRI:   Degraded by motion artifact with no detected plaque today. No cord thinning or abnormal enhancement.  COGNITION: Overall cognitive status: Within functional limits for tasks assessed   SENSATION: WFL  COORDINATION: UE alternating movements and heel to shin difficulty noted   POSTURE: rounded shoulders, posterior pelvic tilt, and flexed trunk   UPPER EXTREMITY MMT:  MMT Right eval Left eval  Shoulder flexion 3+, pain 3  Shoulder extension 3 3  Shoulder abduction 3+, pain 3  Shoulder adduction    Shoulder internal rotation    Shoulder external rotation    Middle trapezius    Lower trapezius    Elbow flexion 4- 4-  Elbow extension 4- 4-   Wrist flexion 3 3  Wrist extension 4- 3  Wrist ulnar deviation    Wrist radial deviation    Wrist pronation    Wrist supination    Grip strength (lbs)    (Blank rows = not tested)  LOWER EXTREMITY MMT:    MMT Right Eval Left Eval  Hip flexion 3 3  Hip extension    Hip abduction 3 3  Hip adduction 3 3  Hip internal rotation    Hip external  rotation    Knee flexion 3+ 3-  Knee extension 3+ 3-  Ankle dorsiflexion 3 4+  Ankle plantarflexion    Ankle inversion    Ankle eversion    (Blank rows = not tested)  BED MOBILITY:  Findings: Sit to supine Complete Independence Supine to sit Modified independence, increased time  TRANSFERS: Sit to stand: Mod A  Assistive device utilized: None     Chair to chair: Mod A  Assistive device utilized: None       GAIT: Findings: Gait Characteristics: shuffling, trunk flexed, poor foot clearance- Right, and poor foot clearance- Left, Distance walked: 3 feet, Level of assistance: Mod A, and Comments: decreased ability to move LLE during weight bearing, mod assist required  FUNCTIONAL TESTS:  30 second chair stand test: TBA Berg balance: TBA  PATIENT SURVEYS:  ABC scale:                                                                                                                                TREATMENT DATE:  06/20/2024   Evaluation: -ROM measured, Strength assessed, HEP prescribed, pt educated on prognosis, findings, and importance of HEP compliance if given.       PATIENT EDUCATION: Education details: Pt was educated on findings of PT evaluation, prognosis, frequency of therapy visits and rationale, attendance policy, and HEP if given.   Person educated: Patient and Biomedical Engineer Education method: Explanation, Verbal cues, and Handouts Education comprehension: verbalized understanding, verbal cues required, tactile cues required, and needs further education  HOME EXERCISE PROGRAM: Access Code: [email or web address] URL:  [email or web address] Date: 06/21/2024 Prepared by: Lang Ada  Exercises - Supine Bridge  - 1 x daily - 7 x weekly - 3 sets - 10 reps - 3 hold - Supine Active Straight Leg Raise  - 1 x daily - 7 x weekly - 3 sets - 10 reps - Seated Long Arc Quad  - 1 x daily - 7 x weekly - 3 sets - 10 reps - Neutral Curl Up with Straight Leg  - 1 x daily - 7 x weekly - 3 sets - 10 reps - 3 hold - Seated Heel Toe Raises  - 1 x daily - 7 x weekly - 3 sets - 10 reps  GOALS: Goals reviewed with patient? No  SHORT TERM GOALS: Target date: 07/12/24  Pt will be independent with HEP in order to demonstrate participation in Physical Therapy POC.  Baseline: Goal status: INITIAL  2.  Pt will improve ABC score by 15% in order to demonstrate improved pain with functional goals and outcomes. Baseline: see objective Goal status: INITIAL  LONG TERM GOALS: Target date: 08/02/24  Pt will increase BERG balance score by > 12.5 points in order to demonstrate improved functional safety and balance skills in ADL/mobility.   Baseline: see objective.  Goal status: INITIAL  2.  Pt will improve 30 Second Chair Stand Test by 2 reps in order to  demonstrate improved functional mobility capacity in community setting.  Baseline: see objective.  Goal status: INITIAL  3.  Pt will improve ABC score by 30% in order to demonstrate improved pain with functional goals and outcomes. Baseline: see objective.  Goal status: INITIAL  4.  Pt will improve chair to chair transfer with SBA in order to demonstrate improved capacity, safety, and overall quality of movement to optimize function.  Baseline: see objective.  Goal status: INITIAL   ASSESSMENT:  CLINICAL IMPRESSION: Patient is a 58 y.o. 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   Patient demonstrates decreased UE/LE  strength, abnormal pain with abduction and flexion with R shoulder, impaired functional mobility and impaired balance. Patient also demonstrates difficulty with chair to chair transfer with need for mod assist for standing and initiation of LLE movement during weight bearing. Patient also demonstrates decreased standing balance with inability to stand without hand held assist. Pt modified independent with bed mobility with scooting and supine to sit and sit to supine with increased time required for movents. Patient requires education on role of PT, fall risk, increased physical activity, HEP compliance and overall safety recommendations. Patient would benefit from skilled physical therapy for increased endurance with functional mobility, increased LE/UE strength, increased independence with functional transfers and improved balance for improved gait quality, return to higher level of function with ADLs, and progress towards therapy goals.   OBJECTIVE IMPAIRMENTS: Abnormal gait, decreased activity tolerance, decreased balance, decreased coordination, decreased endurance, decreased mobility, difficulty walking, decreased strength, impaired flexibility, postural dysfunction, and pain.   ACTIVITY LIMITATIONS: carrying, lifting, bending, standing, squatting, stairs, transfers, and bed mobility  PARTICIPATION LIMITATIONS: meal prep, cleaning, laundry, shopping, community activity, and yard work  PERSONAL FACTORS: Age, Fitness, Past/current experiences, Time since onset of injury/illness/exacerbation, and 1 comorbidity: MS are also affecting patient's functional outcome.   REHAB POTENTIAL: Fair neurologic pathology  CLINICAL DECISION MAKING: Evolving/moderate complexity  EVALUATION COMPLEXITY: Moderate  PLAN:  PT FREQUENCY: 1-2x/week  PT DURATION: 6 weeks  PLANNED INTERVENTIONS: 97110-Therapeutic exercises, 97530- Therapeutic activity, 97112- Neuromuscular re-education, 97535- Self Care, [id number]-  Manual therapy, [phone]- Gait training, Patient/Family education, Balance training, Stair training, DME instructions, Cryotherapy, and Moist heat  PLAN FOR NEXT SESSION: BERG and chair stand test, progress core and extremity strengthening, progress bed mobility and functional mobility, ABC scale for balance    Lang Ada, PT, DPT [facility] Office: [phone] 4:10 PM, 06/21/24

## 2024-06-21 ENCOUNTER — Ambulatory Visit (HOSPITAL_COMMUNITY): Attending: Neurology

## 2024-06-21 ENCOUNTER — Encounter (HOSPITAL_COMMUNITY): Payer: Self-pay

## 2024-06-21 ENCOUNTER — Other Ambulatory Visit: Payer: Self-pay

## 2024-06-21 DIAGNOSIS — Z9181 History of falling: Secondary | ICD-10-CM | POA: Insufficient documentation

## 2024-06-21 DIAGNOSIS — R269 Unspecified abnormalities of gait and mobility: Secondary | ICD-10-CM | POA: Diagnosis not present

## 2024-06-21 DIAGNOSIS — Z7409 Other reduced mobility: Secondary | ICD-10-CM | POA: Insufficient documentation

## 2024-06-21 DIAGNOSIS — R29898 Other symptoms and signs involving the musculoskeletal system: Secondary | ICD-10-CM | POA: Diagnosis not present

## 2024-06-21 DIAGNOSIS — G35C1 Active secondary progressive multiple sclerosis: Secondary | ICD-10-CM | POA: Diagnosis not present

## 2024-06-21 DIAGNOSIS — G35C0 Secondary progressive multiple sclerosis, unspecified: Secondary | ICD-10-CM | POA: Diagnosis not present

## 2024-07-03 ENCOUNTER — Other Ambulatory Visit: Payer: Self-pay | Admitting: Internal Medicine

## 2024-07-03 DIAGNOSIS — Z1231 Encounter for screening mammogram for malignant neoplasm of breast: Secondary | ICD-10-CM

## 2024-07-07 ENCOUNTER — Encounter (HOSPITAL_COMMUNITY): Payer: Self-pay

## 2024-07-07 ENCOUNTER — Ambulatory Visit (HOSPITAL_COMMUNITY)

## 2024-07-07 DIAGNOSIS — Z9181 History of falling: Secondary | ICD-10-CM

## 2024-07-07 DIAGNOSIS — G35C1 Active secondary progressive multiple sclerosis: Secondary | ICD-10-CM | POA: Diagnosis not present

## 2024-07-07 DIAGNOSIS — Z7409 Other reduced mobility: Secondary | ICD-10-CM | POA: Diagnosis not present

## 2024-07-07 DIAGNOSIS — R29898 Other symptoms and signs involving the musculoskeletal system: Secondary | ICD-10-CM

## 2024-07-07 DIAGNOSIS — R269 Unspecified abnormalities of gait and mobility: Secondary | ICD-10-CM | POA: Diagnosis not present

## 2024-07-07 DIAGNOSIS — G35C0 Secondary progressive multiple sclerosis, unspecified: Secondary | ICD-10-CM

## 2024-07-07 NOTE — Therapy (Signed)
 OUTPATIENT PHYSICAL THERAPY NEURO TREATMENT   Patient Name: Erin Berg MRN: [id number] DOB:Apr 01, 1966, 58 y.o., female Today's Date: 07/07/2024    END OF SESSION:  PT End of Session - 07/07/24 1419     Visit Number 2    Number of Visits 12    Date for Recertification  08/02/24    Authorization Type [employer] MEDICARE HMO/PPO    Authorization Time Period no auth required    Progress Note Due on Visit 10    PT Start Time 1419    PT Stop Time 1457    PT Time Calculation (min) 38 min    Equipment Utilized During Treatment Gait belt    Activity Tolerance Patient tolerated treatment well    Behavior During Therapy WFL for tasks assessed/performed          Past Medical History:  Diagnosis Date   Anxiety and depression    BMI 24.0-24.9, adult    MS (multiple sclerosis)    Multiple sclerosis    Past Surgical History:  Procedure Laterality Date   COLONOSCOPY WITH PROPOFOL  N/A 12/22/2021   Procedure: COLONOSCOPY WITH PROPOFOL ;  Surgeon: Shaaron Lamar HERO, MD;  Location: [facility];  Service: Endoscopy;  Laterality: N/A;  12:30 / ASA 2   TONSILLECTOMY AND ADENOIDECTOMY     Patient Active Problem List   Diagnosis Date Noted   High risk medication use 04/16/2022   Multiple sclerosis exacerbation 03/20/2022   Hyperlipidemia 03/20/2022   Hypokalemia 03/20/2022   Pyuria 03/20/2022   Taking multiple medications for chronic disease 10/17/2018   Allergic rhinitis 10/14/2007    PCP: Bertell Satterfield, MD  REFERRING PROVIDER: Vear Charlie LABOR, MD ONSET DATE: several months  REFERRING DIAG: 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  Has began HEP without questions.    Eval:  Pt states she was diagnoses with MS in the 90s after her left side went completely numb. Pt and caregiver states there has been a decreased amount of strength noted with bathroom transfers. Pt has fallen  in the last 6 months, 4-5 falls during transfers. Struggling with transfers especially with toilet transfer. Pt and caregiver did not like home health.  Pt accompanied by: home health aide  PERTINENT HISTORY:  -Multiple Sclerosis, 1998   PAIN:  Are you having pain? No  PRECAUTIONS: Fall  RED FLAGS: None   WEIGHT BEARING RESTRICTIONS: No  FALLS: Has patient fallen in last 6 months? Yes. Number of falls 4-5  LIVING ENVIRONMENT: Lives with: lives alone and has aide 55 hours a week Lives in: House/apartment Stairs: ramped entrance Has following equipment at home: Environmental Consultant - 2 wheeled, Wheelchair (power), Wheelchair (manual), shower chair, bed side commode, and Ramped entry  PLOF: Needs assistance with ADLs, Needs assistance with homemaking, Needs assistance with gait, and Needs assistance with transfers  PATIENT GOALS: strengthen core, be more independent with transfers, decrease the feeling of feet sticking to floor (coordination)  OBJECTIVE:  Note: Objective measures were completed at Evaluation unless otherwise noted.  DIAGNOSTIC FINDINGS: CLINICAL DATA:  Multiple sclerosis   EXAM: MRI HEAD WITHOUT AND WITH CONTRAST   MRI CERVICAL SPINE WITHOUT AND WITH CONTRAST   TECHNIQUE: Multiplanar, multiecho pulse sequences of the brain and surrounding structures, and cervical spine, to include the craniocervical junction and cervicothoracic junction, were obtained  without and with intravenous contrast.   CONTRAST:  9mL GADAVIST  GADOBUTROL  1 MMOL/ML IV SOLN   COMPARISON:  03/20/2022   FINDINGS: MRI HEAD FINDINGS   Brain: Confluent FLAIR hyperintensity in the cerebral white matter attributed to history of multiple sclerosis with premature generalized brain atrophy especially affecting the white matter with marked corpus callosum thinning. No enhancing plaque or progressive white matter disease. No evidence of superimposed mass, opportunistic infection, or interval infarction.   Vascular: Major flow voids and vascular enhancements are preserved   Skull and upper cervical spine: Normal marrow signal.   Sinuses/Orbits: Unremarkable   MRI CERVICAL SPINE FINDINGS   Alignment: Reversal of cervical lordosis.   Vertebrae: No fracture, evidence of discitis, or bone lesion.   Cord: Assessment Hendrick by motion artifact. No convincing plaque seen today. No abnormal intrathecal enhancement.   Posterior Fossa, vertebral arteries, paraspinal tissues: No perispinal mass or inflammation.   Disc levels: Focal disc space narrowing and endplate degeneration at C5-6 and C6-7 with uncovertebral spurring causing mild bilateral foraminal narrowing based on postcontrast axial images. Minor disc bulging and C6-7.   IMPRESSION: Brain MRI:   Confluent chronic demyelination with white matter atrophy. No enhancing or progressive disease when compared to 2023.   Cervical MRI:   Degraded by motion artifact with no detected plaque today. No cord thinning or abnormal enhancement.  COGNITION: Overall cognitive status: Within functional limits for tasks assessed   SENSATION: WFL  COORDINATION: UE alternating movements and heel to shin difficulty noted   POSTURE: rounded shoulders, posterior pelvic tilt, and flexed trunk   UPPER EXTREMITY MMT:  MMT Right eval Left eval  Shoulder flexion 3+, pain 3  Shoulder extension 3 3  Shoulder abduction  3+, pain 3  Shoulder adduction    Shoulder internal rotation    Shoulder external rotation    Middle trapezius    Lower trapezius    Elbow flexion 4- 4-  Elbow extension 4- 4-  Wrist flexion 3 3  Wrist extension 4- 3  Wrist ulnar deviation    Wrist radial deviation    Wrist pronation    Wrist supination    Grip strength (lbs)    (Blank rows = not tested)  LOWER EXTREMITY MMT:    MMT Right Eval Left Eval  Hip flexion 3 3  Hip extension    Hip abduction 3 3  Hip adduction 3 3  Hip internal rotation    Hip external rotation    Knee flexion 3+ 3-  Knee extension 3+ 3-  Ankle dorsiflexion 3 4+  Ankle plantarflexion    Ankle inversion    Ankle eversion    (Blank rows = not tested)  BED MOBILITY:  Findings: Sit to supine Complete Independence Supine to sit Modified independence, increased time  TRANSFERS: Sit to stand: Mod A  Assistive device utilized: None     Chair to chair: Mod A  Assistive device utilized: None       GAIT: Findings: Gait Characteristics: shuffling, trunk flexed, poor foot clearance- Right, and poor foot clearance- Left, Distance walked: 3 feet, Level of assistance: Mod A, and Comments: decreased ability to move LLE during weight bearing, mod assist required  FUNCTIONAL TESTS:  30 second chair stand test: 07/07/24: 2 WIht cueing for handplacement and reaching towards RW for stability upon standing Berg balance: 07/07/24: 18/56  PATIENT SURVEYS:  ABC scale:   ABC scale: The Activities-Specific Balance Confidence (ABC) Scale 0% 10 20 30  40 50 60 70 80 90 100% No confidence<->completely confident  "How confident are you that you will not lose your balance or become unsteady when you . . .   Date tested 07/07/24  Walk around the house 0%  2. Walk up or down stairs 0%  3. Bend over and pick up a slipper from in front of a closet floor 100% in seated position 0% in standing  4. Reach for a small can off a shelf at eye level 100% in seated  position 0% in standing  5. Stand on tip toes and reach for something above your head 0%  6. Stand on a chair and reach for something 0%  7. Sweep the floor 0%  8. Walk outside the house to a car parked in the driveway 0%  9. Get into or out of a car 20%  10. Walk across a parking lot to the mall 0%  11. Walk up or down a ramp 0%  12. Walk in a crowded mall where people rapidly walk past you 0%  13. Are bumped into by people as you walk through the mall 0%  14. Step onto or off of an escalator while you are holding onto the railing 0%  15. Step onto or off an escalator while holding onto parcels such that you cannot hold onto the railing 0%  16. Walk outside on icy sidewalks 0%  Total: #/16 20/1600= 1.25%   BERG  1. SITTING TO STANDING  2 able to stand using hands after several tries   2. STANDING UNSUPPORTED 0 unable to stand 30 seconds unsupported  Required assistance with RW, able to stand unsupported for 18  If a subject is able to stand 2 minutes unsupported, score full points for sitting unsupported. Proceed to item #4  3. SITTING WITH BACK UNSUPPORTED BUT FEET SUPPORTED ON FLOOR OR ON A STOOL 4 able to sit safely and securely for 2 minutes  4. STANDING TO SITTING 2 uses back of legs against chair to control descent  5. TRANSFERS 1 needs one person to assist  6. STANDING UNSUPPORTED WITH EYES CLOSED  3 able to stand 10 seconds with supervision  7. STANDING UNSUPPORTED WITH FEET TOGETHER 1 needs help to attain position  but able to stand 15 seconds feet together  8. REACHING FORWARD WITH OUTSTRETCHED ARM WHILE STANDING 1 reaches forward but needs supervision  9. PICK UP OBJECT FROM THE FLOOR FROM A STANDING POSITION  3 able to pick up slipper but needs supervision  10. TURNING TO LOOK BEHIND OVER LEFT AND RIGHT SHOULDERS WHILE STANDING 1 needs supervision when turning  11. TURN 360 DEGREES  0 needs assistance while turning  12. PLACE ALTERNATE FOOT ON STEP OR  STOOL WHILE STANDING UNSUPPORTED  0 needs assistance to keep from falling/unable to try Able to tap Lt foot, increased difficulty with Rt, required RW and min A for sagety 13. STANDING UNSUPPORTED ONE FOOT IN FRONT 0 loses balance while stepping or standing  14. STANDING ON ONE LEG 0 unable to try of needs assist to prevent fall   TOTAL SCORE  18/56 0 -20 = wheelchair bound                                                                                                                                     TREATMENT DATE:  07/07/24: Reviewed goals Educated importance of HEP compliance ABC survey 20/1600= 1.25% 30 second STS 2x with HHA, RW infront BERG 18/56  06/20/2024   Evaluation: -ROM measured, Strength assessed, HEP prescribed, pt educated on prognosis, findings, and importance of HEP compliance if given.       PATIENT EDUCATION: Education details: Pt was educated on findings of PT evaluation, prognosis, frequency of therapy visits and rationale, attendance policy, and HEP if given.   Person educated: Patient and Biomedical Engineer Education method: Explanation, Verbal cues, and Handouts Education comprehension: verbalized understanding, verbal cues required, tactile cues required, and needs further education  HOME EXERCISE PROGRAM: Access Code: [email or web address] URL: [email or web address] Date: 06/21/2024 Prepared by: Lang Ada  Exercises - Supine Bridge  - 1 x daily - 7 x weekly - 3 sets - 10 reps - 3 hold - Supine Active Straight Leg Raise  - 1 x daily - 7 x weekly - 3 sets - 10 reps - Seated Long Arc Quad  - 1 x daily - 7 x weekly - 3 sets - 10 reps - Neutral Curl Up with Straight Leg  - 1 x daily - 7 x weekly - 3 sets - 10 reps - 3 hold - Seated Heel Toe Raises  - 1 x daily - 7 x weekly - 3 sets - 10 reps  GOALS: Goals reviewed with patient? No  SHORT TERM GOALS: Target date: 07/12/24  Pt will be independent with HEP in order to demonstrate  participation in Physical Therapy POC.  Baseline: Goal status: INITIAL  2.  Pt will improve ABC score by 15% in order to demonstrate improved pain with functional goals and outcomes. Baseline: see objective Goal status: INITIAL  LONG TERM GOALS: Target date: 08/02/24  Pt will increase BERG balance score by > 12.5 points in  order to demonstrate improved functional safety and balance skills in ADL/mobility.   Baseline: see objective.  Goal status: INITIAL  2.  Pt will improve 30 Second Chair Stand Test by 2 reps in order to demonstrate improved functional mobility capacity in community setting.  Baseline: see objective.  Goal status: INITIAL  3.  Pt will improve ABC score by 30% in order to demonstrate improved pain with functional goals and outcomes. Baseline: see objective.  Goal status: INITIAL  4.  Pt will improve chair to chair transfer with SBA in order to demonstrate improved capacity, safety, and overall quality of movement to optimize function.  Baseline: see objective.  Goal status: INITIAL   ASSESSMENT:  CLINICAL IMPRESSION: 07/07/24: Reviewed goals and educated maximal benefits with HEP compliance, has began without questions.  Further examination complete this session including BERG and ABC survey.  Pt score indicates significant weakness and impaired balance appropriate for wheel chair bound at this stage and ABC score of 1.25% indication poor self perceived confidence with standing and balance.  Pt able complete with min A and cueing for safe mechanics with sit to stands, encouraged to feel chair behind legs and to push with hands vs reach for walker safely.  No reports of pain through session and appropriate levels of fatigue monitored through session.  Eval:  Patient is a 58 y.o. 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    Patient demonstrates decreased UE/LE strength, abnormal pain with abduction and flexion with R shoulder, impaired functional mobility and impaired balance. Patient also demonstrates difficulty with chair to chair transfer with need for mod assist for standing and initiation of LLE movement during weight bearing. Patient also demonstrates decreased standing balance with inability to stand without hand held assist. Pt modified independent with bed mobility with scooting and supine to sit and sit to supine with increased time required for movents. Patient requires education on role of PT, fall risk, increased physical activity, HEP compliance and overall safety recommendations. Patient would benefit from skilled physical therapy for increased endurance with functional mobility, increased LE/UE strength, increased independence with functional transfers and improved balance for improved gait quality, return to higher level of function with ADLs, and progress towards therapy goals.   OBJECTIVE IMPAIRMENTS: Abnormal gait, decreased activity tolerance, decreased balance, decreased coordination, decreased endurance, decreased mobility, difficulty walking, decreased strength, impaired flexibility, postural dysfunction, and pain.   ACTIVITY LIMITATIONS: carrying, lifting, bending, standing, squatting, stairs, transfers, and bed mobility  PARTICIPATION LIMITATIONS: meal prep, cleaning, laundry, shopping, community activity, and yard work  PERSONAL FACTORS: Age, Fitness, Past/current experiences, Time since onset of injury/illness/exacerbation, and 1 comorbidity: MS are also affecting patient's functional outcome.   REHAB POTENTIAL: Fair neurologic pathology  CLINICAL DECISION MAKING: Evolving/moderate complexity  EVALUATION COMPLEXITY: Moderate  PLAN:  PT FREQUENCY: 1-2x/week  PT DURATION: 6 weeks  PLANNED INTERVENTIONS: 97110-Therapeutic exercises, 97530- Therapeutic activity, [id number]- Neuromuscular  re-education, 97535- Self Care, [id number]- Manual therapy, [phone]- Gait training, Patient/Family education, Balance training, Stair training, DME instructions, Cryotherapy, and Moist heat  PLAN FOR NEXT SESSION: Progress core and extremity strengthening, progress bed mobility and functional mobility.   Augustin Mclean, LPTA/CLT; CBIS [phone]  4:53 PM, 07/07/24

## 2024-07-19 ENCOUNTER — Ambulatory Visit (HOSPITAL_COMMUNITY)

## 2024-07-19 ENCOUNTER — Encounter (HOSPITAL_COMMUNITY): Payer: Self-pay

## 2024-07-19 DIAGNOSIS — G35C0 Secondary progressive multiple sclerosis, unspecified: Secondary | ICD-10-CM | POA: Insufficient documentation

## 2024-07-19 DIAGNOSIS — R29898 Other symptoms and signs involving the musculoskeletal system: Secondary | ICD-10-CM | POA: Insufficient documentation

## 2024-07-19 DIAGNOSIS — Z7409 Other reduced mobility: Secondary | ICD-10-CM | POA: Insufficient documentation

## 2024-07-19 DIAGNOSIS — Z9181 History of falling: Secondary | ICD-10-CM | POA: Insufficient documentation

## 2024-07-19 NOTE — Therapy (Signed)
 OUTPATIENT PHYSICAL THERAPY NEURO TREATMENT   Patient Name: Erin Berg MRN: [id number] DOB:01-25-1966, 58 y.o., female Today's Date: 07/19/2024    END OF SESSION:  PT End of Session - 07/19/24 1419     Visit Number 3    Number of Visits 12    Date for Recertification  08/02/24    Authorization Type [employer] MEDICARE HMO/PPO    Authorization Time Period no auth required          Past Medical History:  Diagnosis Date   Anxiety and depression    BMI 24.0-24.9, adult    MS (multiple sclerosis)    Multiple sclerosis    Past Surgical History:  Procedure Laterality Date   COLONOSCOPY WITH PROPOFOL  N/A 12/22/2021   Procedure: COLONOSCOPY WITH PROPOFOL ;  Surgeon: Shaaron Lamar HERO, MD;  Location: [facility];  Service: Endoscopy;  Laterality: N/A;  12:30 / ASA 2   TONSILLECTOMY AND ADENOIDECTOMY     Patient Active Problem List   Diagnosis Date Noted   High risk medication use 04/16/2022   Multiple sclerosis exacerbation 03/20/2022   Hyperlipidemia 03/20/2022   Hypokalemia 03/20/2022   Pyuria 03/20/2022   Taking multiple medications for chronic disease 10/17/2018   Allergic rhinitis 10/14/2007    PCP: Bertell Satterfield, MD  REFERRING PROVIDER: Vear Charlie LABOR, MD ONSET DATE: several months  REFERRING DIAG: 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  Eval:  Pt states she was diagnoses with MS in  the 90s after her left side went completely numb. Pt and caregiver states there has been a decreased amount of strength noted with bathroom transfers. Pt has fallen in the last 6 months, 4-5 falls during transfers. Struggling with transfers especially with toilet transfer. Pt and caregiver did not like home health.  Pt accompanied by: home health aide  PERTINENT HISTORY:  -Multiple Sclerosis, 1998   PAIN:  Are you having pain? No  PRECAUTIONS: Fall  RED FLAGS: None   WEIGHT BEARING RESTRICTIONS: No  FALLS: Has patient fallen in last 6 months? Yes. Number of falls 4-5  LIVING ENVIRONMENT: Lives with: lives alone and has aide 55 hours a week Lives in: House/apartment Stairs: ramped entrance Has following equipment at home: Environmental Consultant - 2 wheeled, Wheelchair (power), Wheelchair (manual), shower chair, bed side commode, and Ramped entry  PLOF: Needs assistance with ADLs, Needs assistance with homemaking, Needs assistance with gait, and Needs assistance with transfers  PATIENT GOALS: strengthen core, be more independent with transfers, decrease the feeling of feet sticking to floor (coordination)  OBJECTIVE:  Note: Objective measures were completed at Evaluation unless otherwise noted.  DIAGNOSTIC FINDINGS: CLINICAL DATA:  Multiple sclerosis   EXAM: MRI HEAD WITHOUT AND WITH CONTRAST   MRI CERVICAL SPINE WITHOUT AND WITH CONTRAST   TECHNIQUE: Multiplanar, multiecho pulse sequences of the brain and surrounding structures, and cervical spine, to include the craniocervical junction and cervicothoracic junction, were obtained without and with intravenous contrast.   CONTRAST:  9mL GADAVIST  GADOBUTROL  1 MMOL/ML IV SOLN   COMPARISON:  03/20/2022   FINDINGS: MRI HEAD FINDINGS   Brain: Confluent FLAIR hyperintensity in the cerebral white matter attributed to history of multiple sclerosis with premature generalized brain atrophy especially affecting the white matter with marked  corpus callosum thinning. No enhancing plaque or progressive white matter disease. No evidence of superimposed mass, opportunistic infection, or interval infarction.   Vascular: Major flow voids and vascular enhancements are preserved   Skull and upper cervical spine: Normal marrow signal.   Sinuses/Orbits: Unremarkable   MRI CERVICAL SPINE FINDINGS   Alignment: Reversal of cervical lordosis.   Vertebrae: No fracture, evidence of discitis, or bone lesion.   Cord: Assessment Hendrick by motion artifact. No convincing plaque seen today. No abnormal intrathecal enhancement.   Posterior Fossa, vertebral arteries, paraspinal tissues: No perispinal mass or inflammation.   Disc levels: Focal disc space narrowing and endplate degeneration at C5-6 and C6-7 with uncovertebral spurring causing mild bilateral foraminal narrowing based on postcontrast axial images. Minor disc bulging and C6-7.   IMPRESSION: Brain MRI:   Confluent chronic demyelination with white matter atrophy. No enhancing or progressive disease when compared to 2023.   Cervical MRI:   Degraded by motion artifact with no detected plaque today. No cord thinning or abnormal enhancement.  COGNITION: Overall cognitive status: Within functional limits for tasks assessed   SENSATION: WFL  COORDINATION: UE alternating movements and heel to shin difficulty noted   POSTURE: rounded shoulders, posterior pelvic tilt, and flexed trunk   UPPER EXTREMITY MMT:  MMT Right eval Left eval  Shoulder flexion 3+, pain 3  Shoulder extension 3 3  Shoulder abduction 3+, pain 3  Shoulder adduction    Shoulder internal rotation    Shoulder external rotation    Middle trapezius    Lower trapezius    Elbow flexion 4- 4-  Elbow extension 4- 4-  Wrist flexion 3 3  Wrist extension 4- 3  Wrist ulnar deviation    Wrist radial deviation    Wrist pronation    Wrist supination    Grip strength (lbs)    (Blank rows = not  tested)  LOWER EXTREMITY MMT:    MMT Right Eval Left Eval  Hip flexion 3 3  Hip extension    Hip abduction 3 3  Hip adduction 3 3  Hip internal rotation    Hip external rotation    Knee flexion 3+ 3-  Knee extension 3+ 3-  Ankle dorsiflexion 3 4+  Ankle plantarflexion    Ankle inversion    Ankle eversion    (Blank  rows = not tested)  BED MOBILITY:  Findings: Sit to supine Complete Independence Supine to sit Modified independence, increased time  TRANSFERS: Sit to stand: Mod A  Assistive device utilized: None     Chair to chair: Mod A  Assistive device utilized: None       GAIT: Findings: Gait Characteristics: shuffling, trunk flexed, poor foot clearance- Right, and poor foot clearance- Left, Distance walked: 3 feet, Level of assistance: Mod A, and Comments: decreased ability to move LLE during weight bearing, mod assist required  FUNCTIONAL TESTS:  30 second chair stand test: 07/07/24: 2 WIht cueing for handplacement and reaching towards RW for stability upon standing Berg balance: 07/07/24: 18/56  PATIENT SURVEYS:  ABC scale:   ABC scale: The Activities-Specific Balance Confidence (ABC) Scale 0% 10 20 30  40 50 60 70 80 90 100% No confidence<->completely confident  "How confident are you that you will not lose your balance or become unsteady when you . . .   Date tested 07/07/24  Walk around the house 0%  2. Walk up or down stairs 0%  3. Bend over and pick up a slipper from in front of a closet floor 100% in seated position 0% in standing  4. Reach for a small can off a shelf at eye level 100% in seated position 0% in standing  5. Stand on tip toes and reach for something above your head 0%  6. Stand on a chair and reach for something 0%  7. Sweep the floor 0%  8. Walk outside the house to a car parked in the driveway 0%  9. Get into or out of a car 20%  10. Walk across a parking lot to the mall 0%  11. Walk up or down a ramp 0%  12. Walk in a crowded mall  where people rapidly walk past you 0%  13. Are bumped into by people as you walk through the mall 0%  14. Step onto or off of an escalator while you are holding onto the railing 0%  15. Step onto or off an escalator while holding onto parcels such that you cannot hold onto the railing 0%  16. Walk outside on icy sidewalks 0%  Total: #/16 20/1600= 1.25%   BERG  1. SITTING TO STANDING  2 able to stand using hands after several tries   2. STANDING UNSUPPORTED 0 unable to stand 30 seconds unsupported  Required assistance with RW, able to stand unsupported for 18  If a subject is able to stand 2 minutes unsupported, score full points for sitting unsupported. Proceed to item #4  3. SITTING WITH BACK UNSUPPORTED BUT FEET SUPPORTED ON FLOOR OR ON A STOOL 4 able to sit safely and securely for 2 minutes  4. STANDING TO SITTING 2 uses back of legs against chair to control descent  5. TRANSFERS 1 needs one person to assist  6. STANDING UNSUPPORTED WITH EYES CLOSED  3 able to stand 10 seconds with supervision  7. STANDING UNSUPPORTED WITH FEET TOGETHER 1 needs help to attain position but able to stand 15 seconds feet together  8. REACHING FORWARD WITH OUTSTRETCHED ARM WHILE STANDING 1 reaches forward but needs supervision  9. PICK UP OBJECT FROM THE FLOOR FROM A STANDING POSITION  3 able to pick up slipper but needs supervision  10. TURNING TO LOOK BEHIND OVER LEFT AND RIGHT SHOULDERS WHILE STANDING 1 needs supervision when turning  11. TURN 360 DEGREES  0 needs assistance while turning  12. PLACE ALTERNATE FOOT ON STEP OR STOOL WHILE STANDING UNSUPPORTED  0 needs assistance to keep from falling/unable to try Able to tap Lt foot, increased difficulty with Rt, required RW and min A for sagety 13. STANDING UNSUPPORTED ONE FOOT IN FRONT 0 loses balance while stepping or standing  14. STANDING ON ONE LEG 0 unable to try of needs assist to prevent fall   TOTAL SCORE  18/56 0 -20 =  wheelchair bound                                                                                                                                     TREATMENT DATE:  07/19/24: Transfer to mat with RW mod/max A Seated balance training: No HHA with 2 feet on floor x 30 each NO HHA with 1 foot on floor x 30 each x 2 Seated marchiung with no UE support 10x Volley ball punch forward then rotate with  10x  c/o neck pain Sitting on dynamic surface Educated importance of posture for pain control Seated posture STS with RW 3 reps standing for ~1 min  07/07/24: Reviewed goals Educated importance of HEP compliance ABC survey 20/1600= 1.25% 30 second STS 2x with HHA, RW infront BERG 18/56  06/20/2024   Evaluation: -ROM measured, Strength assessed, HEP prescribed, pt educated on prognosis, findings, and importance of HEP compliance if given.       PATIENT EDUCATION: Education details: Pt was educated on findings of PT evaluation, prognosis, frequency of therapy visits and rationale, attendance policy, and HEP if given.   Person educated: Patient and Biomedical Engineer Education method: Explanation, Verbal cues, and Handouts Education comprehension: verbalized understanding, verbal cues required, tactile cues required, and needs further education  HOME EXERCISE PROGRAM: Access Code: [email or web address] URL: [email or web address] Date: 06/21/2024 Prepared by: Lang Ada  Exercises - Supine Bridge  - 1 x daily - 7 x weekly - 3 sets - 10 reps - 3 hold - Supine Active Straight Leg Raise  - 1 x daily - 7 x weekly - 3 sets - 10 reps - Seated Long Arc Quad  - 1 x daily - 7 x weekly - 3 sets - 10 reps - Neutral Curl Up with Straight Leg  - 1 x daily - 7 x weekly - 3 sets - 10 reps - 3 hold - Seated Heel Toe Raises  - 1 x daily - 7 x weekly - 3 sets - 10 reps  GOALS: Goals reviewed with patient? No  SHORT TERM GOALS: Target date: 07/12/24  Pt will be independent with HEP in  order to demonstrate participation in Physical Therapy POC.  Baseline: Goal status: INITIAL  2.  Pt will improve ABC score by 15% in order to demonstrate improved pain with functional goals and outcomes. Baseline: see objective Goal status: INITIAL  LONG TERM GOALS: Target date: 08/02/24  Pt will increase BERG balance score by > 12.5 points  in order to demonstrate improved functional safety and balance skills in ADL/mobility.   Baseline: see objective.  Goal status: INITIAL  2.  Pt will improve 30 Second Chair Stand Test by 2 reps in order to demonstrate improved functional mobility capacity in community setting.  Baseline: see objective.  Goal status: INITIAL  3.  Pt will improve ABC score by 30% in order to demonstrate improved pain with functional goals and outcomes. Baseline: see objective.  Goal status: INITIAL  4.  Pt will improve chair to chair transfer with SBA in order to demonstrate improved capacity, safety, and overall quality of movement to optimize function.  Baseline: see objective.  Goal status: INITIAL   ASSESSMENT:  CLINICAL IMPRESSION: 07/19/24:   Session focus iwht transfer training and proximal strengthening.  Used RW for transfer training today with cueing for hand and foot placement to assist, mod A.  Pt able to stand for at least a minute with min A prior fatigue and need to sit.  Pt presents with significant weakness in trunk and proximal musculature.  Added seated balance activities with min A to reduce posterior lean and cueing for abdominal contraction.  Increased difficulty with Lt LE transfering back to WC at EOS.  Pt reports increased fatigue to 8/10 at EOS.  Encouraged to speak up and keep fatigue levels below 5/10.    Eval:  Patient is a 58 y.o. 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   Patient demonstrates  decreased UE/LE strength, abnormal pain with abduction and flexion with R shoulder, impaired functional mobility and impaired balance. Patient also demonstrates difficulty with chair to chair transfer with need for mod assist for standing and initiation of LLE movement during weight bearing. Patient also demonstrates decreased standing balance with inability to stand without hand held assist. Pt modified independent with bed mobility with scooting and supine to sit and sit to supine with increased time required for movents. Patient requires education on role of PT, fall risk, increased physical activity, HEP compliance and overall safety recommendations. Patient would benefit from skilled physical therapy for increased endurance with functional mobility, increased LE/UE strength, increased independence with functional transfers and improved balance for improved gait quality, return to higher level of function with ADLs, and progress towards therapy goals.   OBJECTIVE IMPAIRMENTS: Abnormal gait, decreased activity tolerance, decreased balance, decreased coordination, decreased endurance, decreased mobility, difficulty walking, decreased strength, impaired flexibility, postural dysfunction, and pain.   ACTIVITY LIMITATIONS: carrying, lifting, bending, standing, squatting, stairs, transfers, and bed mobility  PARTICIPATION LIMITATIONS: meal prep, cleaning, laundry, shopping, community activity, and yard work  PERSONAL FACTORS: Age, Fitness, Past/current experiences, Time since onset of injury/illness/exacerbation, and 1 comorbidity: MS are also affecting patient's functional outcome.   REHAB POTENTIAL: Fair neurologic pathology  CLINICAL DECISION MAKING: Evolving/moderate complexity  EVALUATION COMPLEXITY: Moderate  PLAN:  PT FREQUENCY: 1-2x/week  PT DURATION: 6 weeks  PLANNED INTERVENTIONS: 97110-Therapeutic exercises, 97530- Therapeutic activity, [id number]- Neuromuscular re-education, 97535- Self  Care, [id number]- Manual therapy, [phone]- Gait training, Patient/Family education, Balance training, Stair training, DME instructions, Cryotherapy, and Moist heat  PLAN FOR NEXT SESSION: Progress core and extremity strengthening, progress bed mobility and functional mobility.   Augustin Mclean, LPTA/CLT; CBIS [phone]  2:20 PM, 07/19/24

## 2024-07-21 ENCOUNTER — Encounter (HOSPITAL_COMMUNITY): Payer: Self-pay

## 2024-07-21 ENCOUNTER — Ambulatory Visit (HOSPITAL_COMMUNITY)

## 2024-07-21 DIAGNOSIS — R29898 Other symptoms and signs involving the musculoskeletal system: Secondary | ICD-10-CM

## 2024-07-21 DIAGNOSIS — G35C0 Secondary progressive multiple sclerosis, unspecified: Secondary | ICD-10-CM

## 2024-07-21 DIAGNOSIS — Z7409 Other reduced mobility: Secondary | ICD-10-CM

## 2024-07-21 DIAGNOSIS — Z9181 History of falling: Secondary | ICD-10-CM

## 2024-07-21 NOTE — Therapy (Signed)
 OUTPATIENT PHYSICAL THERAPY NEURO TREATMENT   Patient Name: Erin Berg MRN: [id number] DOB:1966-04-23, 58 y.o., female Today's Date: 07/21/2024    END OF SESSION:  PT End of Session - 07/21/24 1415     Visit Number 4    Number of Visits 12    Date for Recertification  08/02/24    Authorization Type [employer] MEDICARE HMO/PPO    Authorization Time Period no auth required    Progress Note Due on Visit 10    PT Start Time 1416    PT Stop Time 1455    PT Time Calculation (min) 39 min    Equipment Utilized During Treatment Gait belt    Activity Tolerance Patient limited by fatigue;Patient tolerated treatment well    Behavior During Therapy WFL for tasks assessed/performed           Past Medical History:  Diagnosis Date   Anxiety and depression    BMI 24.0-24.9, adult    MS (multiple sclerosis)    Multiple sclerosis    Past Surgical History:  Procedure Laterality Date   COLONOSCOPY WITH PROPOFOL  N/A 12/22/2021   Procedure: COLONOSCOPY WITH PROPOFOL ;  Surgeon: Shaaron Lamar HERO, MD;  Location: [facility];  Service: Endoscopy;  Laterality: N/A;  12:30 / ASA 2   TONSILLECTOMY AND ADENOIDECTOMY     Patient Active Problem List   Diagnosis Date Noted   High risk medication use 04/16/2022   Multiple sclerosis exacerbation 03/20/2022   Hyperlipidemia 03/20/2022   Hypokalemia 03/20/2022   Pyuria 03/20/2022   Taking multiple medications for chronic disease 10/17/2018   Allergic rhinitis 10/14/2007    PCP: Bertell Satterfield, MD  REFERRING PROVIDER: Vear Charlie LABOR, MD ONSET DATE: several months  REFERRING DIAG: 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 Pt states she is still wanting to work on core muscles and transfers.  Eval:  Pt states she was diagnoses with MS in the 90s after her left side went completely numb. Pt and caregiver states there has been a decreased amount of strength noted with bathroom transfers. Pt has fallen in the last 6  months, 4-5 falls during transfers. Struggling with transfers especially with toilet transfer. Pt and caregiver did not like home health.  Pt accompanied by: home health aide  PERTINENT HISTORY:  -Multiple Sclerosis, 1998   PAIN:  Are you having pain? No  PRECAUTIONS: Fall  RED FLAGS: None   WEIGHT BEARING RESTRICTIONS: No  FALLS: Has patient fallen in last 6 months? Yes. Number of falls 4-5  LIVING ENVIRONMENT: Lives with: lives alone and has aide 55 hours a week Lives in: House/apartment Stairs: ramped entrance Has following equipment at home: Environmental Consultant - 2 wheeled, Wheelchair (power), Wheelchair (manual), shower chair, bed side commode, and Ramped entry  PLOF: Needs assistance with ADLs, Needs assistance with homemaking, Needs assistance with gait, and Needs assistance with transfers  PATIENT GOALS: strengthen core, be more independent with transfers, decrease the feeling of feet sticking to floor (coordination)  OBJECTIVE:  Note: Objective measures were completed at Evaluation unless otherwise noted.  DIAGNOSTIC FINDINGS: CLINICAL DATA:  Multiple sclerosis   EXAM: MRI HEAD WITHOUT AND WITH CONTRAST   MRI CERVICAL SPINE WITHOUT AND WITH CONTRAST   TECHNIQUE: Multiplanar, multiecho pulse sequences of the brain and surrounding structures, and cervical spine, to include the craniocervical junction and cervicothoracic junction, were obtained  without and with intravenous contrast.   CONTRAST:  9mL GADAVIST  GADOBUTROL  1 MMOL/ML IV SOLN   COMPARISON:  03/20/2022   FINDINGS: MRI HEAD FINDINGS   Brain: Confluent FLAIR hyperintensity in the cerebral white matter attributed to history of multiple sclerosis with premature generalized brain atrophy especially affecting the white matter with marked corpus callosum thinning. No enhancing plaque or progressive white matter disease. No evidence of superimposed mass, opportunistic infection, or interval infarction.   Vascular: Major flow voids and vascular enhancements are preserved   Skull and upper cervical spine: Normal marrow signal.   Sinuses/Orbits: Unremarkable   MRI CERVICAL SPINE FINDINGS   Alignment: Reversal of cervical lordosis.   Vertebrae: No fracture, evidence of discitis, or bone lesion.   Cord: Assessment Hendrick by motion artifact. No convincing plaque seen today. No abnormal intrathecal enhancement.   Posterior Fossa, vertebral arteries, paraspinal tissues: No perispinal mass or inflammation.   Disc levels: Focal disc space narrowing and endplate degeneration at C5-6 and C6-7 with uncovertebral spurring causing mild bilateral foraminal narrowing based on postcontrast axial images. Minor disc bulging and C6-7.   IMPRESSION: Brain MRI:   Confluent chronic demyelination with white matter atrophy. No enhancing or progressive disease when compared to 2023.   Cervical MRI:   Degraded by motion artifact with no detected plaque today. No cord thinning or abnormal enhancement.  COGNITION: Overall cognitive status: Within functional limits for tasks assessed   SENSATION: WFL  COORDINATION: UE alternating movements and heel to shin difficulty noted   POSTURE: rounded shoulders, posterior pelvic tilt, and flexed trunk   UPPER EXTREMITY MMT:  MMT Right eval Left eval  Shoulder flexion 3+, pain 3  Shoulder extension 3 3  Shoulder abduction  3+, pain 3  Shoulder adduction    Shoulder internal rotation    Shoulder external rotation    Middle trapezius    Lower trapezius    Elbow flexion 4- 4-  Elbow extension 4- 4-  Wrist flexion 3 3  Wrist extension 4- 3  Wrist ulnar deviation    Wrist radial deviation    Wrist pronation    Wrist supination    Grip strength (lbs)    (Blank rows = not tested)  LOWER EXTREMITY MMT:  MMT Right Eval Left Eval  Hip flexion 3 3  Hip extension    Hip abduction 3 3  Hip adduction 3 3  Hip internal rotation    Hip external rotation    Knee flexion 3+ 3-  Knee extension 3+ 3-  Ankle dorsiflexion 3 4+  Ankle plantarflexion    Ankle inversion    Ankle eversion    (Blank rows = not tested)  BED MOBILITY:  Findings: Sit to supine Complete Independence Supine to sit Modified independence, increased time  TRANSFERS: Sit to stand: Mod A  Assistive device utilized: None     Chair to chair: Mod A  Assistive device utilized: None       GAIT: Findings: Gait Characteristics: shuffling, trunk flexed, poor foot clearance- Right, and poor foot clearance- Left, Distance walked: 3 feet, Level of assistance: Mod A, and Comments: decreased ability to move LLE during weight bearing, mod assist required  FUNCTIONAL TESTS:  30 second chair stand test: 07/07/24: 2 WIht cueing for handplacement and reaching towards RW for stability upon standing Berg balance: 07/07/24: 18/56  PATIENT SURVEYS:  ABC scale:   ABC scale: The Activities-Specific Balance Confidence (ABC) Scale 0% 10 20 30  40 50 60 70 80 90 100% No confidence<->completely confident  "How confident are you that you will not lose your balance or become unsteady when you . . .   Date tested 07/07/24  Walk around the house 0%  2. Walk up or down stairs 0%  3. Bend over and pick up a slipper from in front of a closet floor 100% in seated position 0% in standing  4. Reach for a small can off a shelf at eye level 100% in seated  position 0% in standing  5. Stand on tip toes and reach for something above your head 0%  6. Stand on a chair and reach for something 0%  7. Sweep the floor 0%  8. Walk outside the house to a car parked in the driveway 0%  9. Get into or out of a car 20%  10. Walk across a parking lot to the mall 0%  11. Walk up or down a ramp 0%  12. Walk in a crowded mall where people rapidly walk past you 0%  13. Are bumped into by people as you walk through the mall 0%  14. Step onto or off of an escalator while you are holding onto the railing 0%  15. Step onto or off an escalator while holding onto parcels such that you cannot hold onto the railing 0%  16. Walk outside on icy sidewalks 0%  Total: #/16 20/1600= 1.25%   BERG  1. SITTING TO STANDING  2 able to stand using hands after several tries   2. STANDING UNSUPPORTED 0 unable to stand 30 seconds unsupported  Required assistance with RW, able to stand unsupported for 18  If a subject is able to stand 2 minutes unsupported, score full points for sitting unsupported. Proceed to item #4  3. SITTING WITH BACK UNSUPPORTED BUT FEET SUPPORTED ON FLOOR OR ON A STOOL 4 able to sit safely and securely for 2 minutes  4. STANDING TO SITTING 2 uses back of legs against chair to control descent  5. TRANSFERS 1 needs one person to assist  6. STANDING UNSUPPORTED WITH EYES CLOSED  3 able to stand 10 seconds with supervision  7. STANDING UNSUPPORTED WITH FEET TOGETHER 1 needs help to attain position but able to stand 15 seconds  feet together  8. REACHING FORWARD WITH OUTSTRETCHED ARM WHILE STANDING 1 reaches forward but needs supervision  9. PICK UP OBJECT FROM THE FLOOR FROM A STANDING POSITION  3 able to pick up slipper but needs supervision  10. TURNING TO LOOK BEHIND OVER LEFT AND RIGHT SHOULDERS WHILE STANDING 1 needs supervision when turning  11. TURN 360 DEGREES  0 needs assistance while turning  12. PLACE ALTERNATE FOOT ON STEP OR  STOOL WHILE STANDING UNSUPPORTED  0 needs assistance to keep from falling/unable to try Able to tap Lt foot, increased difficulty with Rt, required RW and min A for sagety 13. STANDING UNSUPPORTED ONE FOOT IN FRONT 0 loses balance while stepping or standing  14. STANDING ON ONE LEG 0 unable to try of needs assist to prevent fall   TOTAL SCORE  18/56 0 -20 = wheelchair bound                                                                                                                                     TREATMENT DATE:  07/21/2024  Therapeutic Exercise: -Supine bridges, 2 sets of 5 reps, 5 second holds, pt cued for max hip extension Neuromuscular Re-education: -Standing balance: 30 seconds   -Wide BOS, normal BOS, semi tandem bilateral Therapeutic Activity: -Walking laps in parallel bars, 2 laps, pt requires BUE support -Lateral step up and overs, 2 inch step, 4 inch step, 1 lap in parallel bars -Step up and overs, 2 inch step, 4 inch step, 1 lap in parallel bars -Sit to stands, 2 sets of 4 reps, pt cued for core activation and keeping weight close to chest   07/19/24: Transfer to mat with RW mod/max A Seated balance training: No HHA with 2 feet on floor x 30 each NO HHA with 1 foot on floor x 30 each x 2 Seated marchiung with no UE support 10x Volley ball punch forward then rotate with  10x  c/o neck pain Sitting on dynamic surface Educated importance of posture for pain control Seated posture STS with RW 3 reps standing for ~1 min  07/07/24: Reviewed goals Educated importance of HEP compliance ABC survey 20/1600= 1.25% 30 second STS 2x with HHA, RW infront BERG 18/56        PATIENT EDUCATION: Education details: Pt was educated on findings of PT evaluation, prognosis, frequency of therapy visits and rationale, attendance policy, and HEP if given.   Person educated: Patient and Biomedical Engineer Education method: Explanation, Verbal cues, and  Handouts Education comprehension: verbalized understanding, verbal cues required, tactile cues required, and needs further education  HOME EXERCISE PROGRAM: Access Code: [email or web address] URL: [email or web address] Date: 06/21/2024 Prepared by: Lang Ada  Exercises - Supine Bridge  - 1 x daily - 7 x weekly - 3 sets - 10 reps - 3 hold - Supine Active Straight Leg Raise  - 1 x daily - 7 x weekly - 3 sets -  10 reps - Seated Long Arc Quad  - 1 x daily - 7 x weekly - 3 sets - 10 reps - Neutral Curl Up with Straight Leg  - 1 x daily - 7 x weekly - 3 sets - 10 reps - 3 hold - Seated Heel Toe Raises  - 1 x daily - 7 x weekly - 3 sets - 10 reps  GOALS: Goals reviewed with patient? No  SHORT TERM GOALS: Target date: 07/12/24  Pt will be independent with HEP in order to demonstrate participation in Physical Therapy POC.  Baseline: Goal status: INITIAL  2.  Pt will improve ABC score by 15% in order to demonstrate improved pain with functional goals and outcomes. Baseline: see objective Goal status: INITIAL  LONG TERM GOALS: Target date: 08/02/24  Pt will increase BERG balance score by > 12.5 points in order to demonstrate improved functional safety and balance skills in ADL/mobility.   Baseline: see objective.  Goal status: INITIAL  2.  Pt will improve 30 Second Chair Stand Test by 2 reps in order to demonstrate improved functional mobility capacity in community setting.  Baseline: see objective.  Goal status: INITIAL  3.  Pt will improve ABC score by 30% in order to demonstrate improved pain with functional goals and outcomes. Baseline: see objective.  Goal status: INITIAL  4.  Pt will improve chair to chair transfer with SBA in order to demonstrate improved capacity, safety, and overall quality of movement to optimize function.  Baseline: see objective.  Goal status: INITIAL   ASSESSMENT:  CLINICAL IMPRESSION: Patient continues to demonstrate decreased LE strength,  decreased gait quality and balance. Patient does demonstrate significant improvements with gait training in parallel bars only requiring min assist upon occasion even with turns. Patient able to progress dynamic balance and core activation exercises today with lateral step up and overs and sit to stands, good performance with verbal cueing. Pt does complain of some neck pain this date although able to remain on task and complete all requested activities. Patient would continue to benefit from skilled physical therapy for increased endurance with ambulation, increased LE strength, and improved balance for improved quality of life, improved independence with gait training and continued progress towards therapy goals.   Eval:  Patient is a 58 y.o. 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 Patient demonstrates decreased UE/LE strength, abnormal pain with abduction and flexion with R shoulder, impaired functional mobility and impaired balance. Patient also demonstrates difficulty with chair to chair transfer with need for mod assist for standing and initiation of LLE movement during weight bearing. Patient also demonstrates decreased standing balance with inability to stand without hand held assist. Pt modified independent with bed mobility with scooting and supine to sit and sit to supine with increased time required for movents. Patient requires education on role of PT, fall risk, increased physical activity, HEP compliance and overall safety recommendations. Patient would benefit from skilled physical therapy for increased endurance with functional mobility, increased LE/UE strength, increased independence with functional transfers and improved balance for improved gait quality, return to higher level of function with ADLs, and progress towards therapy goals.   OBJECTIVE  IMPAIRMENTS: Abnormal gait, decreased activity tolerance, decreased balance, decreased coordination, decreased endurance, decreased mobility, difficulty walking, decreased strength, impaired flexibility, postural dysfunction, and pain.   ACTIVITY LIMITATIONS: carrying, lifting, bending, standing, squatting, stairs, transfers, and bed mobility  PARTICIPATION LIMITATIONS: meal prep, cleaning, laundry, shopping, community activity, and yard work  PERSONAL FACTORS: Age, Fitness, Past/current experiences, Time since onset of injury/illness/exacerbation, and 1 comorbidity: MS are also affecting patient's functional outcome.   REHAB POTENTIAL: Fair neurologic pathology  CLINICAL DECISION MAKING: Evolving/moderate complexity  EVALUATION COMPLEXITY: Moderate  PLAN:  PT FREQUENCY: 1-2x/week  PT DURATION: 6 weeks  PLANNED INTERVENTIONS: 97110-Therapeutic exercises, 97530- Therapeutic activity, [id number]- Neuromuscular re-education, 97535- Self Care, [id number]- Manual therapy, [phone]- Gait training, Patient/Family education, Balance training, Stair training, DME instructions, Cryotherapy, and Moist heat  PLAN FOR NEXT SESSION: Progress core and extremity strengthening, progress bed mobility and functional mobility.   Vishwa Dais, PT, DPT [facility] Office: [phone] 4:40 PM, 07/21/24

## 2024-07-26 ENCOUNTER — Ambulatory Visit (HOSPITAL_COMMUNITY)

## 2024-07-26 ENCOUNTER — Encounter (HOSPITAL_COMMUNITY): Payer: Self-pay

## 2024-07-26 DIAGNOSIS — G35C0 Secondary progressive multiple sclerosis, unspecified: Secondary | ICD-10-CM

## 2024-07-26 DIAGNOSIS — R29898 Other symptoms and signs involving the musculoskeletal system: Secondary | ICD-10-CM

## 2024-07-26 DIAGNOSIS — Z7409 Other reduced mobility: Secondary | ICD-10-CM | POA: Diagnosis not present

## 2024-07-26 DIAGNOSIS — Z9181 History of falling: Secondary | ICD-10-CM

## 2024-07-26 NOTE — Therapy (Signed)
 OUTPATIENT PHYSICAL THERAPY NEURO TREATMENT   Patient Name: Erin Berg MRN: [id number] DOB:[birth year], 58 y.o., female Today's Date: 07/26/2024    END OF SESSION:  PT End of Session - 07/26/24 1420     Visit Number 5    Number of Visits 12    Date for Recertification  08/02/24    Authorization Type [employer] MEDICARE HMO/PPO    Authorization Time Period no auth required    Progress Note Due on Visit 10    PT Start Time 1420    PT Stop Time 1458    PT Time Calculation (min) 38 min    Equipment Utilized During Treatment Gait belt    Activity Tolerance Patient limited by fatigue;Patient tolerated treatment well    Behavior During Therapy WFL for tasks assessed/performed            Past Medical History:  Diagnosis Date   Anxiety and depression    BMI 24.0-24.9, adult    MS (multiple sclerosis)    Multiple sclerosis    Past Surgical History:  Procedure Laterality Date   COLONOSCOPY WITH PROPOFOL  N/A 12/22/2021   Procedure: COLONOSCOPY WITH PROPOFOL ;  Surgeon: Shaaron Lamar HERO, MD;  Location: [facility];  Service: Endoscopy;  Laterality: N/A;  12:30 / ASA 2   TONSILLECTOMY AND ADENOIDECTOMY     Patient Active Problem List   Diagnosis Date Noted   High risk medication use 04/16/2022   Multiple sclerosis exacerbation 03/20/2022   Hyperlipidemia 03/20/2022   Hypokalemia 03/20/2022   Pyuria 03/20/2022   Taking multiple medications for chronic disease 10/17/2018   Allergic rhinitis 10/14/2007    PCP: Bertell Satterfield, MD  REFERRING PROVIDER: Vear Charlie LABOR, MD ONSET DATE: several months  REFERRING DIAG: 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 Pt states she is still dealing with left knee stability issues.  Eval:  Pt states she was diagnoses with MS in the 90s after her left side went completely numb. Pt and caregiver states there has been a decreased amount of strength noted with bathroom transfers. Pt has fallen in the last 6 months,  4-5 falls during transfers. Struggling with transfers especially with toilet transfer. Pt and caregiver did not like home health.  Pt accompanied by: home health aide  PERTINENT HISTORY:  -Multiple Sclerosis, 1998   PAIN:  Are you having pain? No  PRECAUTIONS: Fall  RED FLAGS: None   WEIGHT BEARING RESTRICTIONS: No  FALLS: Has patient fallen in last 6 months? Yes. Number of falls 4-5  LIVING ENVIRONMENT: Lives with: lives alone and has aide 55 hours a week Lives in: House/apartment Stairs: ramped entrance Has following equipment at home: Environmental Consultant - 2 wheeled, Wheelchair (power), Wheelchair (manual), shower chair, bed side commode, and Ramped entry  PLOF: Needs assistance with ADLs, Needs assistance with homemaking, Needs assistance with gait, and Needs assistance with transfers  PATIENT GOALS: strengthen core, be more independent with transfers, decrease the feeling of feet sticking to floor (coordination)  OBJECTIVE:  Note: Objective measures were completed at Evaluation unless otherwise noted.  DIAGNOSTIC FINDINGS: CLINICAL DATA:  Multiple sclerosis   EXAM: MRI HEAD WITHOUT AND WITH CONTRAST   MRI CERVICAL SPINE WITHOUT AND WITH CONTRAST   TECHNIQUE: Multiplanar, multiecho pulse sequences of the brain and surrounding structures, and cervical spine, to include the craniocervical junction and cervicothoracic junction, were obtained without  and with intravenous contrast.   CONTRAST:  9mL GADAVIST  GADOBUTROL  1 MMOL/ML IV SOLN   COMPARISON:  03/20/2022   FINDINGS: MRI HEAD FINDINGS   Brain: Confluent FLAIR hyperintensity in the cerebral white matter attributed to history of multiple sclerosis with premature generalized brain atrophy especially affecting the white matter with marked corpus callosum thinning. No enhancing plaque or progressive white matter disease. No evidence of superimposed mass, opportunistic infection, or interval infarction.   Vascular: Major flow voids and vascular enhancements are preserved   Skull and upper cervical spine: Normal marrow signal.   Sinuses/Orbits: Unremarkable   MRI CERVICAL SPINE FINDINGS   Alignment: Reversal of cervical lordosis.   Vertebrae: No fracture, evidence of discitis, or bone lesion.   Cord: Assessment Hendrick by motion artifact. No convincing plaque seen today. No abnormal intrathecal enhancement.   Posterior Fossa, vertebral arteries, paraspinal tissues: No perispinal mass or inflammation.   Disc levels: Focal disc space narrowing and endplate degeneration at C5-6 and C6-7 with uncovertebral spurring causing mild bilateral foraminal narrowing based on postcontrast axial images. Minor disc bulging and C6-7.   IMPRESSION: Brain MRI:   Confluent chronic demyelination with white matter atrophy. No enhancing or progressive disease when compared to 2023.   Cervical MRI:   Degraded by motion artifact with no detected plaque today. No cord thinning or abnormal enhancement.  COGNITION: Overall cognitive status: Within functional limits for tasks assessed   SENSATION: WFL  COORDINATION: UE alternating movements and heel to shin difficulty noted   POSTURE: rounded shoulders, posterior pelvic tilt, and flexed trunk   UPPER EXTREMITY MMT:  MMT Right eval Left eval  Shoulder flexion 3+, pain 3  Shoulder extension 3 3  Shoulder abduction 3+, pain 3   Shoulder adduction    Shoulder internal rotation    Shoulder external rotation    Middle trapezius    Lower trapezius    Elbow flexion 4- 4-  Elbow extension 4- 4-  Wrist flexion 3 3  Wrist extension 4- 3  Wrist ulnar deviation    Wrist radial deviation    Wrist pronation    Wrist supination    Grip strength (lbs)    (Blank rows = not tested)  LOWER EXTREMITY MMT:  MMT Right Eval Left Eval  Hip flexion 3 3  Hip extension    Hip abduction 3 3  Hip adduction 3 3  Hip internal rotation    Hip external rotation    Knee flexion 3+ 3-  Knee extension 3+ 3-  Ankle dorsiflexion 3 4+  Ankle plantarflexion    Ankle inversion    Ankle eversion    (Blank rows = not tested)  BED MOBILITY:  Findings: Sit to supine Complete Independence Supine to sit Modified independence, increased time  TRANSFERS: Sit to stand: Mod A  Assistive device utilized: None     Chair to chair: Mod A  Assistive device utilized: None       GAIT: Findings: Gait Characteristics: shuffling, trunk flexed, poor foot clearance- Right, and poor foot clearance- Left, Distance walked: 3 feet, Level of assistance: Mod A, and Comments: decreased ability to move LLE during weight bearing, mod assist required  FUNCTIONAL TESTS:  30 second chair stand test: 07/07/24: 2 WIht cueing for handplacement and reaching towards RW for stability upon standing Berg balance: 07/07/24: 18/56  PATIENT SURVEYS:  ABC scale:   ABC scale: The Activities-Specific Balance Confidence (ABC) Scale 0% 10 20 30  40 50 60 70 80 90 100% No confidence<->completely confident  How confident are you that you will not lose your balance or become unsteady when you . . .   Date tested 07/07/24  Walk around the house 0%  2. Walk up or down stairs 0%  3. Bend over and pick up a slipper from in front of a closet floor 100% in seated position 0% in standing  4. Reach for a small can off a shelf at eye level 100% in seated position 0% in  standing  5. Stand on tip toes and reach for something above your head 0%  6. Stand on a chair and reach for something 0%  7. Sweep the floor 0%  8. Walk outside the house to a car parked in the driveway 0%  9. Get into or out of a car 20%  10. Walk across a parking lot to the mall 0%  11. Walk up or down a ramp 0%  12. Walk in a crowded mall where people rapidly walk past you 0%  13. Are bumped into by people as you walk through the mall 0%  14. Step onto or off of an escalator while you are holding onto the railing 0%  15. Step onto or off an escalator while holding onto parcels such that you cannot hold onto the railing 0%  16. Walk outside on icy sidewalks 0%  Total: #/16 20/1600= 1.25%   BERG  1. SITTING TO STANDING  2 able to stand using hands after several tries   2. STANDING UNSUPPORTED 0 unable to stand 30 seconds unsupported  Required assistance with RW, able to stand unsupported for 18  If a subject is able to stand 2 minutes unsupported, score full points for sitting unsupported. Proceed to item #4  3. SITTING WITH BACK UNSUPPORTED BUT FEET SUPPORTED ON FLOOR OR ON A STOOL 4 able to sit safely and securely for 2 minutes  4. STANDING TO SITTING 2 uses back of legs against chair to control descent  5. TRANSFERS 1 needs one person to assist  6. STANDING UNSUPPORTED WITH EYES CLOSED  3 able to stand 10 seconds with supervision  7. STANDING UNSUPPORTED WITH FEET TOGETHER 1 needs help to attain position but able to stand 15 seconds  feet together  8. REACHING FORWARD WITH OUTSTRETCHED ARM WHILE STANDING 1 reaches forward but needs supervision  9. PICK UP OBJECT FROM THE FLOOR FROM A STANDING POSITION  3 able to pick up slipper but needs supervision  10. TURNING TO LOOK BEHIND OVER LEFT AND RIGHT SHOULDERS WHILE STANDING 1 needs supervision when turning  11. TURN 360 DEGREES  0 needs assistance while turning  12. PLACE ALTERNATE FOOT ON STEP OR STOOL WHILE  STANDING UNSUPPORTED  0 needs assistance to keep from falling/unable to try Able to tap Lt foot, increased difficulty with Rt, required RW and min A for sagety 13. STANDING UNSUPPORTED ONE FOOT IN FRONT 0 loses balance while stepping or standing  14. STANDING ON ONE LEG 0 unable to try of needs assist to prevent fall   TOTAL SCORE  18/56 0 -20 = wheelchair bound                                                                                                                                     TREATMENT DATE:  07/21/2024  Therapeutic Exercise: -Heel raises, 2 sets of 10 reps, pt cued for increased bilateral ankle ROM Neuromuscular Re-education: -Standing balance: 60 seconds   -only one UE support, bilateral Therapeutic Activity: -Walking laps in parallel bars, 2 laps, pt requires BUE support, increased L knee locking out this date -Standing marches, LLE only due to LLE locking out uncontrollably when lifting RLE, 2 sets of 10 reps. -LTKE, 4 inch step, 2 sets of 5 reps, pt cued for decreased knee locking -Sit to stands, 2 sets of 4 reps, pt cued for core activation and keeping weight close to chest -Mat table to chair transfer, x2, pt cued for decreased plopping, sequencing and requires mod assist for ambulation -Bed mobility on mat table rolling, x2 and supine to sit, pt cued for sequencing   07/19/24: Transfer to mat with RW mod/max A Seated balance training: No HHA with 2 feet on floor x 30 each NO HHA with 1 foot on floor x 30 each x 2 Seated marchiung with no UE support 10x Volley ball punch forward then rotate with  10x  c/o neck pain Sitting on dynamic surface Educated importance of posture for pain control Seated posture STS with RW 3 reps standing for ~1 min  07/07/24: Reviewed goals Educated importance of HEP compliance ABC survey 20/1600= 1.25% 30 second STS 2x with HHA, RW infront BERG 18/56        PATIENT EDUCATION: Education details: Pt was  educated on findings of PT evaluation, prognosis, frequency of therapy visits and rationale, attendance policy, and HEP if given.   Person educated: Patient and Biomedical Engineer Education method: Explanation, Verbal cues, and Handouts Education comprehension: verbalized understanding, verbal cues required, tactile cues required, and needs further education  HOME EXERCISE PROGRAM: Access Code: [email or web address] URL: [email or web address] Date: 06/21/2024 Prepared by: Lang Ada  Exercises - Supine  Bridge  - 1 x daily - 7 x weekly - 3 sets - 10 reps - 3 hold - Supine Active Straight Leg Raise  - 1 x daily - 7 x weekly - 3 sets - 10 reps - Seated Long Arc Quad  - 1 x daily - 7 x weekly - 3 sets - 10 reps - Neutral Curl Up with Straight Leg  - 1 x daily - 7 x weekly - 3 sets - 10 reps - 3 hold - Seated Heel Toe Raises  - 1 x daily - 7 x weekly - 3 sets - 10 reps  GOALS: Goals reviewed with patient? No  SHORT TERM GOALS: Target date: 07/12/24  Pt will be independent with HEP in order to demonstrate participation in Physical Therapy POC.  Baseline: Goal status: INITIAL  2.  Pt will improve ABC score by 15% in order to demonstrate improved pain with functional goals and outcomes. Baseline: see objective Goal status: INITIAL  LONG TERM GOALS: Target date: 08/02/24  Pt will increase BERG balance score by > 12.5 points in order to demonstrate improved functional safety and balance skills in ADL/mobility.   Baseline: see objective.  Goal status: INITIAL  2.  Pt will improve 30 Second Chair Stand Test by 2 reps in order to demonstrate improved functional mobility capacity in community setting.  Baseline: see objective.  Goal status: INITIAL  3.  Pt will improve ABC score by 30% in order to demonstrate improved pain with functional goals and outcomes. Baseline: see objective.  Goal status: INITIAL  4.  Pt will improve chair to chair transfer with SBA in order to demonstrate  improved capacity, safety, and overall quality of movement to optimize function.  Baseline: see objective.  Goal status: INITIAL   ASSESSMENT:  CLINICAL IMPRESSION: Patient continues to demonstrate decreased LE strength (left worse than right), decreased gait quality and balance. Patient does demonstrate continued improvements with gait training in parallel bars but does have abnormal L knee locking. Patient able to progress dynamic balance and core activation exercises today with total knee extensions on LLE and bed mobility rolling, good performance with verbal cueing. Pt does complain of fatigue this date although able to remain on task and complete all requested activities. Patient would continue to benefit from skilled physical therapy for increased endurance with ambulation, increased LE strength, and improved balance for improved quality of life, improved independence with gait training and continued progress towards therapy goals.   Eval:  Patient is a 58 y.o. 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 Patient demonstrates decreased UE/LE strength, abnormal pain with abduction and flexion with R shoulder, impaired functional mobility and impaired balance. Patient also demonstrates difficulty with chair to chair transfer with need for mod assist for standing and initiation of LLE movement during weight bearing. Patient also demonstrates decreased standing balance with inability to stand without hand held assist. Pt modified independent with bed mobility with scooting and supine to sit and sit to supine with increased time required for movents. Patient requires education on role of PT, fall risk, increased physical activity, HEP compliance and overall safety recommendations. Patient would benefit from skilled physical therapy for increased endurance with  functional mobility, increased LE/UE strength, increased independence with functional transfers and improved balance for improved gait quality, return to higher level of function with ADLs, and progress towards therapy goals.   OBJECTIVE  IMPAIRMENTS: Abnormal gait, decreased activity tolerance, decreased balance, decreased coordination, decreased endurance, decreased mobility, difficulty walking, decreased strength, impaired flexibility, postural dysfunction, and pain.   ACTIVITY LIMITATIONS: carrying, lifting, bending, standing, squatting, stairs, transfers, and bed mobility  PARTICIPATION LIMITATIONS: meal prep, cleaning, laundry, shopping, community activity, and yard work  PERSONAL FACTORS: Age, Fitness, Past/current experiences, Time since onset of injury/illness/exacerbation, and 1 comorbidity: MS are also affecting patient's functional outcome.   REHAB POTENTIAL: Fair neurologic pathology  CLINICAL DECISION MAKING: Evolving/moderate complexity  EVALUATION COMPLEXITY: Moderate  PLAN:  PT FREQUENCY: 1-2x/week  PT DURATION: 6 weeks  PLANNED INTERVENTIONS: 97110-Therapeutic exercises, 97530- Therapeutic activity, [id number]- Neuromuscular re-education, 97535- Self Care, [id number]- Manual therapy, [phone]- Gait training, Patient/Family education, Balance training, Stair training, DME instructions, Cryotherapy, and Moist heat  PLAN FOR NEXT SESSION: Progress core and extremity strengthening, progress bed mobility and functional mobility.   Karma Ansley, PT, DPT [facility] Office: [phone] 5:03 PM, 07/26/24

## 2024-07-28 ENCOUNTER — Ambulatory Visit (HOSPITAL_COMMUNITY)

## 2024-07-28 ENCOUNTER — Encounter (HOSPITAL_COMMUNITY): Payer: Self-pay

## 2024-07-28 DIAGNOSIS — Z9181 History of falling: Secondary | ICD-10-CM

## 2024-07-28 DIAGNOSIS — Z7409 Other reduced mobility: Secondary | ICD-10-CM | POA: Diagnosis not present

## 2024-07-28 DIAGNOSIS — G35C0 Secondary progressive multiple sclerosis, unspecified: Secondary | ICD-10-CM

## 2024-07-28 DIAGNOSIS — R29898 Other symptoms and signs involving the musculoskeletal system: Secondary | ICD-10-CM

## 2024-07-28 NOTE — Therapy (Signed)
 OUTPATIENT PHYSICAL THERAPY NEURO TREATMENT   Patient Name: Erin Berg MRN: [id number] DOB:June 04, 1966, 58 y.o., female Today's Date: 07/28/2024    END OF SESSION:  PT End of Session - 07/28/24 1417     Visit Number 6    Number of Visits 12    Date for Recertification  08/02/24    Authorization Type [employer] MEDICARE HMO/PPO    Authorization Time Period no auth required    Progress Note Due on Visit 10    PT Start Time 1417    PT Stop Time 1455    PT Time Calculation (min) 38 min    Equipment Utilized During Treatment Gait belt    Activity Tolerance Patient limited by fatigue;Patient tolerated treatment well    Behavior During Therapy WFL for tasks assessed/performed             Past Medical History:  Diagnosis Date   Anxiety and depression    BMI 24.0-24.9, adult    MS (multiple sclerosis)    Multiple sclerosis    Past Surgical History:  Procedure Laterality Date   COLONOSCOPY WITH PROPOFOL  N/A 12/22/2021   Procedure: COLONOSCOPY WITH PROPOFOL ;  Surgeon: Shaaron Lamar HERO, MD;  Location: [facility];  Service: Endoscopy;  Laterality: N/A;  12:30 / ASA 2   TONSILLECTOMY AND ADENOIDECTOMY     Patient Active Problem List   Diagnosis Date Noted   High risk medication use 04/16/2022   Multiple sclerosis exacerbation 03/20/2022   Hyperlipidemia 03/20/2022   Hypokalemia 03/20/2022   Pyuria 03/20/2022   Taking multiple medications for chronic disease 10/17/2018   Allergic rhinitis 10/14/2007    PCP: Bertell Satterfield, MD  REFERRING PROVIDER: Vear Charlie LABOR, MD ONSET DATE: several months  REFERRING DIAG: 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 Pt states they had to call the ambulance to get her up.  Eval:  Pt states she was diagnoses with MS in the 90s after her left side went completely numb. Pt and caregiver states there has been a decreased amount  of strength noted with bathroom transfers. Pt has fallen in the last 6 months, 4-5 falls during transfers. Struggling with transfers especially with toilet transfer. Pt and caregiver did not like home health.  Pt accompanied by: home health aide  PERTINENT HISTORY:  -Multiple Sclerosis, 1998   PAIN:  Are you having pain? No  PRECAUTIONS: Fall  RED FLAGS: None   WEIGHT BEARING RESTRICTIONS: No  FALLS: Has patient fallen in last 6 months? Yes. Number of falls 4-5  LIVING ENVIRONMENT: Lives with: lives alone and has aide 55 hours a week Lives in: House/apartment Stairs: ramped entrance Has following equipment at home: Environmental Consultant - 2 wheeled, Wheelchair (power), Wheelchair (manual), shower chair, bed side commode, and Ramped entry  PLOF: Needs assistance with ADLs, Needs assistance with homemaking, Needs assistance with gait, and Needs assistance with transfers  PATIENT GOALS: strengthen core, be more independent with transfers, decrease the feeling of feet sticking to floor (coordination)  OBJECTIVE:  Note: Objective measures were completed at Evaluation unless otherwise noted.  DIAGNOSTIC FINDINGS: CLINICAL DATA:  Multiple sclerosis   EXAM: MRI HEAD WITHOUT AND WITH CONTRAST   MRI CERVICAL SPINE WITHOUT AND WITH CONTRAST   TECHNIQUE: Multiplanar, multiecho pulse sequences of the brain and surrounding structures, and cervical spine, to include the  craniocervical junction and cervicothoracic junction, were obtained without and with intravenous contrast.   CONTRAST:  9mL GADAVIST  GADOBUTROL  1 MMOL/ML IV SOLN   COMPARISON:  03/20/2022   FINDINGS: MRI HEAD FINDINGS   Brain: Confluent FLAIR hyperintensity in the cerebral white matter attributed to history of multiple sclerosis with premature generalized brain atrophy especially affecting the white matter with marked corpus callosum thinning. No enhancing plaque or progressive white matter disease. No evidence of superimposed mass, opportunistic infection, or interval infarction.   Vascular: Major flow voids and vascular enhancements are preserved   Skull and upper cervical spine: Normal marrow signal.   Sinuses/Orbits: Unremarkable   MRI CERVICAL SPINE FINDINGS   Alignment: Reversal of cervical lordosis.   Vertebrae: No fracture, evidence of discitis, or bone lesion.   Cord: Assessment Hendrick by motion artifact. No convincing plaque seen today. No abnormal intrathecal enhancement.   Posterior Fossa, vertebral arteries, paraspinal tissues: No perispinal mass or inflammation.   Disc levels: Focal disc space narrowing and endplate degeneration at C5-6 and C6-7 with uncovertebral spurring causing mild bilateral foraminal narrowing based on postcontrast axial images. Minor disc bulging and C6-7.   IMPRESSION: Brain MRI:   Confluent chronic demyelination with white matter atrophy. No enhancing or progressive disease when compared to 2023.   Cervical MRI:   Degraded by motion artifact with no detected plaque today. No cord thinning or abnormal enhancement.  COGNITION: Overall cognitive status: Within functional limits for tasks assessed   SENSATION: WFL  COORDINATION: UE alternating movements and heel to shin difficulty noted   POSTURE: rounded shoulders, posterior pelvic tilt, and flexed trunk   UPPER EXTREMITY MMT:  MMT Right eval Left eval   Shoulder flexion 3+, pain 3  Shoulder extension 3 3  Shoulder abduction 3+, pain 3  Shoulder adduction    Shoulder internal rotation    Shoulder external rotation    Middle trapezius    Lower trapezius    Elbow flexion 4- 4-  Elbow extension 4- 4-  Wrist flexion 3 3  Wrist extension 4- 3  Wrist ulnar deviation    Wrist radial deviation    Wrist pronation    Wrist supination    Grip strength (lbs)    (  Blank rows = not tested)  LOWER EXTREMITY MMT:    MMT Right Eval Left Eval  Hip flexion 3 3  Hip extension    Hip abduction 3 3  Hip adduction 3 3  Hip internal rotation    Hip external rotation    Knee flexion 3+ 3-  Knee extension 3+ 3-  Ankle dorsiflexion 3 4+  Ankle plantarflexion    Ankle inversion    Ankle eversion    (Blank rows = not tested)  BED MOBILITY:  Findings: Sit to supine Complete Independence Supine to sit Modified independence, increased time  TRANSFERS: Sit to stand: Mod A  Assistive device utilized: None     Chair to chair: Mod A  Assistive device utilized: None       GAIT: Findings: Gait Characteristics: shuffling, trunk flexed, poor foot clearance- Right, and poor foot clearance- Left, Distance walked: 3 feet, Level of assistance: Mod A, and Comments: decreased ability to move LLE during weight bearing, mod assist required  FUNCTIONAL TESTS:  30 second chair stand test: 07/07/24: 2 WIht cueing for handplacement and reaching towards RW for stability upon standing Berg balance: 07/07/24: 18/56  PATIENT SURVEYS:  ABC scale:   ABC scale: The Activities-Specific Balance Confidence (ABC) Scale 0% 10 20 30  40 50 60 70 80 90 100% No confidence<->completely confident  How confident are you that you will not lose your balance or become unsteady when you . . .   Date tested 07/07/24  Walk around the house 0%  2. Walk up or down stairs 0%  3. Bend over and pick up a slipper from in front of a closet floor 100% in seated position 0% in standing   4. Reach for a small can off a shelf at eye level 100% in seated position 0% in standing  5. Stand on tip toes and reach for something above your head 0%  6. Stand on a chair and reach for something 0%  7. Sweep the floor 0%  8. Walk outside the house to a car parked in the driveway 0%  9. Get into or out of a car 20%  10. Walk across a parking lot to the mall 0%  11. Walk up or down a ramp 0%  12. Walk in a crowded mall where people rapidly walk past you 0%  13. Are bumped into by people as you walk through the mall 0%  14. Step onto or off of an escalator while you are holding onto the railing 0%  15. Step onto or off an escalator while holding onto parcels such that you cannot hold onto the railing 0%  16. Walk outside on icy sidewalks 0%  Total: #/16 20/1600= 1.25%   BERG  1. SITTING TO STANDING  2 able to stand using hands after several tries   2. STANDING UNSUPPORTED 0 unable to stand 30 seconds unsupported  Required assistance with RW, able to stand unsupported for 18  If a subject is able to stand 2 minutes unsupported, score full points for sitting unsupported. Proceed to item #4  3. SITTING WITH BACK UNSUPPORTED BUT FEET SUPPORTED ON FLOOR OR ON A STOOL 4 able to sit safely and securely for 2 minutes  4. STANDING TO SITTING 2 uses back of legs against chair to control descent  5. TRANSFERS 1 needs one person to assist  6. STANDING UNSUPPORTED WITH EYES CLOSED  3 able to stand 10 seconds with supervision  7. STANDING UNSUPPORTED WITH FEET TOGETHER  1 needs help to attain position but able to stand 15 seconds feet together  8. REACHING FORWARD WITH OUTSTRETCHED ARM WHILE STANDING 1 reaches forward but needs supervision  9. PICK UP OBJECT FROM THE FLOOR FROM A STANDING POSITION  3 able to pick up slipper but needs supervision  10. TURNING TO LOOK BEHIND OVER LEFT AND RIGHT SHOULDERS WHILE STANDING 1 needs supervision when turning  11. TURN 360 DEGREES  0  needs assistance while turning  12. PLACE ALTERNATE FOOT ON STEP OR STOOL WHILE STANDING UNSUPPORTED  0 needs assistance to keep from falling/unable to try Able to tap Lt foot, increased difficulty with Rt, required RW and min A for sagety 13. STANDING UNSUPPORTED ONE FOOT IN FRONT 0 loses balance while stepping or standing  14. STANDING ON ONE LEG 0 unable to try of needs assist to prevent fall   TOTAL SCORE  18/56 0 -20 = wheelchair bound                                                                                                                               TREATMENT DATE:  07/28/2024  Therapeutic Exercise: -Nustep, 5 minutes, level 4 resistance, seat 8 arms at 9, pt cued for 70-80 spm -Seated LAQ, 1 sets of 10 reps, pt cued for max knee extension -Seated marching, 2 sets of 10 reps, 3lb ankle weights, pt cued for max ROM and smooth motion Neuromuscular Re-education: -Cone touches, 2 sets of 10 reps bilaterally, from 4 inch step in parallel bars, pt cued for increased hip flexion and to not kick cones over, CGA required Therapeutic Activity: -Walking laps in parallel bars, 1 lap, pt requires BUE support, L knee locking out this date -Standing dorsiflexion marches, 2 sets of 5 reps, pt cued for increased hip flexion  -Sit to stands, 2 sets of 4 reps, pt cued for core activation and keeping weight close to chest -Chair to chair transfer, x2, pt cued for decreased plopping, sequencing and requires mod assist for ambulation  07/21/2024  Therapeutic Exercise: -Heel raises, 2 sets of 10 reps, pt cued for increased bilateral ankle ROM Neuromuscular Re-education: -Standing balance: 60 seconds   -only one UE support, bilateral Therapeutic Activity: -Walking laps in parallel bars, 2 laps, pt requires BUE support, increased L knee locking out this date -Standing marches, LLE only due to LLE locking out uncontrollably when lifting RLE, 2 sets of 10 reps. -LTKE, 4 inch step, 2 sets of  5 reps, pt cued for decreased knee locking -Sit to stands, 2 sets of 4 reps, pt cued for core activation and keeping weight close to chest -Mat table to chair transfer, x2, pt cued for decreased plopping, sequencing and requires mod assist for ambulation -Bed mobility on mat table rolling, x2 and supine to sit, pt cued for sequencing   07/19/24: Transfer to mat with RW mod/max A Seated balance training: No HHA with 2 feet on floor x  30 each NO HHA with 1 foot on floor x 30 each x 2 Seated marchiung with no UE support 10x Volley ball punch forward then rotate with  10x  c/o neck pain Sitting on dynamic surface Educated importance of posture for pain control Seated posture STS with RW 3 reps standing for ~1 min    PATIENT EDUCATION: Education details: Pt was educated on findings of PT evaluation, prognosis, frequency of therapy visits and rationale, attendance policy, and HEP if given.   Person educated: Patient and Biomedical Engineer Education method: Explanation, Verbal cues, and Handouts Education comprehension: verbalized understanding, verbal cues required, tactile cues required, and needs further education  HOME EXERCISE PROGRAM: Access Code: [email or web address] URL: [email or web address] Date: 06/21/2024 Prepared by: Lang Ada  Exercises - Supine Bridge  - 1 x daily - 7 x weekly - 3 sets - 10 reps - 3 hold - Supine Active Straight Leg Raise  - 1 x daily - 7 x weekly - 3 sets - 10 reps - Seated Long Arc Quad  - 1 x daily - 7 x weekly - 3 sets - 10 reps - Neutral Curl Up with Straight Leg  - 1 x daily - 7 x weekly - 3 sets - 10 reps - 3 hold - Seated Heel Toe Raises  - 1 x daily - 7 x weekly - 3 sets - 10 reps  GOALS: Goals reviewed with patient? No  SHORT TERM GOALS: Target date: 07/12/24  Pt will be independent with HEP in order to demonstrate participation in Physical Therapy POC.  Baseline: Goal status: INITIAL  2.  Pt will improve ABC score by 15% in order  to demonstrate improved pain with functional goals and outcomes. Baseline: see objective Goal status: INITIAL  LONG TERM GOALS: Target date: 08/02/24  Pt will increase BERG balance score by > 12.5 points in order to demonstrate improved functional safety and balance skills in ADL/mobility.   Baseline: see objective.  Goal status: INITIAL  2.  Pt will improve 30 Second Chair Stand Test by 2 reps in order to demonstrate improved functional mobility capacity in community setting.  Baseline: see objective.  Goal status: INITIAL  3.  Pt will improve ABC score by 30% in order to demonstrate improved pain with functional goals and outcomes. Baseline: see objective.  Goal status: INITIAL  4.  Pt will improve chair to chair transfer with SBA in order to demonstrate improved capacity, safety, and overall quality of movement to optimize function.  Baseline: see objective.  Goal status: INITIAL   ASSESSMENT:  CLINICAL IMPRESSION: Patient continues to demonstrate impaired functional mobility, decreased LE/core strength, decreased gait quality and balance. Patient also demonstrates good tolerance to increased aerobic based exercise during today's session on nustep. Patient able to progress dynamic balance and core activation exercises today with standing cone touches and marching variations, good performance with verbal cueing. Patient would continue to benefit from skilled physical therapy for increased endurance with ambulation, increased core/LE strength, and improved balance for improved quality of life, improved independence with functional mobility and continued progress towards therapy goals.    Eval:  Patient is a 58 y.o. 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 Patient demonstrates decreased UE/LE strength, abnormal pain with abduction and flexion with  R shoulder, impaired functional mobility and impaired balance. Patient also demonstrates difficulty with chair to chair transfer with need for mod assist  for standing and initiation of LLE movement during weight bearing. Patient also demonstrates decreased standing balance with inability to stand without hand held assist. Pt modified independent with bed mobility with scooting and supine to sit and sit to supine with increased time required for movents. Patient requires education on role of PT, fall risk, increased physical activity, HEP compliance and overall safety recommendations. Patient would benefit from skilled physical therapy for increased endurance with functional mobility, increased LE/UE strength, increased independence with functional transfers and improved balance for improved gait quality, return to higher level of function with ADLs, and progress towards therapy goals.   OBJECTIVE IMPAIRMENTS: Abnormal gait, decreased activity tolerance, decreased balance, decreased coordination, decreased endurance, decreased mobility, difficulty walking, decreased strength, impaired flexibility, postural dysfunction, and pain.   ACTIVITY LIMITATIONS: carrying, lifting, bending, standing, squatting, stairs, transfers, and bed mobility  PARTICIPATION LIMITATIONS: meal prep, cleaning, laundry, shopping, community activity, and yard work  PERSONAL FACTORS: Age, Fitness, Past/current experiences, Time since onset of injury/illness/exacerbation, and 1 comorbidity: MS are also affecting patient's functional outcome.   REHAB POTENTIAL: Fair neurologic pathology  CLINICAL DECISION MAKING: Evolving/moderate complexity  EVALUATION COMPLEXITY: Moderate  PLAN:  PT FREQUENCY: 1-2x/week  PT DURATION: 6 weeks  PLANNED INTERVENTIONS: 97110-Therapeutic exercises, 97530- Therapeutic activity, [id number]- Neuromuscular re-education, 97535- Self Care, [id number]- Manual therapy, [phone]- Gait training, Patient/Family  education, Balance training, Stair training, DME instructions, Cryotherapy, and Moist heat  PLAN FOR NEXT SESSION: Progress core and extremity strengthening, progress bed mobility and functional mobility.   Lang Ada, PT, DPT [facility] Office: [phone] 3:01 PM, 07/28/2024

## 2024-08-02 ENCOUNTER — Encounter (HOSPITAL_COMMUNITY): Payer: Self-pay

## 2024-08-02 ENCOUNTER — Ambulatory Visit (HOSPITAL_COMMUNITY)

## 2024-08-02 DIAGNOSIS — Z7409 Other reduced mobility: Secondary | ICD-10-CM | POA: Diagnosis not present

## 2024-08-02 DIAGNOSIS — G35C0 Secondary progressive multiple sclerosis, unspecified: Secondary | ICD-10-CM

## 2024-08-02 DIAGNOSIS — Z9181 History of falling: Secondary | ICD-10-CM

## 2024-08-02 DIAGNOSIS — R29898 Other symptoms and signs involving the musculoskeletal system: Secondary | ICD-10-CM

## 2024-08-02 NOTE — Therapy (Signed)
 OUTPATIENT PHYSICAL THERAPY NEURO TREATMENT   Patient Name: Erin Berg MRN: [id number] DOB:[birth year], 58 y.o., female Today's Date: 08/02/2024    END OF SESSION:  PT End of Session - 08/02/24 1416     Visit Number 7    Number of Visits 12    Date for Recertification  08/02/24    Authorization Type [employer] MEDICARE HMO/PPO    Authorization Time Period no auth required    Progress Note Due on Visit 10    PT Start Time 1416    PT Stop Time 1455    PT Time Calculation (min) 39 min    Equipment Utilized During Treatment Gait belt    Activity Tolerance Patient limited by fatigue;Patient tolerated treatment well    Behavior During Therapy WFL for tasks assessed/performed              Past Medical History:  Diagnosis Date   Anxiety and depression    BMI 24.0-24.9, adult    MS (multiple sclerosis)    Multiple sclerosis    Past Surgical History:  Procedure Laterality Date   COLONOSCOPY WITH PROPOFOL  N/A 12/22/2021   Procedure: COLONOSCOPY WITH PROPOFOL ;  Surgeon: Shaaron Lamar HERO, MD;  Location: [facility];  Service: Endoscopy;  Laterality: N/A;  12:30 / ASA 2   TONSILLECTOMY AND ADENOIDECTOMY     Patient Active Problem List   Diagnosis Date Noted   High risk medication use 04/16/2022   Multiple sclerosis exacerbation 03/20/2022   Hyperlipidemia 03/20/2022   Hypokalemia 03/20/2022   Pyuria 03/20/2022   Taking multiple medications for chronic disease 10/17/2018   Allergic rhinitis 10/14/2007    PCP: Bertell Satterfield, MD  REFERRING PROVIDER: Vear Charlie LABOR, MD ONSET DATE: several months  REFERRING DIAG: 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 R26.9 (ICD-10-CM) - Gait disturbance  THERAPY DIAG:  Impaired functional mobility, balance, gait, and endurance  Weakness of both lower extremities  Multiple sclerosis, secondary progressive  History of falling  Rationale for Evaluation and Treatment: Rehabilitation  SUBJECTIVE:                                                                                                                                                                                              SUBJECTIVE STATEMENT: Pt states she fell again trying to make it to the bathroom, had been laying in floor all night, has not had a good day today.   Eval:  Pt states she was diagnoses with MS in the 90s after her left side went completely numb. Pt and caregiver states there has been a decreased amount of strength noted  with bathroom transfers. Pt has fallen in the last 6 months, 4-5 falls during transfers. Struggling with transfers especially with toilet transfer. Pt and caregiver did not like home health.  Pt accompanied by: home health aide  PERTINENT HISTORY:  -Multiple Sclerosis, 1998   PAIN:  Are you having pain? No  PRECAUTIONS: Fall  RED FLAGS: None   WEIGHT BEARING RESTRICTIONS: No  FALLS: Has patient fallen in last 6 months? Yes. Number of falls 4-5  LIVING ENVIRONMENT: Lives with: lives alone and has aide 55 hours a week Lives in: House/apartment Stairs: ramped entrance Has following equipment at home: Environmental Consultant - 2 wheeled, Wheelchair (power), Wheelchair (manual), shower chair, bed side commode, and Ramped entry  PLOF: Needs assistance with ADLs, Needs assistance with homemaking, Needs assistance with gait, and Needs assistance with transfers  PATIENT GOALS: strengthen core, be more independent with transfers, decrease the feeling of feet sticking to floor (coordination)  OBJECTIVE:  Note: Objective measures were completed at Evaluation unless otherwise noted.  DIAGNOSTIC FINDINGS: CLINICAL DATA:  Multiple sclerosis   EXAM: MRI HEAD WITHOUT AND WITH CONTRAST   MRI CERVICAL SPINE WITHOUT AND WITH CONTRAST   TECHNIQUE: Multiplanar, multiecho pulse sequences of the brain and surrounding structures, and cervical spine, to include the craniocervical junction and cervicothoracic  junction, were obtained without and with intravenous contrast.   CONTRAST:  9mL GADAVIST  GADOBUTROL  1 MMOL/ML IV SOLN   COMPARISON:  03/20/2022   FINDINGS: MRI HEAD FINDINGS   Brain: Confluent FLAIR hyperintensity in the cerebral white matter attributed to history of multiple sclerosis with premature generalized brain atrophy especially affecting the white matter with marked corpus callosum thinning. No enhancing plaque or progressive white matter disease. No evidence of superimposed mass, opportunistic infection, or interval infarction.   Vascular: Major flow voids and vascular enhancements are preserved   Skull and upper cervical spine: Normal marrow signal.   Sinuses/Orbits: Unremarkable   MRI CERVICAL SPINE FINDINGS   Alignment: Reversal of cervical lordosis.   Vertebrae: No fracture, evidence of discitis, or bone lesion.   Cord: Assessment Hendrick by motion artifact. No convincing plaque seen today. No abnormal intrathecal enhancement.   Posterior Fossa, vertebral arteries, paraspinal tissues: No perispinal mass or inflammation.   Disc levels: Focal disc space narrowing and endplate degeneration at C5-6 and C6-7 with uncovertebral spurring causing mild bilateral foraminal narrowing based on postcontrast axial images. Minor disc bulging and C6-7.   IMPRESSION: Brain MRI:   Confluent chronic demyelination with white matter atrophy. No enhancing or progressive disease when compared to 2023.   Cervical MRI:   Degraded by motion artifact with no detected plaque today. No cord thinning or abnormal enhancement.  COGNITION: Overall cognitive status: Within functional limits for tasks assessed   SENSATION: WFL  COORDINATION: UE alternating movements and heel to shin difficulty noted   POSTURE: rounded shoulders, posterior pelvic tilt, and flexed trunk   UPPER EXTREMITY MMT:  MMT Right eval Left eval  Shoulder flexion 3+, pain 3  Shoulder extension 3 3   Shoulder abduction 3+, pain 3  Shoulder adduction    Shoulder internal rotation    Shoulder external rotation    Middle trapezius    Lower trapezius    Elbow flexion 4- 4-  Elbow extension 4- 4-  Wrist flexion 3 3  Wrist extension 4- 3  Wrist ulnar deviation    Wrist radial deviation    Wrist pronation    Wrist supination    Grip strength (lbs)    (  Blank rows = not tested)  LOWER EXTREMITY MMT:    MMT Right Eval Left Eval  Hip flexion 3 3  Hip extension    Hip abduction 3 3  Hip adduction 3 3  Hip internal rotation    Hip external rotation    Knee flexion 3+ 3-  Knee extension 3+ 3-  Ankle dorsiflexion 3 4+  Ankle plantarflexion    Ankle inversion    Ankle eversion    (Blank rows = not tested)  BED MOBILITY:  Findings: Sit to supine Complete Independence Supine to sit Modified independence, increased time  TRANSFERS: Sit to stand: Mod A  Assistive device utilized: None     Chair to chair: Mod A  Assistive device utilized: None       GAIT: Findings: Gait Characteristics: shuffling, trunk flexed, poor foot clearance- Right, and poor foot clearance- Left, Distance walked: 3 feet, Level of assistance: Mod A, and Comments: decreased ability to move LLE during weight bearing, mod assist required  FUNCTIONAL TESTS:  30 second chair stand test: 07/07/24: 2 WIht cueing for handplacement and reaching towards RW for stability upon standing Berg balance: 07/07/24: 18/56  PATIENT SURVEYS:  ABC scale:   ABC scale: The Activities-Specific Balance Confidence (ABC) Scale 0% 10 20 30  40 50 60 70 80 90 100% No confidence<->completely confident  How confident are you that you will not lose your balance or become unsteady when you . . .   Date tested 07/07/24  Walk around the house 0%  2. Walk up or down stairs 0%  3. Bend over and pick up a slipper from in front of a closet floor 100% in seated position 0% in standing  4. Reach for a small can off a shelf at eye level  100% in seated position 0% in standing  5. Stand on tip toes and reach for something above your head 0%  6. Stand on a chair and reach for something 0%  7. Sweep the floor 0%  8. Walk outside the house to a car parked in the driveway 0%  9. Get into or out of a car 20%  10. Walk across a parking lot to the mall 0%  11. Walk up or down a ramp 0%  12. Walk in a crowded mall where people rapidly walk past you 0%  13. Are bumped into by people as you walk through the mall 0%  14. Step onto or off of an escalator while you are holding onto the railing 0%  15. Step onto or off an escalator while holding onto parcels such that you cannot hold onto the railing 0%  16. Walk outside on icy sidewalks 0%  Total: #/16 20/1600= 1.25%   BERG  1. SITTING TO STANDING  2 able to stand using hands after several tries   2. STANDING UNSUPPORTED 0 unable to stand 30 seconds unsupported  Required assistance with RW, able to stand unsupported for 18  If a subject is able to stand 2 minutes unsupported, score full points for sitting unsupported. Proceed to item #4  3. SITTING WITH BACK UNSUPPORTED BUT FEET SUPPORTED ON FLOOR OR ON A STOOL 4 able to sit safely and securely for 2 minutes  4. STANDING TO SITTING 2 uses back of legs against chair to control descent  5. TRANSFERS 1 needs one person to assist  6. STANDING UNSUPPORTED WITH EYES CLOSED  3 able to stand 10 seconds with supervision  7. STANDING UNSUPPORTED WITH FEET TOGETHER  1 needs help to attain position but able to stand 15 seconds feet together  8. REACHING FORWARD WITH OUTSTRETCHED ARM WHILE STANDING 1 reaches forward but needs supervision  9. PICK UP OBJECT FROM THE FLOOR FROM A STANDING POSITION  3 able to pick up slipper but needs supervision  10. TURNING TO LOOK BEHIND OVER LEFT AND RIGHT SHOULDERS WHILE STANDING 1 needs supervision when turning  11. TURN 360 DEGREES  0 needs assistance while turning  12. PLACE ALTERNATE  FOOT ON STEP OR STOOL WHILE STANDING UNSUPPORTED  0 needs assistance to keep from falling/unable to try Able to tap Lt foot, increased difficulty with Rt, required RW and min A for sagety 13. STANDING UNSUPPORTED ONE FOOT IN FRONT 0 loses balance while stepping or standing  14. STANDING ON ONE LEG 0 unable to try of needs assist to prevent fall   TOTAL SCORE  18/56 0 -20 = wheelchair bound                                                                                                                               TREATMENT DATE:  08/02/2024  Therapeutic Exercise: -Nustep, 5 minutes, level 4 resistance, seat 8 arms at 9, pt cued for 70-80 spm -Seated LAQ, 1 sets of 10 reps, pt cued for max knee extension -Supine bridges, 2 sets of 10 reps, pt cued for max ROM and smooth motion -Side lying clamshells, 1 set of 10 reps bilaterally, pt cued for sequencing Therapeutic Activity: -Walking laps in parallel bars, 1 lap, pt requires BUE support, L knee locking out this date -Standing heel raises, 1 sets of 10 reps, pt cued for increased hip flexion  -Sit to stands, 2 sets of 4 reps, pt cued for core activation and keeping weight close to chest -Chair to chair transfer, x2, pt cued for decreased plopping, sequencing and requires mod assist for stand pivot transfer  07/28/2024  Therapeutic Exercise: -Nustep, 5 minutes, level 4 resistance, seat 8 arms at 9, pt cued for 70-80 spm -Seated LAQ, 1 sets of 10 reps, pt cued for max knee extension -Seated marching, 2 sets of 10 reps, 3lb ankle weights, pt cued for max ROM and smooth motion Neuromuscular Re-education: -Cone touches, 2 sets of 10 reps bilaterally, from 4 inch step in parallel bars, pt cued for increased hip flexion and to not kick cones over, CGA required Therapeutic Activity: -Walking laps in parallel bars, 1 lap, pt requires BUE support, L knee locking out this date -Standing dorsiflexion marches, 2 sets of 5 reps, pt cued for  increased hip flexion  -Sit to stands, 2 sets of 4 reps, pt cued for core activation and keeping weight close to chest -Chair to chair transfer, x2, pt cued for decreased plopping, sequencing and requires mod assist for ambulation  07/21/2024  Therapeutic Exercise: -Heel raises, 2 sets of 10 reps, pt cued for increased bilateral ankle ROM Neuromuscular Re-education: -Standing balance: 60  seconds   -only one UE support, bilateral Therapeutic Activity: -Walking laps in parallel bars, 2 laps, pt requires BUE support, increased L knee locking out this date -Standing marches, LLE only due to LLE locking out uncontrollably when lifting RLE, 2 sets of 10 reps. -LTKE, 4 inch step, 2 sets of 5 reps, pt cued for decreased knee locking -Sit to stands, 2 sets of 4 reps, pt cued for core activation and keeping weight close to chest -Mat table to chair transfer, x2, pt cued for decreased plopping, sequencing and requires mod assist for ambulation -Bed mobility on mat table rolling, x2 and supine to sit, pt cued for sequencing   PATIENT EDUCATION: Education details: Pt was educated on findings of PT evaluation, prognosis, frequency of therapy visits and rationale, attendance policy, and HEP if given.   Person educated: Patient and Biomedical Engineer Education method: Explanation, Verbal cues, and Handouts Education comprehension: verbalized understanding, verbal cues required, tactile cues required, and needs further education  HOME EXERCISE PROGRAM: Access Code: [email or web address] URL: [email or web address] Date: 06/21/2024 Prepared by: Lang Ada  Exercises - Supine Bridge  - 1 x daily - 7 x weekly - 3 sets - 10 reps - 3 hold - Supine Active Straight Leg Raise  - 1 x daily - 7 x weekly - 3 sets - 10 reps - Seated Long Arc Quad  - 1 x daily - 7 x weekly - 3 sets - 10 reps - Neutral Curl Up with Straight Leg  - 1 x daily - 7 x weekly - 3 sets - 10 reps - 3 hold - Seated Heel Toe Raises  - 1 x  daily - 7 x weekly - 3 sets - 10 reps  GOALS: Goals reviewed with patient? No  SHORT TERM GOALS: Target date: 07/12/24  Pt will be independent with HEP in order to demonstrate participation in Physical Therapy POC.  Baseline: Goal status: INITIAL  2.  Pt will improve ABC score by 15% in order to demonstrate improved pain with functional goals and outcomes. Baseline: see objective Goal status: INITIAL  LONG TERM GOALS: Target date: 08/02/24  Pt will increase BERG balance score by > 12.5 points in order to demonstrate improved functional safety and balance skills in ADL/mobility.   Baseline: see objective.  Goal status: INITIAL  2.  Pt will improve 30 Second Chair Stand Test by 2 reps in order to demonstrate improved functional mobility capacity in community setting.  Baseline: see objective.  Goal status: INITIAL  3.  Pt will improve ABC score by 30% in order to demonstrate improved pain with functional goals and outcomes. Baseline: see objective.  Goal status: INITIAL  4.  Pt will improve chair to chair transfer with SBA in order to demonstrate improved capacity, safety, and overall quality of movement to optimize function.  Baseline: see objective.  Goal status: INITIAL   ASSESSMENT:  CLINICAL IMPRESSION: Patient demonstrates increased difficulty with functional mobility, decreased LE/core strength, decreased gait quality and balance during today's session. Patient also demonstrates decreased tolerance to walking and prolonged standing, probably flare up of MS condition. Patient able to continue dynamic balance and core activation exercises today with bridges and clamshells, good performance with verbal cueing. Patient would continue to benefit from skilled physical therapy for increased endurance with ambulation, increased core/LE strength, and improved balance for improved quality of life, improved independence with functional mobility and continued progress towards therapy  goals.    Eval:  Patient is a 58 y.o. 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 Patient demonstrates decreased UE/LE strength, abnormal pain with abduction and flexion with R shoulder, impaired functional mobility and impaired balance. Patient also demonstrates difficulty with chair to chair transfer with need for mod assist for standing and initiation of LLE movement during weight bearing. Patient also demonstrates decreased standing balance with inability to stand without hand held assist. Pt modified independent with bed mobility with scooting and supine to sit and sit to supine with increased time required for movents. Patient requires education on role of PT, fall risk, increased physical activity, HEP compliance and overall safety recommendations. Patient would benefit from skilled physical therapy for increased endurance with functional mobility, increased LE/UE strength, increased independence with functional transfers and improved balance for improved gait quality, return to higher level of function with ADLs, and progress towards therapy goals.   OBJECTIVE IMPAIRMENTS: Abnormal gait, decreased activity tolerance, decreased balance, decreased coordination, decreased endurance, decreased mobility, difficulty walking, decreased strength, impaired flexibility, postural dysfunction, and pain.   ACTIVITY LIMITATIONS: carrying, lifting, bending, standing, squatting, stairs, transfers, and bed mobility  PARTICIPATION LIMITATIONS: meal prep, cleaning, laundry, shopping, community activity, and yard work  PERSONAL FACTORS: Age, Fitness, Past/current experiences, Time since onset of injury/illness/exacerbation, and 1 comorbidity: MS are also affecting patient's functional outcome.   REHAB POTENTIAL: Fair neurologic pathology  CLINICAL DECISION MAKING:  Evolving/moderate complexity  EVALUATION COMPLEXITY: Moderate  PLAN:  PT FREQUENCY: 1-2x/week  PT DURATION: 6 weeks  PLANNED INTERVENTIONS: 97110-Therapeutic exercises, 97530- Therapeutic activity, [id number]- Neuromuscular re-education, 97535- Self Care, [id number]- Manual therapy, [phone]- Gait training, Patient/Family education, Balance training, Stair training, DME instructions, Cryotherapy, and Moist heat  PLAN FOR NEXT SESSION: Progress core and extremity strengthening, progress bed mobility and functional mobility. Reassess   Lang Ada, PT, DPT [facility] Office: [phone] 3:04 PM, 08/02/2024

## 2024-08-04 ENCOUNTER — Ambulatory Visit (HOSPITAL_COMMUNITY)

## 2024-08-16 ENCOUNTER — Ambulatory Visit (HOSPITAL_COMMUNITY)

## 2024-08-18 ENCOUNTER — Telehealth (HOSPITAL_COMMUNITY): Payer: Self-pay

## 2024-08-18 ENCOUNTER — Ambulatory Visit (HOSPITAL_COMMUNITY): Attending: Neurology

## 2024-08-18 ENCOUNTER — Ambulatory Visit
Admission: RE | Admit: 2024-08-18 | Discharge: 2024-08-18 | Disposition: A | Discharge status: Home / Self Care | Source: Ambulatory Visit | Attending: Internal Medicine | Admitting: Internal Medicine

## 2024-08-18 DIAGNOSIS — G35C0 Secondary progressive multiple sclerosis, unspecified: Secondary | ICD-10-CM | POA: Insufficient documentation

## 2024-08-18 DIAGNOSIS — R29898 Other symptoms and signs involving the musculoskeletal system: Secondary | ICD-10-CM | POA: Insufficient documentation

## 2024-08-18 DIAGNOSIS — Z7409 Other reduced mobility: Secondary | ICD-10-CM | POA: Insufficient documentation

## 2024-08-18 DIAGNOSIS — Z1231 Encounter for screening mammogram for malignant neoplasm of breast: Secondary | ICD-10-CM

## 2024-08-18 DIAGNOSIS — Z9181 History of falling: Secondary | ICD-10-CM | POA: Insufficient documentation

## 2024-08-18 NOTE — Telephone Encounter (Signed)
 Pt was called concerning her missed appointment this afternoon and no messages were able to be left. This is pts first no show. We will hope to see pt at next appointment next week.  Lang Ada, PT, DPT [facility] Office: [phone] 3:29 PM, 08/18/2024

## 2024-08-23 ENCOUNTER — Ambulatory Visit (HOSPITAL_COMMUNITY)

## 2024-08-25 ENCOUNTER — Telehealth (HOSPITAL_COMMUNITY): Payer: Self-pay

## 2024-08-25 ENCOUNTER — Ambulatory Visit (HOSPITAL_COMMUNITY)

## 2024-08-25 NOTE — Telephone Encounter (Signed)
 Pt was called concerning her missed treatment this afternoon. This is pts second no show policy. Pt states MS is in her hand and she has been in the bed most of the week due to feeling weak. Pt reminded of HEP bed exercises and next appointment time on Monday and Wednesday of next week, we will look forward to seeing her soon.   Lang Ada, PT, DPT [facility] Office: [phone] 3:04 PM, 08/25/2024

## 2024-08-28 ENCOUNTER — Encounter (HOSPITAL_COMMUNITY): Payer: Self-pay

## 2024-08-28 ENCOUNTER — Ambulatory Visit (HOSPITAL_COMMUNITY)

## 2024-08-28 DIAGNOSIS — Z9181 History of falling: Secondary | ICD-10-CM | POA: Diagnosis present

## 2024-08-28 DIAGNOSIS — G35C0 Secondary progressive multiple sclerosis, unspecified: Secondary | ICD-10-CM | POA: Diagnosis present

## 2024-08-28 DIAGNOSIS — R29898 Other symptoms and signs involving the musculoskeletal system: Secondary | ICD-10-CM | POA: Diagnosis present

## 2024-08-28 DIAGNOSIS — Z7409 Other reduced mobility: Secondary | ICD-10-CM

## 2024-08-28 NOTE — Therapy (Addendum)
 " OUTPATIENT PHYSICAL THERAPY NEURO TREATMENT  Progress Note Reporting Period 06/21/24 to 08/28/24  See note below for Objective Data and Assessment of Progress/Goals.     Patient Name: Erin Berg MRN: [id number] DOB:05-28-1966, 59 y.o., female Today's Date: 08/28/2024    END OF SESSION:  PT End of Session - 08/28/24 1031     Visit Number 8    Number of Visits 12    Date for Recertification  10/09/24    Authorization Type [employer] MEDICARE HMO/PPO    Authorization Time Period no auth required    Progress Note Due on Visit 18    PT Start Time 1031    PT Stop Time 1110    PT Time Calculation (min) 39 min    Equipment Utilized During Treatment Gait belt    Activity Tolerance Patient limited by fatigue;Patient tolerated treatment well    Behavior During Therapy WFL for tasks assessed/performed               Past Medical History:  Diagnosis Date   Anxiety and depression    BMI 24.0-24.9, adult    MS (multiple sclerosis)    Multiple sclerosis    Past Surgical History:  Procedure Laterality Date   COLONOSCOPY WITH PROPOFOL  N/A 12/22/2021   Procedure: COLONOSCOPY WITH PROPOFOL ;  Surgeon: Shaaron Lamar HERO, MD;  Location: [facility];  Service: Endoscopy;  Laterality: N/A;  12:30 / ASA 2   TONSILLECTOMY AND ADENOIDECTOMY     Patient Active Problem List   Diagnosis Date Noted   High risk medication use 04/16/2022   Multiple sclerosis exacerbation 03/20/2022   Hyperlipidemia 03/20/2022   Hypokalemia 03/20/2022   Pyuria 03/20/2022   Taking multiple medications for chronic disease 10/17/2018   Allergic rhinitis 10/14/2007    PCP: Bertell Satterfield, MD  REFERRING PROVIDER: Vear Charlie LABOR, MD ONSET DATE: several months  REFERRING DIAG: 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 R26.9 (ICD-10-CM) - Gait disturbance  THERAPY DIAG:  Impaired functional mobility, balance, gait, and endurance - Plan: PT plan of care cert/re-cert  Weakness  of both lower extremities - Plan: PT plan of care cert/re-cert  Multiple sclerosis, secondary progressive - Plan: PT plan of care cert/re-cert  History of falling - Plan: PT plan of care cert/re-cert  Rationale for Evaluation and Treatment: Rehabilitation  SUBJECTIVE:                                                                                                                                                                                             SUBJECTIVE STATEMENT: Pt states MS has been pretty rough for the  last couple of weeks. Pt states it has been several weeks since last fall. Pts caregiver states she is having difficulty with transfer chair to toilet transfer due to left foot and trunk weakness.    Eval:  Pt states she was diagnoses with MS in the 90s after her left side went completely numb. Pt and caregiver states there has been a decreased amount of strength noted with bathroom transfers. Pt has fallen in the last 6 months, 4-5 falls during transfers. Struggling with transfers especially with toilet transfer. Pt and caregiver did not like home health.  Pt accompanied by: home health aide  PERTINENT HISTORY:  -Multiple Sclerosis, 1998   PAIN:  Are you having pain? No  PRECAUTIONS: Fall  RED FLAGS: None   WEIGHT BEARING RESTRICTIONS: No  FALLS: Has patient fallen in last 6 months? Yes. Number of falls 4-5  LIVING ENVIRONMENT: Lives with: lives alone and has aide 55 hours a week Lives in: House/apartment Stairs: ramped entrance Has following equipment at home: Environmental Consultant - 2 wheeled, Wheelchair (power), Wheelchair (manual), shower chair, bed side commode, and Ramped entry  PLOF: Needs assistance with ADLs, Needs assistance with homemaking, Needs assistance with gait, and Needs assistance with transfers  PATIENT GOALS: strengthen core, be more independent with transfers, decrease the feeling of feet sticking to floor (coordination)  OBJECTIVE:  Note: Objective  measures were completed at Evaluation unless otherwise noted.  DIAGNOSTIC FINDINGS: CLINICAL DATA:  Multiple sclerosis   EXAM: MRI HEAD WITHOUT AND WITH CONTRAST   MRI CERVICAL SPINE WITHOUT AND WITH CONTRAST   TECHNIQUE: Multiplanar, multiecho pulse sequences of the brain and surrounding structures, and cervical spine, to include the craniocervical junction and cervicothoracic junction, were obtained without and with intravenous contrast.   CONTRAST:  9mL GADAVIST  GADOBUTROL  1 MMOL/ML IV SOLN   COMPARISON:  03/20/2022   FINDINGS: MRI HEAD FINDINGS   Brain: Confluent FLAIR hyperintensity in the cerebral white matter attributed to history of multiple sclerosis with premature generalized brain atrophy especially affecting the white matter with marked corpus callosum thinning. No enhancing plaque or progressive white matter disease. No evidence of superimposed mass, opportunistic infection, or interval infarction.   Vascular: Major flow voids and vascular enhancements are preserved   Skull and upper cervical spine: Normal marrow signal.   Sinuses/Orbits: Unremarkable   MRI CERVICAL SPINE FINDINGS   Alignment: Reversal of cervical lordosis.   Vertebrae: No fracture, evidence of discitis, or bone lesion.   Cord: Assessment Hendrick by motion artifact. No convincing plaque seen today. No abnormal intrathecal enhancement.   Posterior Fossa, vertebral arteries, paraspinal tissues: No perispinal mass or inflammation.   Disc levels: Focal disc space narrowing and endplate degeneration at C5-6 and C6-7 with uncovertebral spurring causing mild bilateral foraminal narrowing based on postcontrast axial images. Minor disc bulging and C6-7.   IMPRESSION: Brain MRI:   Confluent chronic demyelination with white matter atrophy. No enhancing or progressive disease when compared to 2023.   Cervical MRI:   Degraded by motion artifact with no detected plaque today. No  cord thinning or abnormal enhancement.  COGNITION: Overall cognitive status: Within functional limits for tasks assessed   SENSATION: WFL  COORDINATION: UE alternating movements and heel to shin difficulty noted   POSTURE: rounded shoulders, posterior pelvic tilt, and flexed trunk   UPPER EXTREMITY MMT:  MMT Right eval Left eval  Shoulder flexion 3+, pain 3  Shoulder extension 3 3  Shoulder abduction 3+, pain 3  Shoulder adduction    Shoulder  internal rotation    Shoulder external rotation    Middle trapezius    Lower trapezius    Elbow flexion 4- 4-  Elbow extension 4- 4-  Wrist flexion 3 3  Wrist extension 4- 3  Wrist ulnar deviation    Wrist radial deviation    Wrist pronation    Wrist supination    Grip strength (lbs)    (Blank rows = not tested)  LOWER EXTREMITY MMT:    MMT Right Eval Left Eval  Hip flexion 3 3  Hip extension    Hip abduction 3 3  Hip adduction 3 3  Hip internal rotation    Hip external rotation    Knee flexion 3+ 3-  Knee extension 3+ 3-  Ankle dorsiflexion 3 4+  Ankle plantarflexion    Ankle inversion    Ankle eversion    (Blank rows = not tested)  BED MOBILITY:  Findings: Sit to supine Complete Independence Supine to sit Modified independence, increased time  TRANSFERS: Sit to stand: Mod A  Assistive device utilized: None     Chair to chair: Mod A  Assistive device utilized: None       GAIT: Findings: Gait Characteristics: shuffling, trunk flexed, poor foot clearance- Right, and poor foot clearance- Left, Distance walked: 3 feet, Level of assistance: Mod A, and Comments: decreased ability to move LLE during weight bearing, mod assist required  FUNCTIONAL TESTS:  30 second chair stand test: 07/07/24: 2 WIht cueing for handplacement and reaching towards RW for stability upon standing Berg balance: 07/07/24: 18/56  08/28/24: 3 reps, pt requires RW for stability and cueing for proper hand placement  PATIENT SURVEYS:  ABC  scale:   ABC scale: The Activities-Specific Balance Confidence (ABC) Scale 0% 10 20 30  40 50 60 70 80 90 100% No confidence<->completely confident  How confident are you that you will not lose your balance or become unsteady when you . . .   Date tested 07/07/24  Walk around the house 0%  2. Walk up or down stairs 0%  3. Bend over and pick up a slipper from in front of a closet floor 100% in seated position 0% in standing  4. Reach for a small can off a shelf at eye level 100% in seated position 0% in standing  5. Stand on tip toes and reach for something above your head 0%  6. Stand on a chair and reach for something 0%  7. Sweep the floor 0%  8. Walk outside the house to a car parked in the driveway 0%  9. Get into or out of a car 20%  10. Walk across a parking lot to the mall 0%  11. Walk up or down a ramp 0%  12. Walk in a crowded mall where people rapidly walk past you 0%  13. Are bumped into by people as you walk through the mall 0%  14. Step onto or off of an escalator while you are holding onto the railing 0%  15. Step onto or off an escalator while holding onto parcels such that you cannot hold onto the railing 0%  16. Walk outside on icy sidewalks 0%  Total: #/16 20/1600= 1.25%   BERG  1. SITTING TO STANDING  2 able to stand using hands after several tries   2. STANDING UNSUPPORTED 0 unable to stand 30 seconds unsupported  Required assistance with RW, able to stand unsupported for 18  If a subject is able to stand 2  minutes unsupported, score full points for sitting unsupported. Proceed to item #4  3. SITTING WITH BACK UNSUPPORTED BUT FEET SUPPORTED ON FLOOR OR ON A STOOL 4 able to sit safely and securely for 2 minutes  4. STANDING TO SITTING 2 uses back of legs against chair to control descent  5. TRANSFERS 1 needs one person to assist  6. STANDING UNSUPPORTED WITH EYES CLOSED  3 able to stand 10 seconds with supervision  7. STANDING UNSUPPORTED WITH FEET  TOGETHER 1 needs help to attain position but able to stand 15 seconds feet together  8. REACHING FORWARD WITH OUTSTRETCHED ARM WHILE STANDING 1 reaches forward but needs supervision  9. PICK UP OBJECT FROM THE FLOOR FROM A STANDING POSITION  3 able to pick up slipper but needs supervision  10. TURNING TO LOOK BEHIND OVER LEFT AND RIGHT SHOULDERS WHILE STANDING 1 needs supervision when turning  11. TURN 360 DEGREES  0 needs assistance while turning  12. PLACE ALTERNATE FOOT ON STEP OR STOOL WHILE STANDING UNSUPPORTED  0 needs assistance to keep from falling/unable to try Able to tap Lt foot, increased difficulty with Rt, required RW and min A for sagety 13. STANDING UNSUPPORTED ONE FOOT IN FRONT 0 loses balance while stepping or standing  14. STANDING ON ONE LEG 0 unable to try of needs assist to prevent fall   TOTAL SCORE  18/56 0 -20 = wheelchair bound  08/28/24: ABC score: 80 / 1600 = 5.0 % BERG balance: 10 / 56 = 17.9 %                                                                                                                             TREATMENT DATE:  08/28/2024  Progress note: goals tracked, BERG balance, 30 seconds STS, pt education on importance of HEP compliance and overall POC.  08/02/2024  Therapeutic Exercise: -Nustep, 5 minutes, level 4 resistance, seat 8 arms at 9, pt cued for 70-80 spm -Seated LAQ, 1 sets of 10 reps, pt cued for max knee extension -Supine bridges, 2 sets of 10 reps, pt cued for max ROM and smooth motion -Side lying clamshells, 1 set of 10 reps bilaterally, pt cued for sequencing Therapeutic Activity: -Walking laps in parallel bars, 1 lap, pt requires BUE support, L knee locking out this date -Standing heel raises, 1 sets of 10 reps, pt cued for increased hip flexion  -Sit to stands, 2 sets of 4 reps, pt cued for core activation and keeping weight close to chest -Chair to chair transfer, x2, pt cued for decreased plopping, sequencing and  requires mod assist for stand pivot transfer  07/28/2024  Therapeutic Exercise: -Nustep, 5 minutes, level 4 resistance, seat 8 arms at 9, pt cued for 70-80 spm -Seated LAQ, 1 sets of 10 reps, pt cued for max knee extension -Seated marching, 2 sets of 10 reps, 3lb ankle weights, pt cued for max ROM and smooth motion Neuromuscular Re-education: -Cone  touches, 2 sets of 10 reps bilaterally, from 4 inch step in parallel bars, pt cued for increased hip flexion and to not kick cones over, CGA required Therapeutic Activity: -Walking laps in parallel bars, 1 lap, pt requires BUE support, L knee locking out this date -Standing dorsiflexion marches, 2 sets of 5 reps, pt cued for increased hip flexion  -Sit to stands, 2 sets of 4 reps, pt cued for core activation and keeping weight close to chest -Chair to chair transfer, x2, pt cued for decreased plopping, sequencing and requires mod assist for ambulation   PATIENT EDUCATION: Education details: Pt was educated on findings of PT evaluation, prognosis, frequency of therapy visits and rationale, attendance policy, and HEP if given.   Person educated: Patient and Biomedical Engineer Education method: Explanation, Verbal cues, and Handouts Education comprehension: verbalized understanding, verbal cues required, tactile cues required, and needs further education  HOME EXERCISE PROGRAM: Access Code: [email or web address] URL: [email or web address] Date: 08/28/2024 Prepared by: Lang Ada  Exercises - Supine Bridge  - 1 x daily - 7 x weekly - 3 sets - 10 reps - 3 hold - Supine Active Straight Leg Raise  - 1 x daily - 7 x weekly - 3 sets - 10 reps - Seated Long Arc Quad  - 1 x daily - 7 x weekly - 3 sets - 10 reps - Neutral Curl Up with Straight Leg  - 1 x daily - 7 x weekly - 3 sets - 10 reps - 3 hold - Seated Heel Toe Raises  - 1 x daily - 7 x weekly - 3 sets - 10 reps - Sit to Stand with Counter Support  - 2 x daily - 7 x weekly - 1 sets - 10 reps -  Standing Marching With Four-Wheeled Walker  - 1 x daily - 7 x weekly - 1 sets - 5 reps - 120s hold  GOALS: Goals reviewed with patient? Yes  SHORT TERM GOALS: Target date: 07/12/24  Pt will be independent with HEP in order to demonstrate participation in Physical Therapy POC.  Baseline: Goal status: MET  2.  Pt will improve ABC score by 15% in order to demonstrate improved pain with functional goals and outcomes. Baseline: see objective Goal status: IN PROGRESS  LONG TERM GOALS: Target date: 08/02/24  Pt will increase BERG balance score by > 12.5 points in order to demonstrate improved functional safety and balance skills in ADL/mobility.   Baseline: see objective.  Goal status: IN PROGRESS  2.  Pt will improve 30 Second Chair Stand Test by 2 reps in order to demonstrate improved functional mobility capacity in community setting.  Baseline: see objective.  Goal status: IN PROGRESS  3.  Pt will improve ABC score by 30% in order to demonstrate improved pain with functional goals and outcomes. Baseline: see objective.  Goal status: IN PROGRESS  4.  Pt will improve chair to chair transfer with SBA in order to demonstrate improved capacity, safety, and overall quality of movement to optimize function.  Baseline: see objective.  Goal status: IN PROGRESS   ASSESSMENT:  CLINICAL IMPRESSION: PN: Patient continues to demonstrate increased difficulty with functional mobility, decreased LE/core strength, decreased gait quality and balance during today's session. Patient also demonstrates decreased tolerance to standing with not UE support and requires verbal cued for balance challenges with RW for hand placement. Pt has only demonstrated ability to meet one of the initial six therapy goals although she has shown some progress towards most. Pt  seems to have regressed since the last several weeks with increased neurological symptoms reported, likely MS exacerbation. Patient would continue to  benefit from skilled physical therapy for increased endurance with ambulation, increased core/LE strength, and improved balance for improved quality of life, improved independence with functional mobility and continued progress towards therapy goals.    Eval:  Patient is a 59 y.o. 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 Patient demonstrates decreased UE/LE strength, abnormal pain with abduction and flexion with R shoulder, impaired functional mobility and impaired balance. Patient also demonstrates difficulty with chair to chair transfer with need for mod assist for standing and initiation of LLE movement during weight bearing. Patient also demonstrates decreased standing balance with inability to stand without hand held assist. Pt modified independent with bed mobility with scooting and supine to sit and sit to supine with increased time required for movents. Patient requires education on role of PT, fall risk, increased physical activity, HEP compliance and overall safety recommendations. Patient would benefit from skilled physical therapy for increased endurance with functional mobility, increased LE/UE strength, increased independence with functional transfers and improved balance for improved gait quality, return to higher level of function with ADLs, and progress towards therapy goals.   OBJECTIVE IMPAIRMENTS: Abnormal gait, decreased activity tolerance, decreased balance, decreased coordination, decreased endurance, decreased mobility, difficulty walking, decreased strength, impaired flexibility, postural dysfunction, and pain.   ACTIVITY LIMITATIONS: carrying, lifting, bending, standing, squatting, stairs, transfers, and bed mobility  PARTICIPATION LIMITATIONS: meal prep, cleaning, laundry, shopping, community activity, and yard work  PERSONAL FACTORS:  Age, Fitness, Past/current experiences, Time since onset of injury/illness/exacerbation, and 1 comorbidity: MS are also affecting patient's functional outcome.   REHAB POTENTIAL: Fair neurologic pathology  CLINICAL DECISION MAKING: Evolving/moderate complexity  EVALUATION COMPLEXITY: Moderate  PLAN:  PT FREQUENCY: 1-2x/week  PT DURATION: 6 weeks  PLANNED INTERVENTIONS: 97110-Therapeutic exercises, 97530- Therapeutic activity, [id number]- Neuromuscular re-education, 97535- Self Care, [id number]- Manual therapy, [phone]- Gait training, Patient/Family education, Balance training, Stair training, DME instructions, Cryotherapy, and Moist heat  PLAN FOR NEXT SESSION: Progress core and extremity strengthening, progress bed mobility and functional mobility. Extend for 1-2 times per week for 6 weeks.   Lang Ada, PT, DPT [facility] Office: [phone] 12:26 PM, 08/28/2024          "

## 2024-08-30 ENCOUNTER — Ambulatory Visit (HOSPITAL_COMMUNITY)

## 2024-08-30 ENCOUNTER — Encounter (HOSPITAL_COMMUNITY): Payer: Self-pay

## 2024-08-30 DIAGNOSIS — Z9181 History of falling: Secondary | ICD-10-CM

## 2024-08-30 DIAGNOSIS — Z7409 Other reduced mobility: Secondary | ICD-10-CM | POA: Diagnosis not present

## 2024-08-30 DIAGNOSIS — G35C0 Secondary progressive multiple sclerosis, unspecified: Secondary | ICD-10-CM

## 2024-08-30 DIAGNOSIS — R29898 Other symptoms and signs involving the musculoskeletal system: Secondary | ICD-10-CM

## 2024-08-30 NOTE — Therapy (Signed)
 " OUTPATIENT PHYSICAL THERAPY NEURO TREATMENT  Patient Name: Erin Berg MRN: [id number] DOB:Sep 16, 1965, 59 y.o., female Today's Date: 08/30/2024    END OF SESSION:  PT End of Session - 08/30/24 1359     Visit Number 9    Number of Visits 12    Date for Recertification  10/09/24    Authorization Type [employer] MEDICARE HMO/PPO    Authorization Time Period no auth required    Progress Note Due on Visit 18    PT Start Time 1402    PT Stop Time 1442    PT Time Calculation (min) 40 min    Equipment Utilized During Treatment Gait belt    Activity Tolerance Patient limited by fatigue;Patient tolerated treatment well    Behavior During Therapy WFL for tasks assessed/performed               Past Medical History:  Diagnosis Date   Anxiety and depression    BMI 24.0-24.9, adult    MS (multiple sclerosis)    Multiple sclerosis    Past Surgical History:  Procedure Laterality Date   COLONOSCOPY WITH PROPOFOL  N/A 12/22/2021   Procedure: COLONOSCOPY WITH PROPOFOL ;  Surgeon: Shaaron Lamar HERO, MD;  Location: [facility];  Service: Endoscopy;  Laterality: N/A;  12:30 / ASA 2   TONSILLECTOMY AND ADENOIDECTOMY     Patient Active Problem List   Diagnosis Date Noted   High risk medication use 04/16/2022   Multiple sclerosis exacerbation 03/20/2022   Hyperlipidemia 03/20/2022   Hypokalemia 03/20/2022   Pyuria 03/20/2022   Taking multiple medications for chronic disease 10/17/2018   Allergic rhinitis 10/14/2007    PCP: Bertell Satterfield, MD  REFERRING PROVIDER: Vear Charlie LABOR, MD ONSET DATE: several months  REFERRING DIAG: 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  Stated she wore herself out completing the exercises yesterday.  No reports of pain or recent fall.    Eval:  Pt states she was diagnoses with MS in the 90s after her left side went completely numb. Pt and caregiver states there has been  a decreased amount of strength noted with bathroom transfers. Pt has fallen in the last 6 months, 4-5 falls during transfers. Struggling with transfers especially with toilet transfer. Pt and caregiver did not like home health.  Pt accompanied by: home health aide  PERTINENT HISTORY:  -Multiple Sclerosis, 1998   PAIN:  Are you having pain? No  PRECAUTIONS: Fall  RED FLAGS: None   WEIGHT BEARING RESTRICTIONS: No  FALLS: Has patient fallen in last 6 months? Yes. Number of falls 4-5  LIVING ENVIRONMENT: Lives with: lives alone and has aide 55 hours a week Lives in: House/apartment Stairs: ramped entrance Has following equipment at home: Environmental Consultant - 2 wheeled, Wheelchair (power), Wheelchair (manual), shower chair, bed side commode, and Ramped entry  PLOF: Needs assistance with ADLs, Needs assistance with homemaking, Needs assistance with gait, and Needs assistance with transfers  PATIENT GOALS: strengthen core, be more independent with transfers, decrease the feeling of feet sticking to floor (coordination)  OBJECTIVE:  Note: Objective measures were completed at Evaluation unless otherwise noted.  DIAGNOSTIC FINDINGS: CLINICAL DATA:  Multiple sclerosis   EXAM: MRI HEAD WITHOUT AND WITH CONTRAST   MRI CERVICAL SPINE WITHOUT AND WITH CONTRAST   TECHNIQUE: Multiplanar, multiecho pulse sequences of the brain and surrounding structures, and cervical spine, to include the  craniocervical junction and cervicothoracic junction, were obtained without and with intravenous contrast.   CONTRAST:  9mL GADAVIST  GADOBUTROL  1 MMOL/ML IV SOLN   COMPARISON:  03/20/2022   FINDINGS: MRI HEAD FINDINGS   Brain: Confluent FLAIR hyperintensity in the cerebral white matter attributed to history of multiple sclerosis with premature generalized brain atrophy especially affecting the white matter with marked corpus callosum thinning. No enhancing plaque or progressive white matter disease. No evidence of superimposed mass, opportunistic infection, or interval infarction.   Vascular: Major flow voids and vascular enhancements are preserved   Skull and upper cervical spine: Normal marrow signal.   Sinuses/Orbits: Unremarkable   MRI CERVICAL SPINE FINDINGS   Alignment: Reversal of cervical lordosis.   Vertebrae: No fracture, evidence of discitis, or bone lesion.   Cord: Assessment Hendrick by motion artifact. No convincing plaque seen today. No abnormal intrathecal enhancement.   Posterior Fossa, vertebral arteries, paraspinal tissues: No perispinal mass or inflammation.   Disc levels: Focal disc space narrowing and endplate degeneration at C5-6 and C6-7 with uncovertebral spurring causing mild bilateral foraminal narrowing based on postcontrast axial images. Minor disc bulging and C6-7.   IMPRESSION: Brain MRI:   Confluent chronic demyelination with white matter atrophy. No enhancing or progressive disease when compared to 2023.   Cervical MRI:   Degraded by motion artifact with no detected plaque today. No cord thinning or abnormal enhancement.  COGNITION: Overall cognitive status: Within functional limits for tasks assessed   SENSATION: WFL  COORDINATION: UE alternating movements and heel to shin difficulty noted   POSTURE: rounded shoulders, posterior pelvic tilt, and flexed trunk   UPPER EXTREMITY MMT:  MMT Right eval Left eval   Shoulder flexion 3+, pain 3  Shoulder extension 3 3  Shoulder abduction 3+, pain 3  Shoulder adduction    Shoulder internal rotation    Shoulder external rotation    Middle trapezius    Lower trapezius    Elbow flexion 4- 4-  Elbow extension 4- 4-  Wrist flexion 3 3  Wrist extension 4- 3  Wrist ulnar deviation    Wrist radial deviation    Wrist pronation    Wrist supination  Grip strength (lbs)    (Blank rows = not tested)  LOWER EXTREMITY MMT:    MMT Right Eval Left Eval  Hip flexion 3 3  Hip extension    Hip abduction 3 3  Hip adduction 3 3  Hip internal rotation    Hip external rotation    Knee flexion 3+ 3-  Knee extension 3+ 3-  Ankle dorsiflexion 3 4+  Ankle plantarflexion    Ankle inversion    Ankle eversion    (Blank rows = not tested)  BED MOBILITY:  Findings: Sit to supine Complete Independence Supine to sit Modified independence, increased time  TRANSFERS: Sit to stand: Mod A  Assistive device utilized: None     Chair to chair: Mod A  Assistive device utilized: None       GAIT: Findings: Gait Characteristics: shuffling, trunk flexed, poor foot clearance- Right, and poor foot clearance- Left, Distance walked: 3 feet, Level of assistance: Mod A, and Comments: decreased ability to move LLE during weight bearing, mod assist required  FUNCTIONAL TESTS:  30 second chair stand test: 07/07/24: 2 WIht cueing for handplacement and reaching towards RW for stability upon standing Berg balance: 07/07/24: 18/56  08/28/24: 3 reps, pt requires RW for stability and cueing for proper hand placement  PATIENT SURVEYS:  ABC scale:   ABC scale: The Activities-Specific Balance Confidence (ABC) Scale 0% 10 20 30  40 50 60 70 80 90 100% No confidence<->completely confident  How confident are you that you will not lose your balance or become unsteady when you . . .   Date tested 07/07/24  Walk around the house 0%  2. Walk up or down stairs 0%  3. Bend over and  pick up a slipper from in front of a closet floor 100% in seated position 0% in standing  4. Reach for a small can off a shelf at eye level 100% in seated position 0% in standing  5. Stand on tip toes and reach for something above your head 0%  6. Stand on a chair and reach for something 0%  7. Sweep the floor 0%  8. Walk outside the house to a car parked in the driveway 0%  9. Get into or out of a car 20%  10. Walk across a parking lot to the mall 0%  11. Walk up or down a ramp 0%  12. Walk in a crowded mall where people rapidly walk past you 0%  13. Are bumped into by people as you walk through the mall 0%  14. Step onto or off of an escalator while you are holding onto the railing 0%  15. Step onto or off an escalator while holding onto parcels such that you cannot hold onto the railing 0%  16. Walk outside on icy sidewalks 0%  Total: #/16 20/1600= 1.25%   BERG  1. SITTING TO STANDING  2 able to stand using hands after several tries   2. STANDING UNSUPPORTED 0 unable to stand 30 seconds unsupported  Required assistance with RW, able to stand unsupported for 18  If a subject is able to stand 2 minutes unsupported, score full points for sitting unsupported. Proceed to item #4  3. SITTING WITH BACK UNSUPPORTED BUT FEET SUPPORTED ON FLOOR OR ON A STOOL 4 able to sit safely and securely for 2 minutes  4. STANDING TO SITTING 2 uses back of legs against chair to control descent  5. TRANSFERS 1 needs one person to assist  6.  STANDING UNSUPPORTED WITH EYES CLOSED  3 able to stand 10 seconds with supervision  7. STANDING UNSUPPORTED WITH FEET TOGETHER 1 needs help to attain position but able to stand 15 seconds feet together  8. REACHING FORWARD WITH OUTSTRETCHED ARM WHILE STANDING 1 reaches forward but needs supervision  9. PICK UP OBJECT FROM THE FLOOR FROM A STANDING POSITION  3 able to pick up slipper but needs supervision  10. TURNING TO LOOK BEHIND OVER LEFT AND RIGHT  SHOULDERS WHILE STANDING 1 needs supervision when turning  11. TURN 360 DEGREES  0 needs assistance while turning  12. PLACE ALTERNATE FOOT ON STEP OR STOOL WHILE STANDING UNSUPPORTED  0 needs assistance to keep from falling/unable to try Able to tap Lt foot, increased difficulty with Rt, required RW and min A for sagety 13. STANDING UNSUPPORTED ONE FOOT IN FRONT 0 loses balance while stepping or standing  14. STANDING ON ONE LEG 0 unable to try of needs assist to prevent fall   TOTAL SCORE  18/56 0 -20 = wheelchair bound  08/28/24: ABC score: 80 / 1600 = 5.0 % BERG balance: 10 / 56 = 17.9 %                                                                                                                             TREATMENT DATE:  08/30/24: - Walking laps in // bars, 2 laps, required BUE support, Lt knee locking, increased difficulty with Lt foot clearance on 2nd lap - Sit to stand with UE support 5x  - standing without assistance for 2 minutes with each standing - Sidestep 3 steps each direction with BUE support, increased difficulty with Lt foot clearance upon return - Heel raises 15x  Seated: - LAQ with dorsiflexion  2x 5 - Toe raises 10x   08/28/2024  Progress note: goals tracked, BERG balance, 30 seconds STS, pt education on importance of HEP compliance and overall POC.  08/02/2024  Therapeutic Exercise: -Nustep, 5 minutes, level 4 resistance, seat 8 arms at 9, pt cued for 70-80 spm -Seated LAQ, 1 sets of 10 reps, pt cued for max knee extension -Supine bridges, 2 sets of 10 reps, pt cued for max ROM and smooth motion -Side lying clamshells, 1 set of 10 reps bilaterally, pt cued for sequencing Therapeutic Activity: -Walking laps in parallel bars, 1 lap, pt requires BUE support, L knee locking out this date -Standing heel raises, 1 sets of 10 reps, pt cued for increased hip flexion  -Sit to stands, 2 sets of 4 reps, pt cued for core activation and keeping weight close to  chest -Chair to chair transfer, x2, pt cued for decreased plopping, sequencing and requires mod assist for stand pivot transfer  07/28/2024  Therapeutic Exercise: -Nustep, 5 minutes, level 4 resistance, seat 8 arms at 9, pt cued for 70-80 spm -Seated LAQ, 1 sets of 10 reps, pt cued for max knee extension -Seated marching, 2 sets of  10 reps, 3lb ankle weights, pt cued for max ROM and smooth motion Neuromuscular Re-education: -Cone touches, 2 sets of 10 reps bilaterally, from 4 inch step in parallel bars, pt cued for increased hip flexion and to not kick cones over, CGA required Therapeutic Activity: -Walking laps in parallel bars, 1 lap, pt requires BUE support, L knee locking out this date -Standing dorsiflexion marches, 2 sets of 5 reps, pt cued for increased hip flexion  -Sit to stands, 2 sets of 4 reps, pt cued for core activation and keeping weight close to chest -Chair to chair transfer, x2, pt cued for decreased plopping, sequencing and requires mod assist for ambulation   PATIENT EDUCATION: Education details: Pt was educated on findings of PT evaluation, prognosis, frequency of therapy visits and rationale, attendance policy, and HEP if given.   Person educated: Patient and Biomedical Engineer Education method: Explanation, Verbal cues, and Handouts Education comprehension: verbalized understanding, verbal cues required, tactile cues required, and needs further education  HOME EXERCISE PROGRAM: Access Code: [email or web address] URL: [email or web address] Date: 08/28/2024 Prepared by: Lang Ada  Exercises - Supine Bridge  - 1 x daily - 7 x weekly - 3 sets - 10 reps - 3 hold - Supine Active Straight Leg Raise  - 1 x daily - 7 x weekly - 3 sets - 10 reps - Seated Long Arc Quad  - 1 x daily - 7 x weekly - 3 sets - 10 reps - Neutral Curl Up with Straight Leg  - 1 x daily - 7 x weekly - 3 sets - 10 reps - 3 hold - Seated Heel Toe Raises  - 1 x daily - 7 x weekly - 3 sets - 10  reps - Sit to Stand with Counter Support  - 2 x daily - 7 x weekly - 1 sets - 10 reps - Standing Marching With Four-Wheeled Walker  - 1 x daily - 7 x weekly - 1 sets - 5 reps - 120s hold  GOALS: Goals reviewed with patient? Yes  SHORT TERM GOALS: Target date: 07/12/24  Pt will be independent with HEP in order to demonstrate participation in Physical Therapy POC.  Baseline: Goal status: MET  2.  Pt will improve ABC score by 15% in order to demonstrate improved pain with functional goals and outcomes. Baseline: see objective Goal status: IN PROGRESS  LONG TERM GOALS: Target date: 08/02/24  Pt will increase BERG balance score by > 12.5 points in order to demonstrate improved functional safety and balance skills in ADL/mobility.   Baseline: see objective.  Goal status: IN PROGRESS  2.  Pt will improve 30 Second Chair Stand Test by 2 reps in order to demonstrate improved functional mobility capacity in community setting.  Baseline: see objective.  Goal status: IN PROGRESS  3.  Pt will improve ABC score by 30% in order to demonstrate improved pain with functional goals and outcomes. Baseline: see objective.  Goal status: IN PROGRESS  4.  Pt will improve chair to chair transfer with SBA in order to demonstrate improved capacity, safety, and overall quality of movement to optimize function.  Baseline: see objective.  Goal status: IN PROGRESS   ASSESSMENT:  CLINICAL IMPRESSION: Pt continues to demonstrate difficulty with functional mobility, decreased LE/ core strengthening, gait quality and balance.  Pt does present with improved activity tolerance today with ability to ambulate 2 laps inside parallel bars and ability to stand for 2 minutes without HHA following each sit to stand.  Continues to  require cueing to improve eccentric control with sit to stands.  Added sidestepping this session with increased difficulty with Lt foot returns due to fatigue.  Seated exercises complete with  cueing for full range and to cueing to improve dorsiflexion.  Pt limited by fatigue levels 3-4/10 at EOS, no reports of pain through session.   Eval:  Patient is a 59 y.o. 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 Patient demonstrates decreased UE/LE strength, abnormal pain with abduction and flexion with R shoulder, impaired functional mobility and impaired balance. Patient also demonstrates difficulty with chair to chair transfer with need for mod assist for standing and initiation of LLE movement during weight bearing. Patient also demonstrates decreased standing balance with inability to stand without hand held assist. Pt modified independent with bed mobility with scooting and supine to sit and sit to supine with increased time required for movents. Patient requires education on role of PT, fall risk, increased physical activity, HEP compliance and overall safety recommendations. Patient would benefit from skilled physical therapy for increased endurance with functional mobility, increased LE/UE strength, increased independence with functional transfers and improved balance for improved gait quality, return to higher level of function with ADLs, and progress towards therapy goals.   OBJECTIVE IMPAIRMENTS: Abnormal gait, decreased activity tolerance, decreased balance, decreased coordination, decreased endurance, decreased mobility, difficulty walking, decreased strength, impaired flexibility, postural dysfunction, and pain.   ACTIVITY LIMITATIONS: carrying, lifting, bending, standing, squatting, stairs, transfers, and bed mobility  PARTICIPATION LIMITATIONS: meal prep, cleaning, laundry, shopping, community activity, and yard work  PERSONAL FACTORS: Age, Fitness, Past/current experiences, Time since onset of injury/illness/exacerbation, and 1 comorbidity: MS  are also affecting patient's functional outcome.   REHAB POTENTIAL: Fair neurologic pathology  CLINICAL DECISION MAKING: Evolving/moderate complexity  EVALUATION COMPLEXITY: Moderate  PLAN:  PT FREQUENCY: 1-2x/week  PT DURATION: 6 weeks  PLANNED INTERVENTIONS: 97110-Therapeutic exercises, 97530- Therapeutic activity, [id number]- Neuromuscular re-education, 97535- Self Care, [id number]- Manual therapy, [id number]- Gait training, Patient/Family education, Balance training, Stair training, DME instructions, Cryotherapy, and Moist heat  PLAN FOR NEXT SESSION: Progress core and extremity strengthening, progress bed mobility and functional mobility. Extend for 1-2 times per week for 6 weeks.  Augustin Mclean, LPTA/CLT; CBIS [phone]  2:54 PM, 08/30/2024          "

## 2024-09-01 ENCOUNTER — Ambulatory Visit (HOSPITAL_COMMUNITY)

## 2024-09-08 ENCOUNTER — Encounter (HOSPITAL_COMMUNITY): Payer: Self-pay

## 2024-09-08 ENCOUNTER — Ambulatory Visit (HOSPITAL_COMMUNITY)

## 2024-09-08 DIAGNOSIS — G35C0 Secondary progressive multiple sclerosis, unspecified: Secondary | ICD-10-CM

## 2024-09-08 DIAGNOSIS — R29898 Other symptoms and signs involving the musculoskeletal system: Secondary | ICD-10-CM

## 2024-09-08 DIAGNOSIS — Z7409 Other reduced mobility: Secondary | ICD-10-CM

## 2024-09-08 DIAGNOSIS — Z9181 History of falling: Secondary | ICD-10-CM

## 2024-09-13 ENCOUNTER — Ambulatory Visit (HOSPITAL_COMMUNITY)

## 2024-09-15 ENCOUNTER — Ambulatory Visit (HOSPITAL_COMMUNITY)

## 2024-09-20 ENCOUNTER — Ambulatory Visit (HOSPITAL_COMMUNITY): Admitting: Physical Therapy

## 2024-09-22 ENCOUNTER — Ambulatory Visit (HOSPITAL_COMMUNITY)

## 2024-09-27 ENCOUNTER — Ambulatory Visit (HOSPITAL_COMMUNITY)

## 2024-09-29 ENCOUNTER — Ambulatory Visit (HOSPITAL_COMMUNITY)

## 2024-10-04 ENCOUNTER — Ambulatory Visit (HOSPITAL_COMMUNITY): Admitting: Physical Therapy

## 2024-10-06 ENCOUNTER — Ambulatory Visit (HOSPITAL_COMMUNITY)

## 2024-10-11 ENCOUNTER — Ambulatory Visit (HOSPITAL_COMMUNITY)

## 2024-10-13 ENCOUNTER — Ambulatory Visit (HOSPITAL_COMMUNITY)

## 2024-10-18 ENCOUNTER — Ambulatory Visit (HOSPITAL_COMMUNITY)

## 2025-06-18 ENCOUNTER — Ambulatory Visit: Admitting: Family Medicine
